# Patient Record
Sex: Male | Born: 1950 | Race: Black or African American | Hispanic: No | Marital: Single | Smoking: Former smoker
Health system: Southern US, Community
[De-identification: ages and names within clinical notes are randomized; demographics above are authoritative.]

## PROBLEM LIST (undated history)

## (undated) DIAGNOSIS — J189 Pneumonia, unspecified organism: Secondary | ICD-10-CM

## (undated) DIAGNOSIS — D75A Glucose-6-phosphate dehydrogenase (G6PD) deficiency without anemia: Secondary | ICD-10-CM

## (undated) DIAGNOSIS — Z8679 Personal history of other diseases of the circulatory system: Secondary | ICD-10-CM

## (undated) DIAGNOSIS — R569 Unspecified convulsions: Secondary | ICD-10-CM

## (undated) DIAGNOSIS — C3490 Malignant neoplasm of unspecified part of unspecified bronchus or lung: Secondary | ICD-10-CM

## (undated) DIAGNOSIS — I4510 Unspecified right bundle-branch block: Secondary | ICD-10-CM

## (undated) HISTORY — PX: APPENDECTOMY: SHX54

## (undated) HISTORY — DX: Personal history of other diseases of the circulatory system: Z86.79

## (undated) HISTORY — DX: Pneumonia, unspecified organism: J18.9

## (undated) HISTORY — DX: Unspecified convulsions: R56.9

## (undated) HISTORY — DX: Unspecified right bundle-branch block: I45.10

---

## 2005-06-06 ENCOUNTER — Emergency Department (HOSPITAL_COMMUNITY): Admission: EM | Admit: 2005-06-06 | Discharge: 2005-06-06 | Payer: Self-pay | Admitting: *Deleted

## 2005-11-09 ENCOUNTER — Ambulatory Visit: Payer: Self-pay | Admitting: Internal Medicine

## 2005-11-09 ENCOUNTER — Emergency Department (HOSPITAL_COMMUNITY): Admission: EM | Admit: 2005-11-09 | Discharge: 2005-11-09 | Payer: Self-pay | Admitting: Emergency Medicine

## 2005-11-14 ENCOUNTER — Ambulatory Visit (HOSPITAL_COMMUNITY): Admission: RE | Admit: 2005-11-14 | Discharge: 2005-11-14 | Payer: Self-pay | Admitting: Internal Medicine

## 2005-11-27 ENCOUNTER — Ambulatory Visit: Payer: Self-pay | Admitting: Internal Medicine

## 2005-12-15 ENCOUNTER — Emergency Department (HOSPITAL_COMMUNITY): Admission: EM | Admit: 2005-12-15 | Discharge: 2005-12-16 | Payer: Self-pay | Admitting: Emergency Medicine

## 2006-07-09 ENCOUNTER — Emergency Department (HOSPITAL_COMMUNITY): Admission: EM | Admit: 2006-07-09 | Discharge: 2006-07-09 | Payer: Self-pay | Admitting: Family Medicine

## 2006-07-18 ENCOUNTER — Emergency Department (HOSPITAL_COMMUNITY): Admission: EM | Admit: 2006-07-18 | Discharge: 2006-07-18 | Payer: Self-pay | Admitting: Emergency Medicine

## 2008-02-05 ENCOUNTER — Emergency Department (HOSPITAL_COMMUNITY): Admission: EM | Admit: 2008-02-05 | Discharge: 2008-02-05 | Payer: Self-pay | Admitting: Emergency Medicine

## 2008-03-17 ENCOUNTER — Emergency Department (HOSPITAL_COMMUNITY): Admission: EM | Admit: 2008-03-17 | Discharge: 2008-03-17 | Payer: Self-pay | Admitting: Emergency Medicine

## 2008-04-01 ENCOUNTER — Emergency Department (HOSPITAL_COMMUNITY): Admission: EM | Admit: 2008-04-01 | Discharge: 2008-04-01 | Payer: Self-pay | Admitting: Family Medicine

## 2008-05-12 HISTORY — PX: US ECHOCARDIOGRAPHY: HXRAD669

## 2009-10-25 ENCOUNTER — Emergency Department (HOSPITAL_COMMUNITY): Admission: EM | Admit: 2009-10-25 | Discharge: 2009-10-25 | Payer: Self-pay | Admitting: Emergency Medicine

## 2009-11-18 ENCOUNTER — Emergency Department (HOSPITAL_COMMUNITY): Admission: EM | Admit: 2009-11-18 | Discharge: 2009-11-18 | Payer: Self-pay | Admitting: Emergency Medicine

## 2010-09-03 ENCOUNTER — Emergency Department (HOSPITAL_COMMUNITY): Admission: EM | Admit: 2010-09-03 | Discharge: 2010-09-03 | Payer: Self-pay | Admitting: Emergency Medicine

## 2010-11-13 ENCOUNTER — Emergency Department (HOSPITAL_COMMUNITY)
Admission: EM | Admit: 2010-11-13 | Discharge: 2010-11-13 | Payer: Self-pay | Source: Home / Self Care | Admitting: Emergency Medicine

## 2011-01-10 LAB — DIFFERENTIAL
Basophils Absolute: 0 10*3/uL (ref 0.0–0.1)
Basophils Relative: 0 % (ref 0–1)
Eosinophils Absolute: 0.1 10*3/uL (ref 0.0–0.7)
Neutro Abs: 3.4 10*3/uL (ref 1.7–7.7)
Neutrophils Relative %: 57 % (ref 43–77)

## 2011-01-10 LAB — CULTURE, ROUTINE-ABSCESS

## 2011-01-10 LAB — CBC
MCH: 34.4 pg — ABNORMAL HIGH (ref 26.0–34.0)
MCHC: 33.9 g/dL (ref 30.0–36.0)
Platelets: 165 10*3/uL (ref 150–400)

## 2011-01-10 LAB — URINALYSIS, ROUTINE W REFLEX MICROSCOPIC
Nitrite: NEGATIVE
Protein, ur: NEGATIVE mg/dL
Urobilinogen, UA: 2 mg/dL — ABNORMAL HIGH (ref 0.0–1.0)

## 2011-01-10 LAB — BASIC METABOLIC PANEL
CO2: 23 mEq/L (ref 19–32)
Calcium: 9.3 mg/dL (ref 8.4–10.5)
Creatinine, Ser: 0.97 mg/dL (ref 0.4–1.5)
GFR calc Af Amer: >60 mL/min (ref >60)
GFR calc non Af Amer: >60 mL/min (ref >60)
Glucose, Bld: 108 mg/dL — ABNORMAL HIGH (ref 70–99)

## 2011-01-10 LAB — URINE MICROSCOPIC-ADD ON

## 2011-02-23 ENCOUNTER — Emergency Department (HOSPITAL_COMMUNITY): Payer: Medicaid Other

## 2011-02-23 ENCOUNTER — Observation Stay (HOSPITAL_COMMUNITY)
Admission: EM | Admit: 2011-02-23 | Discharge: 2011-02-24 | Discharge status: Against Medical Advice | Payer: Medicaid Other | Source: Ambulatory Visit | Attending: Internal Medicine | Admitting: Internal Medicine

## 2011-02-23 DIAGNOSIS — E538 Deficiency of other specified B group vitamins: Secondary | ICD-10-CM | POA: Insufficient documentation

## 2011-02-23 DIAGNOSIS — K219 Gastro-esophageal reflux disease without esophagitis: Secondary | ICD-10-CM | POA: Insufficient documentation

## 2011-02-23 DIAGNOSIS — R112 Nausea with vomiting, unspecified: Secondary | ICD-10-CM | POA: Insufficient documentation

## 2011-02-23 DIAGNOSIS — R0789 Other chest pain: Principal | ICD-10-CM | POA: Insufficient documentation

## 2011-02-23 DIAGNOSIS — F1210 Cannabis abuse, uncomplicated: Secondary | ICD-10-CM | POA: Insufficient documentation

## 2011-02-23 DIAGNOSIS — E7400 Glycogen storage disease, unspecified: Secondary | ICD-10-CM | POA: Insufficient documentation

## 2011-02-23 DIAGNOSIS — M25519 Pain in unspecified shoulder: Secondary | ICD-10-CM | POA: Insufficient documentation

## 2011-02-23 DIAGNOSIS — F17200 Nicotine dependence, unspecified, uncomplicated: Secondary | ICD-10-CM | POA: Insufficient documentation

## 2011-02-23 LAB — LIPID PANEL
Cholesterol: 130 mg/dL (ref 0–200)
HDL: 26 mg/dL — ABNORMAL LOW (ref >39)
Triglycerides: 123 mg/dL (ref <150)

## 2011-02-23 LAB — DIFFERENTIAL
Basophils Absolute: 0 10*3/uL (ref 0.0–0.1)
Basophils Relative: 1 % (ref 0–1)
Monocytes Absolute: 0.5 10*3/uL (ref 0.1–1.0)
Neutro Abs: 3.6 10*3/uL (ref 1.7–7.7)

## 2011-02-23 LAB — COMPREHENSIVE METABOLIC PANEL
BUN: 11 mg/dL (ref 6–23)
Calcium: 9.2 mg/dL (ref 8.4–10.5)
Glucose, Bld: 109 mg/dL — ABNORMAL HIGH (ref 70–99)
Sodium: 136 mEq/L (ref 135–145)
Total Protein: 7.1 g/dL (ref 6.0–8.3)

## 2011-02-23 LAB — RAPID URINE DRUG SCREEN, HOSP PERFORMED
Amphetamines: NOT DETECTED
Benzodiazepines: NOT DETECTED
Cocaine: NOT DETECTED
Opiates: NOT DETECTED
Tetrahydrocannabinol: POSITIVE — AB

## 2011-02-23 LAB — CBC
Hemoglobin: 13.1 g/dL (ref 13.0–17.0)
MCHC: 34.3 g/dL (ref 30.0–36.0)
RDW: 12.5 % (ref 11.5–15.5)

## 2011-02-23 LAB — CARDIAC PANEL(CRET KIN+CKTOT+MB+TROPI)
CK, MB: 1.6 ng/mL (ref 0.3–4.0)
CK, MB: 1.5 ng/mL (ref 0.3–4.0)
Relative Index: INVALID (ref 0.0–2.5)

## 2011-02-23 LAB — POCT CARDIAC MARKERS
CKMB, poc: 1.8 ng/mL (ref 1.0–8.0)
Myoglobin, poc: 58.1 ng/mL (ref 12–200)
Troponin i, poc: <0.05 ng/mL (ref 0.00–0.09)

## 2011-02-23 LAB — URINALYSIS, ROUTINE W REFLEX MICROSCOPIC
Bilirubin Urine: NEGATIVE
Glucose, UA: NEGATIVE mg/dL
Ketones, ur: NEGATIVE mg/dL
pH: 8 (ref 5.0–8.0)

## 2011-02-23 LAB — PROTIME-INR
INR: 1.03 (ref 0.00–1.49)
Prothrombin Time: 13.7 seconds (ref 11.6–15.2)

## 2011-02-23 LAB — URINE MICROSCOPIC-ADD ON

## 2011-02-24 ENCOUNTER — Inpatient Hospital Stay (HOSPITAL_COMMUNITY): Payer: Medicaid Other

## 2011-02-24 LAB — URINE CULTURE: Culture  Setup Time: 201204261401

## 2011-02-24 LAB — CARDIAC PANEL(CRET KIN+CKTOT+MB+TROPI)
CK, MB: 1.2 ng/mL (ref 0.3–4.0)
Total CK: 82 U/L (ref 7–232)

## 2011-02-24 MED ORDER — TECHNETIUM TC 99M TETROFOSMIN IV KIT
10.0000 | PACK | Freq: Once | INTRAVENOUS | Status: AC | PRN
  Administered 2011-02-24: 10 via INTRAVENOUS

## 2011-02-24 MED ORDER — TECHNETIUM TC 99M TETROFOSMIN IV KIT
30.0000 | PACK | Freq: Once | INTRAVENOUS | Status: AC | PRN
  Administered 2011-02-24: 30 via INTRAVENOUS

## 2011-02-25 ENCOUNTER — Inpatient Hospital Stay (HOSPITAL_COMMUNITY): Payer: Medicaid Other

## 2011-03-01 NOTE — H&P (Signed)
Joshua, White                  ACCOUNT NO.:  [id number]  MEDICAL RECORD NO.:  [id number]           PATIENT TYPE:  I  LOCATION:  3705                         FACILITY:  [facility]  PHYSICIAN:  Wilson Singer, M.D.DATE OF BIRTH:  09/30/1951  DATE OF ADMISSION:  02/23/2011 DATE OF DISCHARGE:                             HISTORY & PHYSICAL   CHIEF COMPLAINT:  Left-sided chest tightness.  HISTORY OF PRESENT ILLNESS:  This 60 year old man comes to the emergency room with approximately a 4-hour history of left-sided chest tightness associated with nausea and sweating.  He also complained of left arm pain and left shoulder pain.  The left shoulder pain he actually has had for the last 2 or 3 days and he relates this to possible injury to the left shoulder, but he is not completely certain of this.  He does not have a previous history of coronary artery disease.  He does have a strong family history of early coronary artery disease with his father having died from myocardial infarction at the age of 42.  He is a smoker.  He does not know where his cholesterol is.  He is not diabetic. Currently, he is pain free after he was given nitroglycerin.  Emergency room has asked [place] to evaluate him further.  PAST MEDICAL HISTORY: 1. G6PD deficiency. 2. Gastroesophageal reflux disease. 3. Vitamin B12 deficiency.  MEDICATIONS:  He is on omeprazole 40 mg daily, vitamin C 1 tablet daily, vitamin B12 one tablet daily, multivitamins 1 tablet daily, fish oil 1 capsule three times a day.  ALLERGIES:  No known drug allergies.  PAST SURGICAL HISTORY:  Appendectomy.  SOCIAL HISTORY:  The patient is single and lives alone.  He smokes 4 cigarettes a day and he is trying to quit.  He also has a history of cannabis abuse and he admits to this freely.  He does not drink alcohol. He has been on disability from his G6PD deficiency for many years.  FAMILY HISTORY:  Father died of a myocardial infarction at  the age of 77.  He has a brother who has a pacemaker inserted, but he is not sure whether he did have any coronary artery disease.  REVIEW OF SYSTEMS:  Apart from the symptoms mentioned above, there are no other symptoms referable to all systems reviewed.  PHYSICAL EXAMINATION:  VITAL SIGNS:  Temperature 97.9, blood pressure 142/99, pulse 75, saturation 100% on room air, respiratory rate 12 to 14. GENERAL:  He looks systemically well.  He is not in any acute distress. There is no peripheral or central cyanosis.  There is no clubbing. There is no jaundice. CARDIOVASCULAR:  Heart sounds are present and normal.  There is no gallop rhythm. LUNGS:  Lung fields are clear without any pericardial or pleural rub. ABDOMEN:  Soft and nontender with no hepatosplenomegaly. NEUROLOGICAL:  He is alert and oriented without any focal neurologic signs. SKIN:  There are no obvious skin lesions or rash.  INVESTIGATIONS:  Chest x-ray is largely negative with no evidence of acute cardiopulmonary disease.  Hemoglobin 13.1 with an MCV of 100.8, white blood cell  count 5.6, platelets 143.  Sodium 136, potassium 4.2, bicarbonate 27, BUN 11, creatinine 0.91, INR 1.03.  Urinalysis shows small amount of leukocytes and negative for nitrites.  Initial cardiac markers x2 have been negative.  PROBLEM LIST: 1. Atypical chest tightness/pain. 2. Left shoulder pain. 3. G6PD deficiency. 4. Strong family history of early coronary artery disease. 5. Tobacco and cannabis abuse.  PLAN: 1. Admit to telemetry. 2. Serial cardiac enzymes and ECG. 3. Cardiology consultation. 4. X-ray of the left shoulder 5. Urine drug screen.  I have spoken with Dr. Sharyn Lull, cardiologist who will see him in due course and further recommendations will depend on hospital progress.     Wilson Singer, M.D.     NCG/MEDQ  D:  02/23/2011  T:  02/24/2011  Job:  [id number]  cc:   Maryelizabeth Rowan, M.D. Eduardo Osier. Sharyn Lull,  M.D.  Electronically Signed by Lilly Cove M.D. on 03/01/2011 12:22:33 PM

## 2011-03-22 NOTE — Discharge Summary (Signed)
  NAMECLEBURN, MAIOLO                  ACCOUNT NO.:  [id number]  MEDICAL RECORD NO.:  [id number]           PATIENT TYPE:  I  LOCATION:  3705                         FACILITY:  [facility]  PHYSICIAN:  Hobie Kohles I Micholas Drumwright, MD      DATE OF BIRTH:  08/10/1951  DATE OF ADMISSION:  02/23/2011 DATE OF DISCHARGE:  02/24/2011                              DISCHARGE SUMMARY   DIAGNOSES: 1. Atypical chest pain. 2. History of G6PD deficiency. 3. Gastroesophageal reflux deficiency. 4. Vitamin B12 deficiency.  MEDICATIONS:  The patient signed AMA before the official discharge.  PROCEDURES: 1. Stress test.  No evidence of pharmacological-induced ischemia.     Ejection fraction 58%.  Shoulder x-ray, no evidence of fracture or     dislocation. 2. Chest x-ray low volume without acute cardiopulmonary disease.  HISTORY OF PRESENT ILLNESS:  This 60 year old male presented to the emergency room with approximately 4 hour history of left-sided chest tightness associated with nausea and sweating.  He had complained of left arm pain and left shoulder pain.  The left shoulder pain has actually he had for the last 2-3 days and he had related this also to injury to his left shoulder.  In the emergency room, the patient has vital sign of 97.7, blood pressure 142 and pulse rate 75.  Chest x-ray no evidence of acute cardiopulmonary process.  The patient admitted to telemetry and had Cardiology consult done by Dr. Sharyn Lull who had recommended stress test.  The patient underwent a stress test, report as where mentioned negative.  Actually, the patient signed against medical advice before official discharge.     Bence Trapp Bosie Helper, MD     HIE/MEDQ  D:  02/26/2011  T:  02/26/2011  Job:  [id number]  Electronically Signed by Ebony Cargo MD on 03/22/2011 03:53:19 PM

## 2011-03-31 ENCOUNTER — Emergency Department (HOSPITAL_COMMUNITY)
Admission: EM | Admit: 2011-03-31 | Discharge: 2011-03-31 | Disposition: A | Discharge status: Home / Self Care | Payer: Medicaid Other | Attending: Emergency Medicine | Admitting: Emergency Medicine

## 2011-03-31 DIAGNOSIS — Z9889 Other specified postprocedural states: Secondary | ICD-10-CM | POA: Insufficient documentation

## 2011-03-31 DIAGNOSIS — L738 Other specified follicular disorders: Secondary | ICD-10-CM | POA: Insufficient documentation

## 2011-03-31 DIAGNOSIS — M79609 Pain in unspecified limb: Secondary | ICD-10-CM | POA: Insufficient documentation

## 2011-03-31 DIAGNOSIS — M7989 Other specified soft tissue disorders: Secondary | ICD-10-CM | POA: Insufficient documentation

## 2011-03-31 DIAGNOSIS — Z79899 Other long term (current) drug therapy: Secondary | ICD-10-CM | POA: Insufficient documentation

## 2011-03-31 DIAGNOSIS — D551 Anemia due to other disorders of glutathione metabolism: Secondary | ICD-10-CM | POA: Insufficient documentation

## 2011-03-31 DIAGNOSIS — K219 Gastro-esophageal reflux disease without esophagitis: Secondary | ICD-10-CM | POA: Insufficient documentation

## 2011-04-11 ENCOUNTER — Ambulatory Visit: Payer: Medicaid Other | Attending: Family Medicine | Admitting: Physical Therapy

## 2011-04-11 DIAGNOSIS — R5381 Other malaise: Secondary | ICD-10-CM | POA: Insufficient documentation

## 2011-04-11 DIAGNOSIS — M25519 Pain in unspecified shoulder: Secondary | ICD-10-CM | POA: Insufficient documentation

## 2011-04-11 DIAGNOSIS — IMO0001 Reserved for inherently not codable concepts without codable children: Secondary | ICD-10-CM | POA: Insufficient documentation

## 2011-04-11 DIAGNOSIS — M25619 Stiffness of unspecified shoulder, not elsewhere classified: Secondary | ICD-10-CM | POA: Insufficient documentation

## 2011-04-25 ENCOUNTER — Observation Stay (HOSPITAL_COMMUNITY)
Admission: EM | Admit: 2011-04-25 | Discharge: 2011-04-25 | Disposition: A | Discharge status: Home / Self Care | Payer: Medicaid Other | Source: Ambulatory Visit | Attending: Emergency Medicine | Admitting: Emergency Medicine

## 2011-04-25 DIAGNOSIS — T781XXA Other adverse food reactions, not elsewhere classified, initial encounter: Principal | ICD-10-CM | POA: Insufficient documentation

## 2011-04-25 DIAGNOSIS — Z91011 Allergy to milk products: Secondary | ICD-10-CM | POA: Insufficient documentation

## 2011-04-25 DIAGNOSIS — R0602 Shortness of breath: Secondary | ICD-10-CM | POA: Insufficient documentation

## 2011-04-26 ENCOUNTER — Ambulatory Visit: Payer: Medicaid Other | Admitting: Physical Therapy

## 2011-05-10 ENCOUNTER — Ambulatory Visit: Payer: Medicaid Other | Attending: Family Medicine | Admitting: Physical Therapy

## 2011-05-10 DIAGNOSIS — M25519 Pain in unspecified shoulder: Secondary | ICD-10-CM | POA: Insufficient documentation

## 2011-05-10 DIAGNOSIS — M25619 Stiffness of unspecified shoulder, not elsewhere classified: Secondary | ICD-10-CM | POA: Insufficient documentation

## 2011-05-10 DIAGNOSIS — IMO0001 Reserved for inherently not codable concepts without codable children: Secondary | ICD-10-CM | POA: Insufficient documentation

## 2011-05-10 DIAGNOSIS — R5381 Other malaise: Secondary | ICD-10-CM | POA: Insufficient documentation

## 2011-05-17 ENCOUNTER — Ambulatory Visit: Payer: Medicaid Other | Admitting: Physical Therapy

## 2011-05-24 ENCOUNTER — Encounter: Payer: Medicaid Other | Admitting: Physical Therapy

## 2011-07-26 LAB — URINALYSIS, ROUTINE W REFLEX MICROSCOPIC
Ketones, ur: NEGATIVE
Nitrite: NEGATIVE
Protein, ur: NEGATIVE
Urobilinogen, UA: 1

## 2011-07-26 LAB — HEPATIC FUNCTION PANEL
Alkaline Phosphatase: 70
Bilirubin, Direct: 0.3
Indirect Bilirubin: 1.3 — ABNORMAL HIGH
Total Bilirubin: 1.6 — ABNORMAL HIGH
Total Protein: 7.2

## 2011-07-26 LAB — POCT I-STAT, CHEM 8
BUN: 14
Calcium, Ion: 0.96 — ABNORMAL LOW
Chloride: 104
Glucose, Bld: 97

## 2011-07-26 LAB — CBC
HCT: 40.3
MCV: 103.1 — ABNORMAL HIGH
Platelets: 161
WBC: 6.4

## 2011-07-26 LAB — DIFFERENTIAL
Eosinophils Absolute: 0.1
Eosinophils Relative: 1
Lymphs Abs: 1.7

## 2011-09-13 ENCOUNTER — Encounter: Payer: Self-pay | Admitting: *Deleted

## 2011-09-13 ENCOUNTER — Emergency Department (HOSPITAL_COMMUNITY)
Admission: EM | Admit: 2011-09-13 | Discharge: 2011-09-13 | Discharge status: Against Medical Advice | Payer: Medicaid Other | Attending: Emergency Medicine | Admitting: Emergency Medicine

## 2011-09-13 DIAGNOSIS — R112 Nausea with vomiting, unspecified: Secondary | ICD-10-CM | POA: Insufficient documentation

## 2011-09-13 DIAGNOSIS — R197 Diarrhea, unspecified: Secondary | ICD-10-CM | POA: Insufficient documentation

## 2011-09-13 HISTORY — DX: Glucose-6-phosphate dehydrogenase (G6PD) deficiency without anemia: D75.A

## 2011-09-13 NOTE — ED Notes (Signed)
Pt reports not eating, n/v./d x 4 days. Also having abcess x 2 which is causing pain.

## 2012-01-08 ENCOUNTER — Encounter: Payer: Self-pay | Admitting: *Deleted

## 2012-01-08 ENCOUNTER — Other Ambulatory Visit: Payer: Self-pay | Admitting: *Deleted

## 2012-11-27 ENCOUNTER — Other Ambulatory Visit (HOSPITAL_COMMUNITY): Payer: Self-pay | Admitting: Family Medicine

## 2012-11-27 DIAGNOSIS — R55 Syncope and collapse: Secondary | ICD-10-CM

## 2012-12-05 ENCOUNTER — Ambulatory Visit (HOSPITAL_COMMUNITY)
Admission: RE | Admit: 2012-12-05 | Discharge: 2012-12-05 | Disposition: A | Discharge status: Home / Self Care | Payer: Medicaid Other | Source: Ambulatory Visit | Attending: Cardiovascular Disease | Admitting: Cardiovascular Disease

## 2012-12-05 DIAGNOSIS — R55 Syncope and collapse: Secondary | ICD-10-CM | POA: Insufficient documentation

## 2012-12-05 DIAGNOSIS — I369 Nonrheumatic tricuspid valve disorder, unspecified: Secondary | ICD-10-CM | POA: Insufficient documentation

## 2012-12-05 DIAGNOSIS — I059 Rheumatic mitral valve disease, unspecified: Secondary | ICD-10-CM | POA: Insufficient documentation

## 2012-12-05 NOTE — Progress Notes (Signed)
2D Echo Performed 12/05/2012    Evely Gainey, RCS  

## 2012-12-05 NOTE — Progress Notes (Signed)
Carotid Duplex Imaging Complete Vickie Ponds 

## 2012-12-06 NOTE — Progress Notes (Signed)
Carotid Duplex Completed. 

## 2013-01-31 ENCOUNTER — Encounter: Payer: Self-pay | Admitting: Cardiology

## 2013-05-05 ENCOUNTER — Emergency Department (HOSPITAL_COMMUNITY)
Admission: EM | Admit: 2013-05-05 | Discharge: 2013-05-05 | Discharge status: Against Medical Advice | Payer: Medicaid Other | Attending: Emergency Medicine | Admitting: Emergency Medicine

## 2013-05-05 DIAGNOSIS — J3489 Other specified disorders of nose and nasal sinuses: Secondary | ICD-10-CM | POA: Insufficient documentation

## 2013-05-05 DIAGNOSIS — H9209 Otalgia, unspecified ear: Secondary | ICD-10-CM | POA: Insufficient documentation

## 2013-05-05 DIAGNOSIS — R234 Changes in skin texture: Secondary | ICD-10-CM | POA: Insufficient documentation

## 2013-05-05 DIAGNOSIS — IMO0002 Reserved for concepts with insufficient information to code with codable children: Secondary | ICD-10-CM | POA: Insufficient documentation

## 2013-05-05 NOTE — ED Notes (Signed)
Presents with left posterior forearm induration that began 4 days ago. Pt also reports having flu like symptoms the last 4 days as well. He reports right ear pain and radiation to right neck and right eye and sinus pressure. Pt is alert and oriented.

## 2013-06-23 ENCOUNTER — Ambulatory Visit
Admission: RE | Admit: 2013-06-23 | Discharge: 2013-06-23 | Disposition: A | Discharge status: Home / Self Care | Payer: Medicaid Other | Source: Ambulatory Visit | Attending: Family Medicine | Admitting: Family Medicine

## 2013-06-23 ENCOUNTER — Other Ambulatory Visit: Payer: Self-pay | Admitting: Family Medicine

## 2013-06-23 DIAGNOSIS — M25511 Pain in right shoulder: Secondary | ICD-10-CM

## 2014-05-29 ENCOUNTER — Emergency Department (HOSPITAL_COMMUNITY)
Admission: EM | Admit: 2014-05-29 | Discharge: 2014-05-29 | Discharge status: Against Medical Advice | Payer: Medicaid Other | Attending: Emergency Medicine | Admitting: Emergency Medicine

## 2014-05-29 ENCOUNTER — Encounter (HOSPITAL_COMMUNITY): Payer: Self-pay | Admitting: Emergency Medicine

## 2014-05-29 DIAGNOSIS — Y9289 Other specified places as the place of occurrence of the external cause: Secondary | ICD-10-CM | POA: Diagnosis not present

## 2014-05-29 DIAGNOSIS — IMO0002 Reserved for concepts with insufficient information to code with codable children: Secondary | ICD-10-CM | POA: Diagnosis not present

## 2014-05-29 DIAGNOSIS — Z862 Personal history of diseases of the blood and blood-forming organs and certain disorders involving the immune mechanism: Secondary | ICD-10-CM | POA: Diagnosis not present

## 2014-05-29 DIAGNOSIS — R51 Headache: Secondary | ICD-10-CM | POA: Insufficient documentation

## 2014-05-29 DIAGNOSIS — Z8669 Personal history of other diseases of the nervous system and sense organs: Secondary | ICD-10-CM | POA: Diagnosis not present

## 2014-05-29 DIAGNOSIS — L089 Local infection of the skin and subcutaneous tissue, unspecified: Secondary | ICD-10-CM

## 2014-05-29 DIAGNOSIS — Y9389 Activity, other specified: Secondary | ICD-10-CM | POA: Insufficient documentation

## 2014-05-29 DIAGNOSIS — Z79899 Other long term (current) drug therapy: Secondary | ICD-10-CM | POA: Insufficient documentation

## 2014-05-29 DIAGNOSIS — S0100XA Unspecified open wound of scalp, initial encounter: Secondary | ICD-10-CM | POA: Insufficient documentation

## 2014-05-29 DIAGNOSIS — I951 Orthostatic hypotension: Secondary | ICD-10-CM | POA: Diagnosis not present

## 2014-05-29 DIAGNOSIS — R519 Headache, unspecified: Secondary | ICD-10-CM

## 2014-05-29 DIAGNOSIS — Z23 Encounter for immunization: Secondary | ICD-10-CM | POA: Diagnosis not present

## 2014-05-29 DIAGNOSIS — S0990XA Unspecified injury of head, initial encounter: Secondary | ICD-10-CM | POA: Diagnosis not present

## 2014-05-29 DIAGNOSIS — Z8701 Personal history of pneumonia (recurrent): Secondary | ICD-10-CM | POA: Diagnosis not present

## 2014-05-29 DIAGNOSIS — T148XXA Other injury of unspecified body region, initial encounter: Secondary | ICD-10-CM

## 2014-05-29 MED ORDER — METOCLOPRAMIDE HCL 5 MG/ML IJ SOLN
10.0000 mg | Freq: Once | INTRAMUSCULAR | Status: DC
  Filled 2014-05-29: qty 2

## 2014-05-29 MED ORDER — DIPHENHYDRAMINE HCL 50 MG/ML IJ SOLN
25.0000 mg | Freq: Once | INTRAMUSCULAR | Status: DC
  Filled 2014-05-29: qty 1

## 2014-05-29 MED ORDER — CEPHALEXIN 500 MG PO CAPS
500.0000 mg | ORAL_CAPSULE | Freq: Four times a day (QID) | ORAL | Status: DC
Start: 2014-05-29 — End: 2016-10-17

## 2014-05-29 MED ORDER — NAPROXEN 500 MG PO TABS
500.0000 mg | ORAL_TABLET | Freq: Two times a day (BID) | ORAL | Status: DC
Start: 2014-05-29 — End: 2016-12-26

## 2014-05-29 MED ORDER — CEFTRIAXONE SODIUM 1 G IJ SOLR
1.0000 g | Freq: Once | INTRAMUSCULAR | Status: DC
  Filled 2014-05-29: qty 10

## 2014-05-29 MED ORDER — SODIUM CHLORIDE 0.9 % IV BOLUS (SEPSIS)
1000.0000 mL | Freq: Once | INTRAVENOUS | Status: AC
  Administered 2014-05-29: 1000 mL via INTRAVENOUS

## 2014-05-29 MED ORDER — TETANUS-DIPHTH-ACELL PERTUSSIS 5-2.5-18.5 LF-MCG/0.5 IM SUSP
0.5000 mL | Freq: Once | INTRAMUSCULAR | Status: AC
  Administered 2014-05-29: 0.5 mL via INTRAMUSCULAR
  Filled 2014-05-29: qty 0.5

## 2014-05-29 NOTE — ED Provider Notes (Signed)
Medical screening examination/treatment/procedure(s) were performed by non-physician practitioner and as supervising physician I was immediately available for consultation/collaboration.   EKG Interpretation None        Blanchard Kelch, MD 05/29/14 [phone]

## 2014-05-29 NOTE — ED Notes (Signed)
Pt leaving AMA, will not give reason, just states he wants to go. PA explained risks of leaving and pt verbalized understanding.

## 2014-05-29 NOTE — Discharge Instructions (Signed)
Your leaving the emergency room [place]. As we have discussed this may result in your death or permanent disability. You can change her mind at any time and call 911 or return to the emergency room. Please take the antibiotics as directed. Please follow closely with your primary care provider.  If you see signs of worsening infection (warmth, redness, tenderness, pus, sharp increase in pain, fever, red streaking) immediately return to the emergency department.   Emergency Department Resource Guide 1) Find a Doctor and Pay Out of Pocket Although you won't have to find out who is covered by your insurance plan, it is a good idea to ask around and get recommendations. You will then need to call the office and see if the doctor you have chosen will accept you as a new patient and what types of options they offer for patients who are self-pay. Some doctors offer discounts or will set up payment plans for their patients who do not have insurance, but you will need to ask so you aren't surprised when you get to your appointment.  2) Contact Your Local Health Department Not all health departments have doctors that can see patients for sick visits, but many do, so it is worth a call to see if yours does. If you don't know where your local health department is, you can check in your phone book. The CDC also has a tool to help you locate your state's health department, and many state websites also have listings of all of their local health departments.  3) Find a [facility] If your illness is not likely to be very severe or complicated, you may want to try a walk in clinic. These are popping up all over the country in pharmacies, drugstores, and shopping centers. They're usually staffed by nurse practitioners or physician assistants that have been trained to treat common illnesses and complaints. They're usually fairly quick and inexpensive. However, if you have serious medical issues or chronic  medical problems, these are probably not your best option.  No Primary Care Doctor: - Call Health Connect at  [phone] - they can help you locate a primary care doctor that  accepts your insurance, provides certain services, etc. - Physician Referral Service- [phone]  Chronic Pain Problems: Organization         Address  Phone   Notes  [facility]  [phone] Patients need to be referred by their primary care doctor.   Medication Assistance: Organization         Address  Phone   Notes  [facility] Medication [facility] [address] [place], [place] [address] [phone] --Must be a resident of [facility] -- Must have NO insurance coverage whatsoever (no Medicaid/ Medicare, etc.) -- The pt. MUST have a primary care doctor that directs their care regularly and follows them in the community   [facility]ist  [phone]   [employer]  [phone]    Agencies that provide inexpensive medical care: Organization         Address  Phone   Notes  [place]  [phone]   [facility]    [phone]   [facility] [place], [place] [address] [phone]   [place] [address] [place] [phone]   Planned Parenthood    [phone]   [facility]    [phone])  [place]  [address], [place] Phone:  [phone], Fax:  [phone] Hours of Operation:  9 am - 6 pm, M-F.  Also accepts Medicaid/Medicare and self-pay.  [facility] for [place], [address], [place] Phone: [phone], Fax: [phone]. Hours of Operation:  8:30 am - 5:30 pm, M-F.  Also accepts Medicaid and self-pay.  [facility] [address] [place] Phone: [phone]   [place], [place], [place] [phone],  Ext. [phone] Mondays & Thursdays: 7-9 AM.  First 15 patients are seen on a first come, first serve basis.    [place] Providers:  Organization         Address  Phone   Notes  [facility] [address], [place] [phone] Also accepts self-pay patients.  [facility] [address], [place]  [phone]   [place], [address], [place] [phone]   [facility] [address] [place] [phone]   Lucianne Lei [address], [place]   [phone] Only accepts [place] Access [place] patients after they have their name applied to their card.   Self-Pay (no insurance) in [facility]:  Organization         Address  Phone   Notes  Sickle Cell Patients, [facility] Internal [facility] [place] [phone]   [facility] [place] [phone]   [facility] [place]  [place], [place], [place] [phone]   [facility]/Dr. Osei-Bonsu  [address] [place] or [address], [place] [phone] Phone number for both [place] and [place] locations is the same.  Urgent Medical and [facility] [address] [place] [phone]   [facility] [address], [place] or [address] [phone]   [facility] [address], [place] [phone], phone; [phone], fax Sees patients 1st and 3rd Saturday of every month.  Must not qualify for public or private insurance (i.e. Medicaid, Medicare, [place] Health Choice, Veterans' Benefits)  Household income should be no more than 200% of the poverty level The clinic cannot treat you if you are pregnant or think you are pregnant  Sexually transmitted diseases are not  treated at the clinic.    Dental Care: Organization         Address  Phone  Notes  [facility] Department of [facility] [address] [phone] Accepts children up to age 80 who are enrolled in [place] or [place]; pregnant women with a Medicaid card; and children who have applied for Medicaid or [place] Health Choice, but were declined, whose parents can pay a reduced fee at time of service.  [facility] of [facility]  [address], [place] [phone] Accepts children up to age 33 who are enrolled in [place] or [place]; pregnant women with a Medicaid card; and children who have applied for Medicaid or [place] Health Choice, but were declined, whose parents can pay a reduced fee at time of service.  [place] Adult Dental Access  PROGRAM  [place] [phone] Patients are seen by appointment only. Walk-ins are not accepted. [place] will see patients 73 years of age and older. Monday - Tuesday (8am-5pm) Most Wednesdays (8:30-5pm) $30 per visit, cash only  [facility] Adult Dental Access PROGRAM  [address], [facility] [phone] Patients are seen by appointment only. Walk-ins are not accepted. [place] will see patients 72 years of age and older. One Wednesday Evening (Monthly: Volunteer Based).  $30 per visit, cash only  [place]  [phone] for adults; Children under age 17, call Graduate Pediatric Dentistry at [phone]. Children aged 23-14, please call [phone] to request a pediatric application.  Dental services are provided in all areas of dental care including fillings, crowns and bridges, complete and partial dentures, implants, gum treatment, root canals, and extractions. Preventive care is also provided. Treatment is provided to both adults and children. Patients are selected via a lottery and there is  often a waiting list.   [facility] [address], [place]  [phone] [email or web address]   [facility] [address] [place], [place] [phone], Ext. [phone] Second and Fourth Thursday of each month, opens at 6:30 AM; Clinic ends at 9 AM.  Patients are seen on a first-come first-served basis, and a limited number are seen during each clinic.   [facility]  [address] Hillard Danker [place], [place] [phone]   Eligibility Requirements You must have lived in [place], [place], or [place] counties for at least the last three months.   You cannot be eligible for state or federal sponsored [employer], including [employer], [place], or [employer].   You generally cannot be eligible for healthcare insurance through your employer.    How to apply: Eligibility screenings are held every Tuesday and Wednesday afternoon from 1:00 pm until 4:00 pm. You do not need an appointment for the interview!  [facility] [address], [place], [place]   [place]  [place] Department  [place]  [phone]    Behavioral Health Resources in the Community: Intensive Outpatient Programs Organization         Address  Phone  Notes  [place]. [address], [place], [place] [phone]   [facility] Outpatient [address] [place], [place]   ADS: Alcohol & Drug Svcs [address], [place], [place]   [place] [address],  [place], [place] or [phone]   Substance Abuse Resources Organization         Address  Phone  Notes  Alcohol and Drug Services  [phone]   [place]  2062811045   [facility]   Chinita Pester  [phone]   Residential & Outpatient Substance  Abuse Program  [phone]   Psychological Services Organization         Address  Phone  Notes  [facility]  [place]  [phone]   [place] [address] [place] or [phone]    Mobile Crisis Teams Organization         Address  Phone  Notes  [facility], Mobile Crisis Care Unit  [phone]   [facility]  [address] [place], [place]  [place], [place] [phone]    Self-Help/Support Groups Organization         Address  Phone             Notes  [facility]ental Health [facility]oc[facility] of [place] - variety of support groups  [place] Call for more information  Narcotics Anonymous (NA), [facility] [address], [employer] [place]  2 meetings at this location   Special educational needs teacher         Address  Phone  Notes  [facility] [place],    [place]  [phone]   [facility]  [address] [place] [phone], [place], [place]   [place], [place] [phone] Admissions: 8am-3pm M-F  Incentives Substance [place] [address],    [employer], [place] [phone]   [facility] [address] [place], [place], [place]   [facility] [address]  [place], [place]   [facility] - Intensive Outpatient [address], [place], [place]   [facility] [place].,  [place], [place] [phone] or [phone]   [facility] Services (RTS) [address], [place], [place] Accepts Medicaid  [facility] [place] [address],  [employer] [place] [phone] Substance Abuse/Addiction Treatment   [facility] Organization          Address  Phone  Notes  [facility]  [phone]   Domenic Schwab, PhD [address] Arlis Porta [place], [place]   [phone] or [phone]   [place], [place] [phone]   [facility] [address] [place], [place] [phone] Insurance/Medicaid/sponsorship through [facility] and Families [address] [place]                                    [place], [place] [phone] [place] [address], [place] [phone]    Dr. Adele Schilder  [phone]   [facility] Dept. 1) [address] [place] 2) [place] 3)  [address], [place] [phone]  [phone]   [place] [phone] or [phone] (After Hours)

## 2014-05-29 NOTE — ED Provider Notes (Signed)
CSN: [id number]     Arrival date & time 05/29/14  2094 History   First MD Initiated Contact with Patient 05/29/14 [phone]     Chief Complaint  Patient presents with  . Headache     (Consider location/radiation/quality/duration/timing/severity/associated sxs/prior Treatment) Patient is a 63 y.o. male presenting with headaches.  Headache   Joshua White is a 63 y.o. male with past medical history significant for G6PD deficiency, seizures, right bundle branch block complaining of severe headache. Patient hit his head on a 2 weeks ago, there was no loss of consciousness nausea vomiting, change in vision or any acute changes at that time. Since then the headache is worsened and there has been swelling to the affected area. He's been applying hydrogen peroxide with little relief. There is a purulent discharge from the laceration. Last tetanus shot is unknown, patient denies fever, chills, nausea, vomiting, change in vision, unilateral weakness, difficulty ambulating. He endorses a mild fatigue.  Past Medical History  Diagnosis Date  . G6PD deficiency   . Seizures   . Pneumonia   . RBBB (right bundle branch block)   . H/O orthostatic hypotension    Past Surgical History  Procedure Laterality Date  . Appendectomy    . US echocardiography  05/12/2008    EF 55-60%   Family History  Problem Relation Age of Onset  . Lung cancer Mother   . Brain cancer Mother   . Heart disease Father   . Hypertension Father   . Heart attack Father    History  Substance Use Topics  . Smoking status: Never Smoker   . Smokeless tobacco: Not on file  . Alcohol Use: No    Review of Systems  Neurological: Positive for headaches.    10 systems reviewed and found to be negative, except as noted in the HPI.   Allergies  Sulfa antibiotics  Home Medications   Prior to Admission medications   Medication Sig Start Date End Date Taking? Authorizing Provider  OVER THE COUNTER MEDICATION Take 2 capsules by  mouth 2 (two) times daily as needed (for pain. OTC Gel capsules).   Yes Historical Provider, MD  cephALEXin (KEFLEX) 500 MG capsule Take 1 capsule (500 mg total) by mouth 4 (four) times daily. 05/29/14   Christin Moline, PA-C  naproxen (NAPROSYN) 500 MG tablet Take 1 tablet (500 mg total) by mouth 2 (two) times daily with a meal. 05/29/14   Jenilee Franey, PA-C   BP 150/81  Pulse 88  Temp(Src) 97.9 F (36.6 C) (Oral)  Resp 18  Ht 5\' 9"  (1.753 m)  Wt 155 lb (70.308 kg)  BMI 22.88 kg/m2  SpO2 100% Physical Exam  Nursing note and vitals reviewed. Constitutional: He is oriented to person, place, and time. He appears well-developed and well-nourished. No distress.  HENT:  Head: Normocephalic.    Mouth/Throat: Oropharynx is clear and moist.  Eyes: Conjunctivae and EOM are normal. Pupils are equal, round, and reactive to light.  Neck: Normal range of motion. Neck supple.  No midline C-spine  tenderness to palpation or step-offs appreciated. Patient has full range of motion without pain.   Cardiovascular: Normal rate, regular rhythm and intact distal pulses.   Pulmonary/Chest: Effort normal and breath sounds normal. No stridor. No respiratory distress. He has no wheezes. He has no rales. He exhibits no tenderness.  Abdominal: Soft. There is no tenderness.  Musculoskeletal: Normal range of motion.  Neurological: He is alert and oriented to person, place, and time. No  cranial nerve deficit.  Follows commands, Clear, goal oriented speech, Strength is 5 out of 5x4 extremities, patient ambulates with a coordinated in nonantalgic gait. Sensation is grossly intact.   Psychiatric: He has a normal mood and affect.    ED Course  Procedures (including critical care time) Labs Review Labs Reviewed - No data to display  Imaging Review No results found.   EKG Interpretation None      MDM   Final diagnoses:  Infected laceration  Head trauma, initial encounter  Intractable headache,  unspecified chronicity pattern, unspecified headache type    Filed Vitals:   05/29/14 0834  BP: 150/81  Pulse: 88  Temp: 97.9 F (36.6 C)  TempSrc: Oral  Resp: 18  Height: 5\' 9"  (1.753 m)  Weight: 155 lb (70.308 kg)  SpO2: 100%    Medications  Tdap (BOOSTRIX) injection 0.5 mL (0.5 mLs Intramuscular Given 05/29/14 0852)  sodium chloride 0.9 % bolus 1,000 mL (0 mLs Intravenous Stopped 05/29/14 1008)    DMETRIUS White is a 63 y.o. male presenting with infected laceration to scalp and severe headaches which are atypical for him. Neuro exam is nonfocal. Considering his age it is reasonable to get a head CT. Patient will also be given headache cocktail, Rocephin for his infection. I have called the pharmacy and verified that this should not be an issue with his G6 PD deficiency.  Patient has become increasingly agitated and refuses to stay for workup. I have tried to speak to him to find out why he is agitated he states he just wants to leave. He has a doctor and he will go see his doctor. I've asked him if it would be possible to just get a head CT and give him oral pain medications. Patient has refused. I have explained to him that leaving the emergency room with incomplete workup may result in death or permanent disability. Patient adamantly refuses to stay. I have advised him that he is welcome to return to the ED at any time for further workup. I've asked him if I could write him for medication to go home with. Patient has refused this but I have given him a prescription for Keflex and naproxen.  Evaluation does not show pathology that would require ongoing emergent intervention or inpatient treatment. Pt is hemodynamically stable and mentating appropriately. Discussed findings and plan with patient/guardian, who agrees with care plan. All questions answered. Return precautions discussed and outpatient follow up given.   Discharge Medication List as of 05/29/2014 10:04 AM    START taking these  medications   Details  cephALEXin (KEFLEX) 500 MG capsule Take 1 capsule (500 mg total) by mouth 4 (four) times daily., Starting 05/29/2014, Until Discontinued, Print    naproxen (NAPROSYN) 500 MG tablet Take 1 tablet (500 mg total) by mouth 2 (two) times daily with a meal., Starting 05/29/2014, Until Discontinued, [employer], PA-C 05/29/14 1054

## 2014-05-29 NOTE — ED Notes (Addendum)
Pt states he hit his head 3 weeks ago and 2 days ago he noticed swelling to the left side of his face and head which he was putting hydrogen peroxide on. States now is has a headache and is light headed and won't go away. Pt has a open wound to the posterior surface of his scalp that is tender to touch

## 2014-05-29 NOTE — ED Notes (Signed)
Elmyra Ricks, PA at the bedside. Pt stating he wants to go. Attempting to explain to pt the reason he should stay and get medicine and have his CT scan. Pt just stating he wants to go.

## 2014-05-29 NOTE — ED Notes (Signed)
Elmyra Ricks, PA at the bedside.

## 2014-08-03 ENCOUNTER — Emergency Department (HOSPITAL_COMMUNITY)
Admission: EM | Admit: 2014-08-03 | Discharge: 2014-08-03 | Disposition: A | Discharge status: Home / Self Care | Payer: Medicaid Other | Attending: Emergency Medicine | Admitting: Emergency Medicine

## 2014-08-03 ENCOUNTER — Encounter (HOSPITAL_COMMUNITY): Payer: Self-pay | Admitting: Emergency Medicine

## 2014-08-03 ENCOUNTER — Emergency Department (HOSPITAL_COMMUNITY): Payer: Medicaid Other

## 2014-08-03 DIAGNOSIS — Z720 Tobacco use: Secondary | ICD-10-CM | POA: Diagnosis not present

## 2014-08-03 DIAGNOSIS — Z862 Personal history of diseases of the blood and blood-forming organs and certain disorders involving the immune mechanism: Secondary | ICD-10-CM | POA: Insufficient documentation

## 2014-08-03 DIAGNOSIS — M778 Other enthesopathies, not elsewhere classified: Secondary | ICD-10-CM

## 2014-08-03 DIAGNOSIS — Z792 Long term (current) use of antibiotics: Secondary | ICD-10-CM | POA: Insufficient documentation

## 2014-08-03 DIAGNOSIS — Z8701 Personal history of pneumonia (recurrent): Secondary | ICD-10-CM | POA: Insufficient documentation

## 2014-08-03 DIAGNOSIS — Z791 Long term (current) use of non-steroidal anti-inflammatories (NSAID): Secondary | ICD-10-CM | POA: Insufficient documentation

## 2014-08-03 DIAGNOSIS — M65841 Other synovitis and tenosynovitis, right hand: Secondary | ICD-10-CM | POA: Insufficient documentation

## 2014-08-03 DIAGNOSIS — M25521 Pain in right elbow: Secondary | ICD-10-CM | POA: Diagnosis present

## 2014-08-03 DIAGNOSIS — Z8679 Personal history of other diseases of the circulatory system: Secondary | ICD-10-CM | POA: Diagnosis not present

## 2014-08-03 MED ORDER — IBUPROFEN 800 MG PO TABS: 800.0000 mg | ORAL_TABLET | Freq: Three times a day (TID) | ORAL | Status: DC | PRN

## 2014-08-03 MED ORDER — HYDROCODONE-ACETAMINOPHEN 5-325 MG PO TABS: 1.0000 | ORAL_TABLET | Freq: Four times a day (QID) | ORAL | Status: DC | PRN

## 2014-08-03 NOTE — ED Provider Notes (Signed)
CSN: [id number]     Arrival date & time 08/03/14  1020 History  This chart was scribed for non-physician practitioner, Dalia Heading, PA-C, working with Merryl Hacker, MD by Ladene Artist, ED Scribe. This patient was seen in room TR07C/TR07C and the patient's care was started at 11:07 AM.   Chief Complaint  Patient presents with  . Elbow Pain   The history is provided by the patient. No language interpreter was used.   HPI Comments: Joshua White is a 63 y.o. male who presents to the Emergency Department complaining of constant R elbow pain onset 2 days ago. 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 He reports associated swelling to his elbow. Pain is exacerbated with movement and with holding objects. He denies hand pain.   Past Medical History  Diagnosis Date  . G6PD deficiency   . Seizures   . Pneumonia   . RBBB (right bundle branch block)   . H/O orthostatic hypotension    Past Surgical History  Procedure Laterality Date  . Appendectomy    . US echocardiography  05/12/2008    EF 55-60%   Family History  Problem Relation Age of Onset  . Lung cancer Mother   . Brain cancer Mother   . Heart disease Father   . Hypertension Father   . Heart attack Father    History  Substance Use Topics  . Smoking status: Current Every Day Smoker    Types: Cigars  . Smokeless tobacco: Not on file  . Alcohol Use: Yes    Review of Systems  Musculoskeletal: Positive for arthralgias and joint swelling.  All other systems reviewed and are negative.  Allergies  Sulfa antibiotics  Home Medications   Prior to Admission medications   Medication Sig Start Date End Date Taking? Authorizing Provider  cephALEXin (KEFLEX) 500 MG capsule Take 1 capsule (500 mg total) by mouth 4 (four) times daily. 05/29/14   Nicole Pisciotta, PA-C  naproxen (NAPROSYN) 500 MG tablet Take 1 tablet (500 mg total) by mouth 2 (two) times daily with a meal. 05/29/14   Nicole Pisciotta, PA-C   OVER THE COUNTER MEDICATION Take 2 capsules by mouth 2 (two) times daily as needed (for pain. OTC Gel capsules).    Historical Provider, MD   Triage Vitals: BP 134/84  Pulse 78  Temp(Src) 97.4 F (36.3 C) (Oral)  Resp 18  Ht 5\' 9"  (1.753 m)  Wt 155 lb (70.308 kg)  BMI 22.88 kg/m2  SpO2 97% Physical Exam  Nursing note and vitals reviewed. Constitutional: He is oriented to person, place, and time. He appears well-developed and well-nourished.  HENT:  Head: Normocephalic and atraumatic.  Pulmonary/Chest: Effort normal.  Musculoskeletal: Normal range of motion.       Right elbow: He exhibits swelling. He exhibits normal range of motion.  Swelling at radial head Point tenderness Full ROM of wrist and elbow Good strength   Neurological: He is alert and oriented to person, place, and time.  Skin: Skin is warm and dry.  Psychiatric: He has a normal mood and affect. His behavior is normal.   ED Course  Procedures (including critical care time) DIAGNOSTIC STUDIES: Oxygen Saturation is 97% on RA, normal by my interpretation.    COORDINATION OF CARE: 11:09 AM-Discussed treatment plan which includes XR and follow-up with ortho with pt at bedside and pt agreed to plan.    Imaging Review Dg Elbow Complete Right  08/03/2014   CLINICAL  DATA:  Swelling about the lateral aspect of the right elbow. The patient felt a pop in the right elbow 3 days ago. Pain with motion. Initial encounter.  EXAM: RIGHT ELBOW - COMPLETE 3+ VIEW  COMPARISON:  None.  FINDINGS: Imaged bones, joints and soft tissues appear normal.  IMPRESSION: Normal examination.   Electronically Signed   By: Inge Rise M.D.   On: 08/03/2014 10:59   Return here as needed. Follow up with the orthopedist provided  I personally performed the services described in this documentation, which was scribed in my presence. The recorded information has been reviewed and is accurate.    Brent General, PA-C 08/03/14 1120

## 2014-08-03 NOTE — ED Provider Notes (Signed)
Medical screening examination/treatment/procedure(s) were performed by non-physician practitioner and as supervising physician I was immediately available for consultation/collaboration.   EKG Interpretation None        Merryl Hacker, MD 08/03/14 [phone]

## 2014-08-03 NOTE — ED Notes (Signed)
Patient states "I think I dislocated my R elbow.   I have a knot on it."   Patient has full ROM, but claims he can't pick up things the same now.   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

## 2014-08-03 NOTE — Discharge Instructions (Signed)
Return here as needed. Follow up with the orthopedist provided. Ice and heat to the elbow.

## 2015-07-21 ENCOUNTER — Emergency Department (HOSPITAL_COMMUNITY)
Admission: EM | Admit: 2015-07-21 | Discharge: 2015-07-21 | Discharge status: Against Medical Advice | Payer: Medicare Other | Attending: Emergency Medicine | Admitting: Emergency Medicine

## 2015-07-21 DIAGNOSIS — Y999 Unspecified external cause status: Secondary | ICD-10-CM | POA: Diagnosis not present

## 2015-07-21 DIAGNOSIS — Y9289 Other specified places as the place of occurrence of the external cause: Secondary | ICD-10-CM | POA: Insufficient documentation

## 2015-07-21 DIAGNOSIS — S0081XA Abrasion of other part of head, initial encounter: Secondary | ICD-10-CM | POA: Insufficient documentation

## 2015-07-21 DIAGNOSIS — W2209XA Striking against other stationary object, initial encounter: Secondary | ICD-10-CM | POA: Insufficient documentation

## 2015-07-21 DIAGNOSIS — Y9389 Activity, other specified: Secondary | ICD-10-CM | POA: Diagnosis not present

## 2015-07-21 DIAGNOSIS — S0990XA Unspecified injury of head, initial encounter: Secondary | ICD-10-CM | POA: Diagnosis present

## 2015-07-21 NOTE — ED Notes (Signed)
Patient approached Nurse First and stated he was leaving. Pt. Advised to come back if his symptoms persisted or got worse.

## 2015-07-21 NOTE — ED Notes (Signed)
Pt reports hitting his head under a house 5 days ago. Pt reports an abrasion to top of head. Noted to have a raised area with scab present. Pt states that he has had headaches since. Pt alert in triage. NAD noted. Pt not on blood thinners.

## 2016-09-04 ENCOUNTER — Telehealth: Payer: Self-pay | Admitting: *Deleted

## 2016-09-04 NOTE — Telephone Encounter (Signed)
Called patient and left voice mail stating referral from PCP. If he calls back please add him to Dr. Storm Frisk schedule for either 09/06/16 or 09/07/16. New patient appt for Hep C. Myrtis Hopping

## 2016-09-18 ENCOUNTER — Other Ambulatory Visit: Payer: Self-pay | Admitting: Internal Medicine

## 2016-09-18 ENCOUNTER — Encounter: Payer: Self-pay | Admitting: Internal Medicine

## 2016-09-18 ENCOUNTER — Ambulatory Visit (INDEPENDENT_AMBULATORY_CARE_PROVIDER_SITE_OTHER): Payer: Medicare Other | Admitting: Internal Medicine

## 2016-09-18 VITALS — BP 118/78 | HR 99 | Temp 98.9°F | Wt 153.0 lb

## 2016-09-18 DIAGNOSIS — A6001 Herpesviral infection of penis: Secondary | ICD-10-CM

## 2016-09-18 DIAGNOSIS — F1021 Alcohol dependence, in remission: Secondary | ICD-10-CM | POA: Insufficient documentation

## 2016-09-18 DIAGNOSIS — M19012 Primary osteoarthritis, left shoulder: Secondary | ICD-10-CM | POA: Diagnosis not present

## 2016-09-18 DIAGNOSIS — K769 Liver disease, unspecified: Secondary | ICD-10-CM

## 2016-09-18 DIAGNOSIS — D550 Anemia due to glucose-6-phosphate dehydrogenase [G6PD] deficiency: Secondary | ICD-10-CM

## 2016-09-18 DIAGNOSIS — R55 Syncope and collapse: Secondary | ICD-10-CM | POA: Diagnosis not present

## 2016-09-18 DIAGNOSIS — D75A Glucose-6-phosphate dehydrogenase (G6PD) deficiency without anemia: Secondary | ICD-10-CM

## 2016-09-18 DIAGNOSIS — K635 Polyp of colon: Secondary | ICD-10-CM

## 2016-09-18 DIAGNOSIS — B182 Chronic viral hepatitis C: Secondary | ICD-10-CM | POA: Diagnosis not present

## 2016-09-18 DIAGNOSIS — Z7251 High risk heterosexual behavior: Secondary | ICD-10-CM

## 2016-09-18 DIAGNOSIS — K219 Gastro-esophageal reflux disease without esophagitis: Secondary | ICD-10-CM | POA: Diagnosis not present

## 2016-09-18 NOTE — Progress Notes (Signed)
[facility]   CC: consideration for treatment for chronic hepatitis C  HPI:  +Joshua White is a 65 y.o. male who presents for initial evaluation and management of chronic hepatitis C.  Patient tested positive this year during routine screening. 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 Patient denies history of blood transfusion, history of clotting factor transfusion, intranasal drug use, renal dialysis, sexual contact with person with liver disease. Patient has had other studies performed. Results: hepatitis C RNA by PCR, result: positive. Patient has not had prior treatment for Hepatitis C. Patient does not have a past history of liver disease. Patient does not have a family history of liver disease. Patient does not  have associated signs or symptoms related to liver disease.  Labs reviewed and confirm chronic hepatitis C with a positive viral load.   Records reviewed from PCP in [place].  Does have positive HCV qualitative.  HIV negative in 2011.       Patient does not have documented immunity to Hepatitis A. Patient does not have documented immunity to Hepatitis B.    Review of Systems:   Constitutional: negative for fatigue and malaise Cardiovascular: negative for chest pressure/discomfort Gastrointestinal: negative for diarrhea Musculoskeletal: negative for myalgias and arthralgias All other systems reviewed and are negative       Past Medical History:  Diagnosis Date  . G6PD deficiency ([place])   . H/O orthostatic hypotension   . Pneumonia   . RBBB (right bundle branch block)   . Seizures ([place])     Prior to Admission medications   Medication Sig Start Date End Date Taking? Authorizing Provider  cephALEXin (KEFLEX) 500 MG capsule Take 1 capsule (500 mg total) by mouth 4 (four) times daily. 05/29/14  Yes Nicole Pisciotta, PA-C  HYDROcodone-acetaminophen (NORCO/VICODIN) 5-325 MG per tablet  Take 1 tablet by mouth every 6 (six) hours as needed for moderate pain. 08/03/14  Yes Christopher Lawyer, PA-C  ibuprofen (ADVIL,MOTRIN) 800 MG tablet Take 1 tablet (800 mg total) by mouth every 8 (eight) hours as needed. 08/03/14  Yes Christopher Lawyer, PA-C  naproxen (NAPROSYN) 500 MG tablet Take 1 tablet (500 mg total) by mouth 2 (two) times daily with a meal. 05/29/14  Yes Nicole Pisciotta, PA-C  OVER THE COUNTER MEDICATION Take 2 capsules by mouth 2 (two) times daily as needed (for pain. OTC Gel capsules).   Yes Historical Provider, MD    Allergies  Allergen Reactions  . Sulfa Antibiotics     Social History  Substance Use Topics  . Smoking status: Current Every Day Smoker    Types: Cigars  . Smokeless tobacco: Never Used  . Alcohol use Yes    Family History  Problem Relation Age of Onset  . Lung cancer Mother   . Brain cancer Mother   . Heart disease Father   . Hypertension Father   . Heart attack Father      Objective:  Constitutional: in no apparent distress and alert,  Vitals:   09/18/16 0932  BP: 118/78  Pulse: 99  Temp: 98.9 F (37.2 C)   Eyes: anicteric Cardiovascular: Cor RRR Respiratory: CTA B; normal respiratory effort Gastrointestinal: Bowel sounds are normal, liver is not enlarged, spleen is not enlarged Musculoskeletal: no pedal edema noted Skin: negatives: no rash; no porphyria cutanea tarda Lymphatic: no cervical lymphadenopathy   Laboratory Genotype: No results found for: HCVGENOTYPE HCV viral load: No results found for:  HCVQUANT Lab Results  Component Value Date   WBC 5.6 02/23/2011   HGB 13.1 02/23/2011   HCT 38.2 (L) 02/23/2011   MCV 100.8 (H) 02/23/2011   PLT 143 (L) 02/23/2011    Lab Results  Component Value Date   CREATININE 0.91 02/23/2011   BUN 11 02/23/2011   NA 136 02/23/2011   K 4.2 02/23/2011   CL 104 02/23/2011   CO2 27 02/23/2011    Lab Results  Component Value Date   ALT 25 02/23/2011   AST 28 02/23/2011   ALKPHOS  71 02/23/2011     Labs and history reviewed and show CHILD-PUGH A  5-6 points: Child class A 7-9 points: Child class B 10-15 points: Child class C  Lab Results  Component Value Date   INR 1.03 02/23/2011   BILITOT 0.9 02/23/2011   ALBUMIN 3.8 02/23/2011     Assessment: New Patient with Chronic Hepatitis C genotype unknown, untreated.  I discussed with the patient the lab findings that confirm chronic hepatitis C as well as the natural history and progression of disease including about 30% of people who develop cirrhosis of the liver if left untreated and once cirrhosis is established there is a 2-7% risk per year of liver cancer and liver failure.  I discussed the importance of treatment and benefits in reducing the risk, even if significant liver fibrosis exists.   Plan: 1) Patient counseled extensively on limiting acetaminophen to no more than 2 grams daily, avoidance of alcohol. 2) Transmission discussed with patient including sexual transmission, sharing razors and toothbrush.   3) Will need referral to gastroenterology if concern for cirrhosis 4) Will need referral for substance abuse counseling: No.; Further work up to include urine drug screen  No. 5) Will prescribe Harvoni for 12 weeks or equivalent, depending on genotype 6) Hepatitis A, B titers 8) Pneumovax vaccine if concern for cirrhosis 9) Further work up to include liver staging with elastography 10) will follow up after starting medication 11) GERD - he states he only takes occasional tums but nothing daily.  Omeprazole on his list but reports he does not take it.

## 2016-09-18 NOTE — Patient Instructions (Signed)
Date 09/18/16  Dear Mr Hickam, As discussed in the [facility], your hepatitis C therapy will include the following medications:          Harvoni '90mg'$ /'400mg'$  tablet or equivalent:           Take 1 tablet by mouth once daily   Please note that ALL MEDICATIONS WILL START ON THE SAME DATE for a total of 12 weeks. ---------------------------------------------------------------- Your HCV Treatment Start Date: TBA   Your HCV genotype: unknown    Liver Fibrosis: TBD    ---------------------------------------------------------------- YOUR PHARMACY CONTACT:   [place] [facility] [place] Phone: [phone] Hours: Monday to Friday 7:30 am to 6:00 pm   Please always contact your pharmacy at least 3-4 business days before you run out of medications to ensure your next month's medication is ready or 1 week prior to running out if you receive it by mail.  Remember, each prescription is for 28 days. ---------------------------------------------------------------- GENERAL NOTES REGARDING YOUR HEPATITIS C MEDICATION:  SOFOSBUVIR/LEDIPASVIR (HARVONI): - Harvoni tablet is taken daily with OR without food. - The tablets are orange. - The tablets should be stored at room temperature.  - Acid reducing agents such as H2 blockers (ie. Pepcid (famotidine), Zantac (ranitidine), Tagamet (cimetidine), Axid (nizatidine) and proton pump inhibitors (ie. Prilosec (omeprazole), Protonix (pantoprazole), Nexium (esomeprazole), or Aciphex (rabeprazole)) can decrease effectiveness of Harvoni. Do not take until you have discussed with a health care provider.    -Antacids that contain magnesium and/or aluminum hydroxide (ie. Milk of Magensia, Rolaids, Gaviscon, Maalox, Mylanta, an dArthritis Pain Formula)can reduce absorption of Harvoni, so take them at least 4 hours before or after Harvoni.  -Calcium carbonate (calcium supplements or antacids such as Tums,  Caltrate, Os-Cal)needs to be taken at least 4 hours hours before or after Harvoni.  -St. John's wort or any products that contain St. John's wort like some herbal supplements  Please inform the office prior to starting any of these medications.  - The common side effects associated with Harvoni include:      1. Fatigue      2. Headache      3. Nausea      4. Diarrhea      5. Insomnia  Please note that this only lists the most common side effects and is NOT a comprehensive list of the potential side effects of these medications. For more information, please review the drug information sheets that come with your medication package from the pharmacy.  ---------------------------------------------------------------- GENERAL HELPFUL HINTS ON HCV THERAPY: 1. Stay well-hydrated. 2. Notify the [facility] Clinic of any changes in your other over-the-counter/herbal or prescription medications. 3. If you miss a dose of your medication, take the missed dose as soon as you remember. Return to your regular time/dose schedule the next day.  4.  Do not stop taking your medications without first talking with your healthcare provider. 5.  You may take Tylenol (acetaminophen), as long as the dose is less than 2000 mg (OR no more than 4 tablets of the Tylenol Extra Strengths '500mg'$  tablet) in 24 hours. 6.  You will see our pharmacist-specialist within the first 2 weeks of starting your medication to monitor for any possible side effects. 7.  You will have labs once during treatment, after soon after treatment completion and one final lab 6 months after treatment completion to verify the virus is out of your system.  Scharlene Gloss, [facility] for  Infectious [facility] [address] [place], [employer]  [id number] [phone]

## 2016-09-19 LAB — COMPLETE METABOLIC PANEL WITH GFR
ALT: 18 U/L (ref 9–46)
AST: 30 U/L (ref 10–35)
Albumin: 3.8 g/dL (ref 3.6–5.1)
Alkaline Phosphatase: 81 U/L (ref 40–115)
BUN: 19 mg/dL (ref 7–25)
CHLORIDE: 103 mmol/L (ref 98–110)
CO2: 28 mmol/L (ref 20–31)
CREATININE: 0.77 mg/dL (ref 0.70–1.25)
Calcium: 9.2 mg/dL (ref 8.6–10.3)
GFR, Est African American: >89 mL/min (ref >=60)
GFR, Est Non African American: >89 mL/min (ref >=60)
GLUCOSE: 91 mg/dL (ref 65–99)
Potassium: 4.1 mmol/L (ref 3.5–5.3)
Sodium: 137 mmol/L (ref 135–146)
Total Bilirubin: 0.9 mg/dL (ref 0.2–1.2)
Total Protein: 7.8 g/dL (ref 6.1–8.1)

## 2016-09-19 LAB — HEPATITIS B SURFACE ANTIGEN: HEP B S AG: NEGATIVE

## 2016-09-19 LAB — CBC WITH DIFFERENTIAL/PLATELET
BASOS PCT: 0 %
Basophils Absolute: 0 cells/uL (ref 0–200)
EOS PCT: 2 %
Eosinophils Absolute: 134 cells/uL (ref 15–500)
HCT: 41 % (ref 38.5–50.0)
Hemoglobin: 13 g/dL — ABNORMAL LOW (ref 13.2–17.1)
LYMPHS PCT: 23 %
Lymphs Abs: 1541 cells/uL (ref 850–3900)
MCH: 32.8 pg (ref 27.0–33.0)
MCHC: 31.7 g/dL — ABNORMAL LOW (ref 32.0–36.0)
MCV: 103.5 fL — ABNORMAL HIGH (ref 80.0–100.0)
MONO ABS: 603 {cells}/uL (ref 200–950)
MPV: 12.7 fL — AB (ref 7.5–12.5)
Monocytes Relative: 9 %
Neutro Abs: 4422 cells/uL (ref 1500–7800)
Neutrophils Relative %: 66 %
Platelets: 263 10*3/uL (ref 140–400)
RBC: 3.96 MIL/uL — AB (ref 4.20–5.80)
RDW: 12.6 % (ref 11.0–15.0)
WBC: 6.7 10*3/uL (ref 3.8–10.8)

## 2016-09-19 LAB — HEPATITIS B SURFACE ANTIBODY,QUALITATIVE: HEP B S AB: NEGATIVE

## 2016-09-19 LAB — HEPATITIS B CORE ANTIBODY, TOTAL: Hep B Core Total Ab: NONREACTIVE

## 2016-09-19 LAB — HEPATITIS A ANTIBODY, TOTAL: Hep A Total Ab: NONREACTIVE

## 2016-09-22 LAB — LIVER FIBROSIS, FIBROTEST-ACTITEST
ALT: 18 U/L (ref 9–46)
APOLIPOPROTEIN A1: 110 mg/dL (ref 94–176)
Alpha-2-Macroglobulin: 318 mg/dL — ABNORMAL HIGH (ref 106–279)
Bilirubin: 0.8 mg/dL (ref 0.2–1.2)
FIBROSIS SCORE: 0.69
GGT: 89 U/L — ABNORMAL HIGH (ref 3–70)
HAPTOGLOBIN: 339 mg/dL — AB (ref 43–212)
Necroinflammat ACT Score: 0.12
REFERENCE ID: 1707010

## 2016-09-22 LAB — HCV RNA QUANT RFLX ULTRA OR GENOTYP
HCV QUANT LOG: 6.46 {Log} — AB (ref <1.18)
HCV Quantitative: 2874503 IU/mL — ABNORMAL HIGH (ref <15)

## 2016-09-25 ENCOUNTER — Other Ambulatory Visit: Payer: Self-pay | Admitting: Internal Medicine

## 2016-09-25 LAB — HEPATITIS C GENOTYPE

## 2016-09-25 MED ORDER — LEDIPASVIR-SOFOSBUVIR 90-400 MG PO TABS: 1.0000 | ORAL_TABLET | Freq: Every day | ORAL | 2 refills | Status: DC

## 2016-09-26 ENCOUNTER — Other Ambulatory Visit: Payer: Self-pay | Admitting: Pharmacist Clinician (PhC)/ Clinical Pharmacy Specialist

## 2016-09-26 MED ORDER — ELBASVIR-GRAZOPREVIR 50-100 MG PO TABS: 1.0000 | ORAL_TABLET | Freq: Every day | ORAL | 2 refills | Status: DC

## 2016-09-27 ENCOUNTER — Encounter: Payer: Self-pay | Admitting: Pharmacy Technician

## 2016-09-27 MED FILL — ZEPATIER 50-100 MG TABLET: 50-100 | 28 days supply | Qty: 28 | Fill #0

## 2016-10-17 ENCOUNTER — Ambulatory Visit (INDEPENDENT_AMBULATORY_CARE_PROVIDER_SITE_OTHER): Payer: Medicare Other | Admitting: Pharmacist Clinician (PhC)/ Clinical Pharmacy Specialist

## 2016-10-17 DIAGNOSIS — B182 Chronic viral hepatitis C: Secondary | ICD-10-CM

## 2016-10-17 DIAGNOSIS — Z23 Encounter for immunization: Secondary | ICD-10-CM

## 2016-10-17 NOTE — Patient Instructions (Signed)
Continue zepatier 1 tablet for 3 months Come back for labs in 1 week and then at the end of treatment and at the end of May I will call you in the spring to come back for the cure visit

## 2016-10-17 NOTE — Progress Notes (Signed)
HPI: Joshua White is a 65 y.o. male who is here for his hep C pharmacy visit.   Lab Results  Component Value Date   HCVGENOTYPE 1b 09/18/2016    Allergies: Allergies  Allergen Reactions  . Sulfa Antibiotics     Vitals:    Past Medical History: Past Medical History:  Diagnosis Date  . G6PD deficiency ([place])   . H/O orthostatic hypotension   . Pneumonia   . RBBB (right bundle branch block)   . Seizures ([place])     Social History: Social History   Social History  . Marital status: Single    Spouse name: N/A  . Number of children: N/A  . Years of education: N/A   Social History Main Topics  . Smoking status: Current Every Day Smoker    Types: Cigars  . Smokeless tobacco: Never Used  . Alcohol use Yes  . Drug use: No  . Sexual activity: Not on file   Other Topics Concern  . Not on file   Social History Narrative  . No narrative on file    Labs: Hep B S Ab (no units)  Date Value  09/18/2016 NEG   Hepatitis B Surface Ag (no units)  Date Value  09/18/2016 NEGATIVE    Lab Results  Component Value Date   HCVGENOTYPE 1b 09/18/2016    Hepatitis C RNA quantitative Latest Ref Rng & Units 09/18/2016  HCV Quantitative <15 IU/mL 1,031,281(V)  HCV Quantitative Log <1.18 log 10 6.46(H)    AST  Date Value  09/18/2016 30 U/L  02/23/2011 28 U/L  03/17/2008 27   ALT  Date Value  09/18/2016 18 U/L  09/18/2016 18 U/L  02/23/2011 25 U/L  03/17/2008 24   INR (no units)  Date Value  02/23/2011 1.03    CrCl: CrCl cannot be calculated (Patient's most recent lab result is older than the maximum 21 days allowed.).  Fibrosis Score: F3 as assessed by fibrosure  Child-Pugh Score: Class A  Previous Treatment Regimen: Naive  Assessment: [name] started on his zepatier on 11/30 for his 1b hep C. He has not missed any doses so far. He did complain of some tiredness and fatigue since starting the meds. Explained to him that could be due to the drug itself.  Encourage him to push through for  3 months. Scheduled him for the 4, EOT, and SVR12 VL. Will call him back in the spring to schedule cure visit. He is hep A and B ab neg so will start the first dose today.   Recommendations: Cont zepatier x 3 mo Hep A and B vaccine Hep C VL in 2 wks, EOT, and SVR12 F/u with pharmacy for the cure visit  Onnie Boer [place], [place].D., BCPS, AAHIVP Clinical Infectious [facility] for Infectious Disease 10/17/2016, 9:53 AM

## 2016-10-17 NOTE — Addendum Note (Signed)
Addended by: Dinah Beers on: 10/17/2016 10:55 AM   Modules accepted: Orders

## 2016-10-19 MED FILL — ZEPATIER 50-100 MG TABLET: 50-100 | 28 days supply | Qty: 28 | Fill #1

## 2016-10-25 ENCOUNTER — Other Ambulatory Visit: Payer: Medicare Other

## 2016-10-25 DIAGNOSIS — B182 Chronic viral hepatitis C: Secondary | ICD-10-CM

## 2016-10-30 LAB — HEPATITIS C RNA QUANTITATIVE: HCV Quantitative: NOT DETECTED IU/mL (ref <15)

## 2016-11-13 MED FILL — ZEPATIER 50-100 MG TABLET: 50-100 | 28 days supply | Qty: 28 | Fill #2

## 2016-11-16 ENCOUNTER — Ambulatory Visit: Payer: Medicare Other

## 2016-12-11 ENCOUNTER — Encounter (HOSPITAL_COMMUNITY): Payer: Self-pay | Admitting: Emergency Medicine

## 2016-12-11 ENCOUNTER — Emergency Department (HOSPITAL_COMMUNITY): Payer: Medicare Other

## 2016-12-11 ENCOUNTER — Emergency Department (HOSPITAL_COMMUNITY)
Admission: EM | Admit: 2016-12-11 | Discharge: 2016-12-11 | Disposition: A | Discharge status: Home / Self Care | Payer: Medicare Other | Attending: Emergency Medicine | Admitting: Emergency Medicine

## 2016-12-11 DIAGNOSIS — J90 Pleural effusion, not elsewhere classified: Secondary | ICD-10-CM | POA: Diagnosis not present

## 2016-12-11 DIAGNOSIS — R042 Hemoptysis: Secondary | ICD-10-CM

## 2016-12-11 DIAGNOSIS — Z79899 Other long term (current) drug therapy: Secondary | ICD-10-CM | POA: Insufficient documentation

## 2016-12-11 DIAGNOSIS — F17290 Nicotine dependence, other tobacco product, uncomplicated: Secondary | ICD-10-CM | POA: Insufficient documentation

## 2016-12-11 DIAGNOSIS — Z9889 Other specified postprocedural states: Secondary | ICD-10-CM

## 2016-12-11 LAB — COMPREHENSIVE METABOLIC PANEL
ALK PHOS: 64 U/L (ref 38–126)
ALT: 10 U/L — ABNORMAL LOW (ref 17–63)
ANION GAP: 11 (ref 5–15)
AST: 28 U/L (ref 15–41)
Albumin: 3.1 g/dL — ABNORMAL LOW (ref 3.5–5.0)
BUN: 11 mg/dL (ref 6–20)
CO2: 23 mmol/L (ref 22–32)
Calcium: 9.6 mg/dL (ref 8.9–10.3)
Chloride: 101 mmol/L (ref 101–111)
Creatinine, Ser: 0.72 mg/dL (ref 0.61–1.24)
GFR calc Af Amer: >60 mL/min (ref >60)
GFR calc non Af Amer: >60 mL/min (ref >60)
GLUCOSE: 90 mg/dL (ref 65–99)
Potassium: 4.4 mmol/L (ref 3.5–5.1)
SODIUM: 135 mmol/L (ref 135–145)
Total Bilirubin: 0.8 mg/dL (ref 0.3–1.2)
Total Protein: 8.5 g/dL — ABNORMAL HIGH (ref 6.5–8.1)

## 2016-12-11 LAB — CBC WITH DIFFERENTIAL/PLATELET
Basophils Absolute: 0 10*3/uL (ref 0.0–0.1)
Basophils Relative: 0 %
Eosinophils Absolute: 0.1 10*3/uL (ref 0.0–0.7)
Eosinophils Relative: 1 %
HCT: 34.9 % — ABNORMAL LOW (ref 39.0–52.0)
Hemoglobin: 11.7 g/dL — ABNORMAL LOW (ref 13.0–17.0)
LYMPHS PCT: 22 %
Lymphs Abs: 1.6 10*3/uL (ref 0.7–4.0)
MCH: 33 pg (ref 26.0–34.0)
MCHC: 33.5 g/dL (ref 30.0–36.0)
MCV: 98.3 fL (ref 78.0–100.0)
Monocytes Absolute: 0.7 10*3/uL (ref 0.1–1.0)
Monocytes Relative: 11 %
NEUTROS PCT: 66 %
Neutro Abs: 4.7 10*3/uL (ref 1.7–7.7)
Platelets: 349 10*3/uL (ref 150–400)
RBC: 3.55 MIL/uL — ABNORMAL LOW (ref 4.22–5.81)
RDW: 13 % (ref 11.5–15.5)
WBC: 7.1 10*3/uL (ref 4.0–10.5)

## 2016-12-11 LAB — PROTIME-INR
INR: 1.17
Prothrombin Time: 14.9 seconds (ref 11.4–15.2)

## 2016-12-11 LAB — GRAM STAIN

## 2016-12-11 LAB — GLUCOSE, SEROUS FLUID: Glucose, Fluid: 67 mg/dL

## 2016-12-11 LAB — BODY FLUID CELL COUNT WITH DIFFERENTIAL
Eos, Fluid: 1 %
Lymphs, Fluid: 96 %
MONOCYTE-MACROPHAGE-SEROUS FLUID: 2 % — AB (ref 50–90)
Neutrophil Count, Fluid: 1 % (ref 0–25)
WBC FLUID: 1007 uL — AB (ref 0–1000)

## 2016-12-11 LAB — LACTATE DEHYDROGENASE, PLEURAL OR PERITONEAL FLUID: LD, Fluid: 356 U/L — ABNORMAL HIGH (ref 3–23)

## 2016-12-11 LAB — PROTEIN, BODY FLUID: Total protein, fluid: 5.7 g/dL

## 2016-12-11 MED ORDER — LIDOCAINE HCL 1 % IJ SOLN
INTRAMUSCULAR | Status: AC
  Filled 2016-12-11: qty 20

## 2016-12-11 NOTE — ED Provider Notes (Addendum)
[facility] DEPT Provider Note   CSN: [id number] Arrival date & time: 12/11/16  8338     History   Chief Complaint Chief Complaint  Patient presents with  . Hemoptysis    HPI Joshua White is a 66 y.o. male.  HPI Patient presents with hemoptysis. States he coughed up some blood today. States was pretty much just blood. Has had some shortness of breath that is not nicely worse today. States he's been short of breath for a while now. He is on treatment for his hepatitis C states that since that started he's been feeling worse all over. States he has lost weight but cannot quantify it. Seen by Dr. Linus Salmons. No fevers or chills. No swelling in his legs. Has no nausea vomiting. No other bleeding. No black stools. He smokes cigars. No abdominal pain. Past Medical History:  Diagnosis Date  . G6PD deficiency ([place])   . H/O orthostatic hypotension   . Pneumonia   . RBBB (right bundle branch block)   . Seizures [facility])     Patient Active Problem List   Diagnosis Date Noted  . Chronic hepatitis C without hepatic coma ([place]) 09/18/2016  . Sessile colonic polyp 09/18/2016  . 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 G6PD deficiency ([place]) 09/18/2016  . Esophageal reflux 09/18/2016  . Syncope 09/18/2016  . Chronic alcoholism in remission ([place]) 09/18/2016  . Osteoarthritis of left shoulder 09/18/2016    Past Surgical History:  Procedure Laterality Date  . APPENDECTOMY    . US ECHOCARDIOGRAPHY  05/12/2008   EF 55-60%       Home Medications    Prior to Admission medications   Medication Sig Start Date End Date Taking? Authorizing Provider  Elbasvir-Grazoprevir (ZEPATIER) 50-100 MG TABS Take 1 tablet by mouth daily. 09/26/16  Yes Thayer Headings, MD  ibuprofen (ADVIL,MOTRIN) 200 MG tablet Take 200 mg by mouth every 4 (four) hours as needed for mild pain.   Yes Historical Provider, MD  HYDROcodone-acetaminophen (NORCO/VICODIN) 5-325 MG per tablet Take 1 tablet by mouth every 6  (six) hours as needed for moderate pain. Patient not taking: Reported on 10/17/2016 08/03/14   Dalia Heading, PA-C  ibuprofen (ADVIL,MOTRIN) 800 MG tablet Take 1 tablet (800 mg total) by mouth every 8 (eight) hours as needed. Patient not taking: Reported on 12/11/2016 08/03/14   Dalia Heading, PA-C  naproxen (NAPROSYN) 500 MG tablet Take 1 tablet (500 mg total) by mouth 2 (two) times daily with a meal. Patient not taking: Reported on 10/17/2016 05/29/14   Monico Blitz, PA-C    Family History Family History  Problem Relation Age of Onset  . Lung cancer Mother   . Brain cancer Mother   . Heart disease Father   . Hypertension Father   . Heart attack Father     Social History Social History  Substance Use Topics  . Smoking status: Current Every Day Smoker    Types: Cigars  . Smokeless tobacco: Never Used  . Alcohol use Yes     Allergies   Tuberculin purified protein derivative and Sulfa antibiotics   Review of Systems Review of Systems  Constitutional: Positive for fatigue and unexpected weight change. Negative for appetite change.  HENT: Negative for congestion.   Respiratory: Positive for shortness of breath.   Cardiovascular: Negative for chest pain.  Gastrointestinal: Negative for abdominal pain.  Genitourinary: Negative for frequency.  Musculoskeletal: Negative for back pain.  Skin: Negative for wound.  Neurological: Positive for weakness.  Hematological:  Does not bruise/bleed easily.  Psychiatric/Behavioral: Negative for confusion.     Physical Exam Updated Vital Signs BP 140/84 (BP Location: Left Arm)   Pulse 103   Temp 98.4 F (36.9 C) (Oral)   Resp (!) 27   SpO2 95%   Physical Exam  Constitutional: He appears well-developed.  HENT:  Head: Atraumatic.  Eyes: Pupils are equal, round, and reactive to light.  Neck: Neck supple.  Cardiovascular: Normal rate.   Pulmonary/Chest: Effort normal.  Decreased breath sounds through much of left lung  field.  Abdominal: Soft. There is no tenderness.  Musculoskeletal: He exhibits no edema.  Neurological: He is alert.  Skin: Skin is warm. Capillary refill takes less than 2 seconds.  Psychiatric: He has a normal mood and affect.     ED Treatments / Results  Labs (all labs ordered are listed, but only abnormal results are displayed) Labs Reviewed  CBC WITH DIFFERENTIAL/PLATELET - Abnormal; Notable for the following:       Result Value   RBC 3.55 (*)    Hemoglobin 11.7 (*)    HCT 34.9 (*)    All other components within normal limits  COMPREHENSIVE METABOLIC PANEL - Abnormal; Notable for the following:    Total Protein 8.5 (*)    Albumin 3.1 (*)    ALT 10 (*)    All other components within normal limits  CULTURE, BODY FLUID-BOTTLE  GRAM STAIN  PROTIME-INR  LACTATE DEHYDROGENASE, BODY FLUID  PROTEIN, BODY FLUID  TRIGLYCERIDES, BODY FLUIDS  BODY FLUID CELL COUNT WITH DIFFERENTIAL  GLUCOSE, SEROUS FLUID  MISC [facility] TEST (SEND OUT)  CYTOLOGY - NON PAP    EKG  EKG Interpretation None       Radiology Dg Chest 1 View  Result Date: 12/11/2016 CLINICAL DATA:  66 year old who underwent left thoracentesis earlier this afternoon with removal of 1.3 L of fluid. The thoracentesis was discontinued due to chest pain. EXAM: CHEST 1 VIEW 3:02 p.m.: COMPARISON:  Two-view chest x-ray earlier today 9:24 a.m. 02/23/2011. FINDINGS: Interval decrease in size of the still large left pleural effusion. No pneumothorax. Slight improved aeration in the left upper lobe, though dense passive atelectasis and/or pneumonia persists in the left lower lobe and lingula. Right lung remains clear. IMPRESSION: 1. No pneumothorax after left thoracentesis. 2. Interval decrease in size of the still large left pleural effusion since earlier today. 3. Slight improved aeration in the left upper lobe, though dense passive atelectasis and/or pneumonia persists in the left lower lobe and lingula. Electronically Signed    By: Evangeline Dakin M.D.   On: 12/11/2016 15:35   Dg Chest 2 View  Result Date: 12/11/2016 CLINICAL DATA:  Hemoptysis EXAM: CHEST  2 VIEW COMPARISON:  02/23/2011 chest radiograph. FINDINGS: Stable cardiomediastinal silhouette with normal heart size. No pneumothorax. No right pleural effusion. Large left pleural effusion with mild right mediastinal deviation. Near complete opacification of the left lung, with mild residual aeration in the apical left upper lobe. No consolidative airspace disease in the right lung. No pulmonary edema. IMPRESSION: Large left pleural effusion with near complete left lung opacification, with mild residual aeration in the apical left upper lobe. Left lung opacification could be due to atelectasis, mass and/or pneumonia. Follow-up post thoracentesis chest imaging advised. Electronically Signed   By: Ilona Sorrel M.D.   On: 12/11/2016 09:32   US Thoracentesis Asp Pleural Space W/img Guide  Result Date: 12/11/2016 INDICATION: Hemoptysis and shortness of breath for 10 weeks. New findings of left-sided pleural  effusion. Request is made for diagnostic and therapeutic thoracentesis. EXAM: ULTRASOUND GUIDED DIAGNOSTIC AND THERAPEUTIC THORACENTESIS MEDICATIONS: 1% lidocaine COMPLICATIONS: None immediate. PROCEDURE: An ultrasound guided thoracentesis was thoroughly discussed with the patient and questions answered. The benefits, risks, alternatives and complications were also discussed. The patient understands and wishes to proceed with the procedure. Written consent was obtained. Ultrasound was performed to localize and mark an adequate pocket of fluid in the left chest. The area was then prepped and draped in the normal sterile fashion. 1% Lidocaine was used for local anesthesia. Under ultrasound guidance a Saf-T-Centesis catheter was introduced. Thoracentesis was performed. The catheter was removed and a dressing applied. FINDINGS: A total of approximately 1.3 L of serosanguineous fluid  was removed. Samples were sent to the laboratory as requested by the clinical team. IMPRESSION: Successful ultrasound guided left thoracentesis yielding 1.3 L of pleural fluid. The procedure was terminated at this amount secondary to coughing and chest pain. The patient will be arranged to return as an outpatient for a follow up thoracentesis and CT of the chest. This was discussed with Dr. Alvino Chapel, ED MD. Read by: Saverio Danker, PA-C Electronically Signed   By: Sandi Mariscal M.D.   On: 12/11/2016 15:19    Procedures Procedures (including critical care time)  Medications Ordered in ED Medications  lidocaine (XYLOCAINE) 1 % (with pres) injection (not administered)     Initial Impression / Assessment and Plan / ED Course  I have reviewed the triage vital signs and the nursing notes.  Pertinent labs & imaging results that were available during my care of the patient were reviewed by me and considered in my medical decision making (see chart for details).   patient presents with hemoptysis. Hemoglobin is slightly decreased. Has been on treatment for his hepatitis C through infectious disease. X-ray shows large pleural effusion on left side. He has been losing weight which is worrisome for malignancy although this could just be from his hepatitis treatment. With hemoptysis and a fracture cannot get good view of the lungs that think it be worth observation to get a thoracentesis and further evaluation in the hospital.  Final Clinical Impressions(s) / ED Diagnoses   Final diagnoses:  Pleural effusion  Hemoptysis    New Prescriptions New Prescriptions   No medications on file     Davonna Belling, MD 12/11/16 1249  Discuss with hospitalist team. They request that workup be done through the ER. Discussed with interventional radiology who was able to perform a ultrasound-guided thoracentesis however they were only able to get about half of the fluid. Discussed with the PA from IR and  recommended outpatient follow-up order to get outpatient thoracentesis. At that time can also get CT of the chest 2 evaluate the lungs further. X-ray done after did not show pneumothorax but did show some density in the lobes. He has not had fevers and does not have an elevated white count. We'll not empirically treat as pneumonia will need to be followed. I talked with Dr. Billey Chang. was the patient's primary care doctor who will help follow-up with the findings.    Davonna Belling, MD 12/11/16 1544

## 2016-12-11 NOTE — Discharge Instructions (Addendum)
Interventional radiology should call you to schedule the follow-up thoracentesis. Ideally in the next couple days. At that time he can get the CT scan also. Rosezena Sensor can help you with the results.

## 2016-12-11 NOTE — Procedures (Signed)
Ultrasound-guided diagnostic and therapeutic left thoracentesis performed yielding 1.3 liters of serosanguineous colored fluid.  Procedure was terminated at this time secondary to chest pain and coughing. No immediate complications. Follow-up chest x-ray pending.      Jammi Morrissette E 3:05 PM 12/11/2016

## 2016-12-11 NOTE — ED Notes (Signed)
Pt transported to US

## 2016-12-11 NOTE — ED Triage Notes (Signed)
Pt here for coughing up blood tinged sputum; pt sts taking Hep C meds and is almost complete; pt denies fever

## 2016-12-12 LAB — TRIGLYCERIDES, BODY FLUIDS: Triglycerides, Fluid: 23 mg/dL

## 2016-12-12 LAB — MISC LABCORP TEST (SEND OUT): LABCORP TEST CODE: 88062

## 2016-12-16 LAB — CULTURE, BODY FLUID W GRAM STAIN -BOTTLE: Culture: NO GROWTH

## 2016-12-16 LAB — CULTURE, BODY FLUID-BOTTLE

## 2016-12-18 ENCOUNTER — Encounter (HOSPITAL_COMMUNITY): Payer: Self-pay | Admitting: Nurse Practitioner

## 2016-12-18 ENCOUNTER — Observation Stay (HOSPITAL_COMMUNITY): Payer: Medicare Other

## 2016-12-18 ENCOUNTER — Emergency Department (HOSPITAL_COMMUNITY): Payer: Medicare Other

## 2016-12-18 ENCOUNTER — Inpatient Hospital Stay (HOSPITAL_COMMUNITY)
Admission: EM | Admit: 2016-12-18 | Discharge: 2016-12-26 | DRG: 167 | Disposition: A | Discharge status: Home / Self Care | Payer: Medicare Other | Attending: Internal Medicine | Admitting: Internal Medicine

## 2016-12-18 DIAGNOSIS — R630 Anorexia: Secondary | ICD-10-CM | POA: Diagnosis not present

## 2016-12-18 DIAGNOSIS — Z801 Family history of malignant neoplasm of trachea, bronchus and lung: Secondary | ICD-10-CM

## 2016-12-18 DIAGNOSIS — C3492 Malignant neoplasm of unspecified part of left bronchus or lung: Secondary | ICD-10-CM | POA: Diagnosis present

## 2016-12-18 DIAGNOSIS — B182 Chronic viral hepatitis C: Secondary | ICD-10-CM | POA: Diagnosis present

## 2016-12-18 DIAGNOSIS — R0602 Shortness of breath: Secondary | ICD-10-CM | POA: Diagnosis present

## 2016-12-18 DIAGNOSIS — J90 Pleural effusion, not elsewhere classified: Secondary | ICD-10-CM | POA: Diagnosis not present

## 2016-12-18 DIAGNOSIS — J939 Pneumothorax, unspecified: Secondary | ICD-10-CM | POA: Diagnosis not present

## 2016-12-18 DIAGNOSIS — C3490 Malignant neoplasm of unspecified part of unspecified bronchus or lung: Secondary | ICD-10-CM

## 2016-12-18 DIAGNOSIS — E440 Moderate protein-calorie malnutrition: Secondary | ICD-10-CM | POA: Diagnosis present

## 2016-12-18 DIAGNOSIS — Z808 Family history of malignant neoplasm of other organs or systems: Secondary | ICD-10-CM | POA: Diagnosis not present

## 2016-12-18 DIAGNOSIS — I4510 Unspecified right bundle-branch block: Secondary | ICD-10-CM | POA: Diagnosis present

## 2016-12-18 DIAGNOSIS — Z7982 Long term (current) use of aspirin: Secondary | ICD-10-CM | POA: Diagnosis not present

## 2016-12-18 DIAGNOSIS — D649 Anemia, unspecified: Secondary | ICD-10-CM | POA: Diagnosis present

## 2016-12-18 DIAGNOSIS — Z87891 Personal history of nicotine dependence: Secondary | ICD-10-CM | POA: Diagnosis not present

## 2016-12-18 DIAGNOSIS — Z681 Body mass index (BMI) 19 or less, adult: Secondary | ICD-10-CM

## 2016-12-18 DIAGNOSIS — Z9689 Presence of other specified functional implants: Secondary | ICD-10-CM | POA: Diagnosis not present

## 2016-12-18 DIAGNOSIS — Z9889 Other specified postprocedural states: Secondary | ICD-10-CM | POA: Diagnosis not present

## 2016-12-18 DIAGNOSIS — J910 Malignant pleural effusion: Secondary | ICD-10-CM | POA: Diagnosis present

## 2016-12-18 DIAGNOSIS — Z77090 Contact with and (suspected) exposure to asbestos: Secondary | ICD-10-CM | POA: Diagnosis present

## 2016-12-18 DIAGNOSIS — K5900 Constipation, unspecified: Secondary | ICD-10-CM | POA: Diagnosis not present

## 2016-12-18 DIAGNOSIS — F1021 Alcohol dependence, in remission: Secondary | ICD-10-CM

## 2016-12-18 DIAGNOSIS — R918 Other nonspecific abnormal finding of lung field: Secondary | ICD-10-CM | POA: Diagnosis not present

## 2016-12-18 DIAGNOSIS — R634 Abnormal weight loss: Secondary | ICD-10-CM | POA: Diagnosis not present

## 2016-12-18 DIAGNOSIS — R569 Unspecified convulsions: Secondary | ICD-10-CM | POA: Diagnosis present

## 2016-12-18 DIAGNOSIS — Z79899 Other long term (current) drug therapy: Secondary | ICD-10-CM | POA: Diagnosis not present

## 2016-12-18 DIAGNOSIS — Z888 Allergy status to other drugs, medicaments and biological substances status: Secondary | ICD-10-CM | POA: Diagnosis not present

## 2016-12-18 DIAGNOSIS — Z8249 Family history of ischemic heart disease and other diseases of the circulatory system: Secondary | ICD-10-CM

## 2016-12-18 DIAGNOSIS — Z09 Encounter for follow-up examination after completed treatment for conditions other than malignant neoplasm: Secondary | ICD-10-CM

## 2016-12-18 DIAGNOSIS — B1920 Unspecified viral hepatitis C without hepatic coma: Secondary | ICD-10-CM | POA: Diagnosis not present

## 2016-12-18 DIAGNOSIS — E7401 von Gierke disease: Secondary | ICD-10-CM | POA: Diagnosis present

## 2016-12-18 DIAGNOSIS — Z882 Allergy status to sulfonamides status: Secondary | ICD-10-CM | POA: Diagnosis not present

## 2016-12-18 DIAGNOSIS — C3482 Malignant neoplasm of overlapping sites of left bronchus and lung: Secondary | ICD-10-CM

## 2016-12-18 DIAGNOSIS — R64 Cachexia: Secondary | ICD-10-CM | POA: Diagnosis present

## 2016-12-18 DIAGNOSIS — C782 Secondary malignant neoplasm of pleura: Secondary | ICD-10-CM | POA: Diagnosis present

## 2016-12-18 LAB — BASIC METABOLIC PANEL
Anion gap: 12 (ref 5–15)
BUN: 8 mg/dL (ref 6–20)
CALCIUM: 10 mg/dL (ref 8.9–10.3)
CO2: 24 mmol/L (ref 22–32)
Chloride: 99 mmol/L — ABNORMAL LOW (ref 101–111)
Creatinine, Ser: 0.74 mg/dL (ref 0.61–1.24)
GFR calc Af Amer: >60 mL/min (ref >60)
Glucose, Bld: 100 mg/dL — ABNORMAL HIGH (ref 65–99)
POTASSIUM: 4.9 mmol/L (ref 3.5–5.1)
Sodium: 135 mmol/L (ref 135–145)

## 2016-12-18 LAB — CBC
HEMATOCRIT: 33.9 % — AB (ref 39.0–52.0)
Hemoglobin: 11.4 g/dL — ABNORMAL LOW (ref 13.0–17.0)
MCH: 32.8 pg (ref 26.0–34.0)
MCHC: 33.6 g/dL (ref 30.0–36.0)
MCV: 97.4 fL (ref 78.0–100.0)
PLATELETS: 362 10*3/uL (ref 150–400)
RBC: 3.48 MIL/uL — ABNORMAL LOW (ref 4.22–5.81)
RDW: 13 % (ref 11.5–15.5)
WBC: 7.1 10*3/uL (ref 4.0–10.5)

## 2016-12-18 LAB — I-STAT TROPONIN, ED: TROPONIN I, POC: 0 ng/mL (ref 0.00–0.08)

## 2016-12-18 MED ORDER — ONDANSETRON HCL 4 MG PO TABS: 4.0000 mg | ORAL_TABLET | Freq: Four times a day (QID) | ORAL | Status: DC | PRN

## 2016-12-18 MED ORDER — ELBASVIR-GRAZOPREVIR 50-100 MG PO TABS
1.0000 | ORAL_TABLET | Freq: Every day | ORAL | Status: AC
  Administered 2016-12-19: 11:00:00 via ORAL
  Filled 2016-12-18: qty 1

## 2016-12-18 MED ORDER — ONDANSETRON HCL 4 MG/2ML IJ SOLN: 4.0000 mg | Freq: Four times a day (QID) | INTRAMUSCULAR | Status: DC | PRN

## 2016-12-18 MED ORDER — IOPAMIDOL (ISOVUE-300) INJECTION 61%
INTRAVENOUS | Status: AC
  Administered 2016-12-18: 18:00:00
  Filled 2016-12-18: qty 100

## 2016-12-18 MED ORDER — GADOBENATE DIMEGLUMINE 529 MG/ML IV SOLN
10.0000 mL | Freq: Once | INTRAVENOUS | Status: AC | PRN
  Administered 2016-12-18: 10 mL via INTRAVENOUS

## 2016-12-18 MED ORDER — SODIUM CHLORIDE 0.9% FLUSH
3.0000 mL | Freq: Two times a day (BID) | INTRAVENOUS | Status: DC
  Administered 2016-12-18 – 2016-12-20 (×4): 3 mL via INTRAVENOUS

## 2016-12-18 MED ORDER — HYDROCODONE-ACETAMINOPHEN 5-325 MG PO TABS
1.0000 | ORAL_TABLET | Freq: Four times a day (QID) | ORAL | Status: DC | PRN
  Administered 2016-12-19 – 2016-12-20 (×3): 1 via ORAL
  Filled 2016-12-18 (×3): qty 1

## 2016-12-18 MED ORDER — ACETAMINOPHEN 650 MG RE SUPP: 650.0000 mg | Freq: Four times a day (QID) | RECTAL | Status: DC | PRN

## 2016-12-18 MED ORDER — ACETAMINOPHEN 325 MG PO TABS: 650.0000 mg | ORAL_TABLET | Freq: Four times a day (QID) | ORAL | Status: DC | PRN

## 2016-12-18 MED ORDER — ENOXAPARIN SODIUM 40 MG/0.4ML ~~LOC~~ SOLN
40.0000 mg | SUBCUTANEOUS | Status: DC
  Administered 2016-12-18 – 2016-12-19 (×2): 40 mg via SUBCUTANEOUS
  Filled 2016-12-18 (×2): qty 0.4

## 2016-12-18 MED ORDER — NAPROXEN 500 MG PO TABS
500.0000 mg | ORAL_TABLET | Freq: Two times a day (BID) | ORAL | Status: DC | PRN
  Filled 2016-12-18: qty 1

## 2016-12-18 MED ORDER — NAPROXEN 250 MG PO TABS: 500.0000 mg | ORAL_TABLET | Freq: Two times a day (BID) | ORAL | Status: DC

## 2016-12-18 NOTE — ED Triage Notes (Signed)
Presents with c/o SOB, back pain. He was here last week for pleural effusion with thoracentesis, followed up in PCP office today and they wanted to come back to ER due to no improvement in symptoms. He reports continued chest tightness, pain in his back, SOB, cough since he was discharged from [facility] and now his is starting to feel weak and dizzy.

## 2016-12-18 NOTE — Progress Notes (Signed)
   12/18/16 2026  Vitals  Temp 98.8 F (37.1 C)  Temp Source Oral  BP (!) 156/91  MAP (mmHg) 111  BP Location Right Arm  BP Method Automatic  Patient Position (if appropriate) Lying  Pulse Rate Source Monitor  Resp (!) 21  Oxygen Therapy  SpO2 97 %  O2 Device Room Air  Height and Weight  Height '5\' 9"'$  (1.753 m)  Weight 63.5 kg (139 lb 14.4 oz)  Type of Scale Used Standing  Type of Weight Actual  BSA (Calculated - sq m) 1.76 sq meters  BMI (Calculated) 20.7  Weight in (lb) to have BMI = 25 168.9  Admitted pt to rm 3W16 from ED, pt alert and oriented, denied pain at this time, oriented to room, call bell placed within reach.

## 2016-12-18 NOTE — ED Notes (Signed)
Attempted report 

## 2016-12-18 NOTE — Progress Notes (Signed)
Pt going for MRI, Dr. Gay Filler notified if patient can go without cardiac monitor, MD ok for pt to go to MRI without cardiac monitor.

## 2016-12-18 NOTE — ED Provider Notes (Signed)
[facility] DEPT Provider Note   CSN: [id number] Arrival date & time: 12/18/16  1252     History   Chief Complaint Chief Complaint  Patient presents with  . Shortness of Breath    HPI Joshua White is a 66 y.o. male. Pt p/w shortness of breath and generalized weakness. He was seen here on 12/11/16 and underwent US guided thoracentesis of left-sided pleural effusion. He is currently being treated for HCV and has been losing weight. He has had no improvement in his sx since his thoracentesis. CXR has shown LUL mass with CT recommended. Pt followed up with Dr. Hardin Negus today ([place]) who sent pt here for further evaluation d/t progressive dyspnea.  HPI  Past Medical History:  Diagnosis Date  . 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 H/O orthostatic hypotension   . Pneumonia   . RBBB (right bundle branch block)   . Seizures [facility])     Patient Active Problem List   Diagnosis Date Noted  . Chronic hepatitis C without hepatic coma ([place]) 09/18/2016  . Sessile colonic polyp 09/18/2016  . 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 G6PD deficiency ([place]) 09/18/2016  . Esophageal reflux 09/18/2016  . Syncope 09/18/2016  . Chronic alcoholism in remission ([place]) 09/18/2016  . Osteoarthritis of left shoulder 09/18/2016    Past Surgical History:  Procedure Laterality Date  . APPENDECTOMY    . US ECHOCARDIOGRAPHY  05/12/2008   EF 55-60%       Home Medications    Prior to Admission medications   Medication Sig Start Date End Date Taking? Authorizing Provider  Elbasvir-Grazoprevir (ZEPATIER) 50-100 MG TABS Take 1 tablet by mouth daily. 09/26/16   Thayer Headings, MD  HYDROcodone-acetaminophen (NORCO/VICODIN) 5-325 MG per tablet Take 1 tablet by mouth every 6 (six) hours as needed for moderate pain. Patient not taking: Reported on 10/17/2016 08/03/14   Dalia Heading, PA-C  ibuprofen (ADVIL,MOTRIN) 200 MG tablet Take 200 mg by mouth every 4 (four) hours as needed  for mild pain.    Historical Provider, MD  ibuprofen (ADVIL,MOTRIN) 800 MG tablet Take 1 tablet (800 mg total) by mouth every 8 (eight) hours as needed. Patient not taking: Reported on 12/11/2016 08/03/14   Dalia Heading, PA-C  naproxen (NAPROSYN) 500 MG tablet Take 1 tablet (500 mg total) by mouth 2 (two) times daily with a meal. Patient not taking: Reported on 10/17/2016 05/29/14   Monico Blitz, PA-C    Family History Family History  Problem Relation Age of Onset  . Lung cancer Mother   . Brain cancer Mother   . Heart disease Father   . Hypertension Father   . Heart attack Father     Social History Social History  Substance Use Topics  . Smoking status: Current Every Day Smoker    Types: Cigars  . Smokeless tobacco: Never Used  . Alcohol use Yes     Allergies   Tuberculin purified protein derivative and Sulfa antibiotics   Review of Systems Review of Systems  Constitutional: Positive for fatigue. Negative for chills and fever.  HENT: Negative for ear pain and sore throat.   Eyes: Negative for pain and visual disturbance.  Respiratory: Positive for shortness of breath. Negative for cough.   Cardiovascular: Negative for chest pain and palpitations.  Gastrointestinal: Negative for abdominal pain and vomiting.  Genitourinary: Negative for dysuria and hematuria.  Musculoskeletal: Positive for back pain. Negative for arthralgias.  Skin: Negative for color change and rash.  Neurological: Positive for weakness. Negative for seizures and syncope.  All other systems reviewed and are negative.    Physical Exam Updated Vital Signs BP 123/66 (BP Location: Left Arm)   Pulse 95   Temp 97.9 F (36.6 C) (Oral)   Resp 18   SpO2 100%   Physical Exam  Constitutional: He is oriented to person, place, and time. He appears well-developed.  HENT:  Head: Normocephalic and atraumatic.  Eyes: Conjunctivae are normal.  Neck: Neck supple.  Cardiovascular: Normal rate and  regular rhythm.   No murmur heard. Pulmonary/Chest: Effort normal. No respiratory distress. He has no wheezes. He has no rales.  Markedly diminished L sided breath sounds  Abdominal: Soft. He exhibits no distension and no mass. There is no tenderness. There is no guarding.  Musculoskeletal: He exhibits no edema, tenderness or deformity.  Neurological: He is alert and oriented to person, place, and time.  Walks with normal gait. Equal strength in all extremities.  Skin: Skin is warm and dry.  Psychiatric: He has a normal mood and affect.  Nursing note and vitals reviewed.    ED Treatments / Results  Labs (all labs ordered are listed, but only abnormal results are displayed) Labs Reviewed  BASIC METABOLIC PANEL - Abnormal; Notable for the following:       Result Value   Chloride 99 (*)    Glucose, Bld 100 (*)    All other components within normal limits  CBC - Abnormal; Notable for the following:    RBC 3.48 (*)    Hemoglobin 11.4 (*)    HCT 33.9 (*)    All other components within normal limits  I-STAT TROPOININ, ED    EKG  EKG Interpretation None       Radiology Dg Chest 2 View  Result Date: 12/18/2016 CLINICAL DATA:  Shortness of breath and cough.  Chest pain. EXAM: CHEST  2 VIEW COMPARISON:  Seven days prior FINDINGS: Persistently enlarged left pleural effusion, reaching the apex. Masslike appearance over the medial left apex, superimposed on the aortic knob. Mild or atelectasis at the right base. Stable non obscured heart size. IMPRESSION: Large left pleural effusion with concerning masslike appearance at the left apex. Recommend chest CT with contrast. Electronically Signed   By: Monte Fantasia M.D.   On: 12/18/2016 14:14    Procedures Procedures (including critical care time)  Medications Ordered in ED Medications - No data to display   Initial Impression / Assessment and Plan / ED Course  I have reviewed the triage vital signs and the nursing  notes.  Pertinent labs & imaging results that were available during my care of the patient were reviewed by me and considered in my medical decision making (see chart for details).    Pt p/w progressive SOB and generalized weakness in setting of known L pleural effusion. He was here 12/11/16 for same, underwent US-guided thoracentesis with IR and was discharged with OP f/u. His symptoms have worsened since being seen here. WBC normal. Hgb 11.4 from 11.7 last week. Last week there were 1.3 L of serosanguineous fluid taken from left pleural space. Procedure terminated d/t chest pain and coughing. Pathology from fluid showed atypical cells, concerning for malignancy.  Pt will be admitted for symptom control and further workup of his likely malignant effusion. CT chest ordered. Pt is hemodynamically stable and oxygenating well on room air.  Final Clinical Impressions(s) / ED Diagnoses   Final diagnoses:  Recurrent left pleural effusion  SOB (shortness  of breath)    New Prescriptions New Prescriptions   No medications on file      Clifton James, MD 12/18/16 [place], MD 12/18/16 [phone]

## 2016-12-18 NOTE — H&P (Signed)
Date: 12/18/2016               Patient Name:  Joshua White MRN: [id number]  DOB: 1951-10-11 Age / Sex: 66 y.o., male   PCP: No Pcp Per Patient         Medical Service: Internal Medicine Teaching Service         Attending Physician: Dr. Aldine Contes, MD    First Contact: Dr. Ophelia Shoulder Pager: [phone]  Second Contact: Dr. Zada Finders Pager: [phone]       After Hours (After 5p/  First Contact Pager: [phone]  weekends / holidays): Second Contact Pager: [phone]   Chief Complaint: Shortness of breath  History of Present Illness: The patient is a 66 year old African-American male with a past medical history of orthostatic hypotension, G6PD deficiency, right bundle branch block and a significant tobacco abuse history who presents from his doctor's office with shortness of breath and pleural effusion. Patient was seen in the emergency department last week for shortness of breath and found to have a large pleural effusion. Approximately 1 L of this fluid was removed and sent for analysis. Analysis was consistent with an exudate and showed atypical cells concerning for malignancy. He was scheduled to follow-up with his primary care physician which he did today. At that visit the patient was noted to be short of breath and was sent to the emergency department for admission and further evaluation. Speaking with the patient today he endorses shortness of breath that started approximately 3 weeks ago. It worsened over the interval prompting him to present to the emergency department one week ago for thoracentesis. Following this procedure he had relief for approximately 4-5 days with symptoms gradually worsening over the interval until he was seen at the office today. The patient endorses a history of drenching night sweats, fatigue and approximate 20 pound weight loss over the past 3 months. He has a significant tobacco abuse history and endorses smoking at least 2 packs of cigarettes per day  for a minimum of 30 years. He has some pleuritic chest pain associated with shortness of breath. He denies nausea or vomiting. He denies diarrhea. He denies polyuria or dysuria. He also endorses mild constipation.  In the emergency department the patient was afebrile and hemodynamically stable. He is satting 100% oxygen on room air. Chest radiograph showed large left pleural effusion with concerning masslike appearance of the left apex. Chest CT shows extremely large left sided pleural effusion occupying the majority of the left hemithorax. Multiple areas of infiltrating masslike structures and potential metastatic disease. For further details please see official radiology report. The patient was then admitted to the inpatient medicine service for further management and evaluation.  Meds:  Current Meds  Medication Sig  . aspirin EC 81 MG tablet Take 162 mg by mouth daily.  . Elbasvir-Grazoprevir (ZEPATIER) 50-100 MG TABS Take 1 tablet by mouth daily.  [name] ibuprofen (ADVIL,MOTRIN) 200 MG tablet Take 200 mg by mouth every 4 (four) hours as needed for mild pain.     Allergies: Allergies as of 12/18/2016 - Review Complete 12/18/2016  Allergen Reaction Noted  . Tuberculin purified protein derivative Swelling 03/29/2016  . Sulfa antibiotics  09/13/2011   Past Medical History:  Diagnosis Date  . G6PD deficiency ([place])   . H/O orthostatic hypotension   . Pneumonia   . RBBB (right bundle branch block)   . Seizures ([place])     Family History:  Family History  Problem Relation  Age of Onset  . Lung cancer Mother   . Brain cancer Mother   . Heart disease Father   . Hypertension Father   . Heart attack Father      Social History:  Social History   Social History  . Marital status: Single    Spouse name: N/A  . Number of children: N/A  . Years of education: N/A   Occupational History  . Not on file.   Social History Main Topics  . Smoking status: Former Smoker    Packs/day: 2.00     Years: 30.00    Types: Cigars  . Smokeless tobacco: Never Used     Comment: Quit ~2017  . Alcohol use No     Comment: Former use  . Drug use: No  . Sexual activity: Not on file   Other Topics Concern  . Not on file   Social History Narrative  . No narrative on file     Review of Systems: A complete ROS was negative except as per HPI.   Physical Exam: Blood pressure 151/92, pulse 88, temperature 97.9 F (36.6 C), temperature source Oral, resp. rate 20, SpO2 97 %. Physical Exam  Constitutional: He is oriented to person, place, and time.  Cachectic  HENT:  Head: Normocephalic and atraumatic.  Cardiovascular: Normal rate and regular rhythm.   Respiratory:  Decreased breath sounds on the left compared with the right. Moving small volumes of air on the left. Dullness to percussion on the left lung base extending superiorly  GI: Soft. Bowel sounds are normal. He exhibits no distension. There is no tenderness.  Musculoskeletal: He exhibits no edema.  Neurological: He is alert and oriented to person, place, and time.  No focal neurological deficits appreciated. Cranial nerves grossly intact. Strength 5 out of 5 in upper and lower extrimities bilaterally.     EKG: No acute ST segment elevation  CXR: Large left pleural effusion with concerning masslike appearance of the left apex  Assessment & Plan by Problem: Principal Problem:   Pleural effusion on left Active Problems:   Chronic hepatitis C without hepatic coma (HCC)   Chronic alcoholism in remission [facility])  The patient is a 66 year old African-American male who presents with recurrent shortness of breath found to have pleural effusion on chest radiograph and CT imaging concerning for metastatic cancer.  # Possible metastatic disease Patient presents with B symptoms and a significant tobacco abuse history. Prior pleural fluid analysis demonstrated an exudate with atypical cells concerning for malignancy. CT chest shows a  large left-sided pleural effusion which occupies the majority of the left hemithorax. Additional CT chest imaging shows 2.1 cm nodular opacity at the right lung base and scattered nodules about the left epicardial fat pad most likely consistent with metastatic disease. Given the patient's presentation, pleural fluid and CT imaging I'm most concerned with metastatic disease. Given the patient's pleural effusion showed atypical cells this would place him at a minimum of stage IV disease and thus we will proceed with CT contrast chest as well as abdomen to evaluate for hepatic or adrenal metastasis. Additionally, although negative for focal neurological deficits given the high rate of occult central nervous system metastases in patients with advanced lung cancer we'll also proceed with MRI brain. Given his substantial pleural effusion we will also request an IR guided thoracentesis for symptom management and ongoing diagnostic data. -- Follow-up CT chest, abdomen and pelvis -- MRI brain -- IR guided thoracentesis with the following: Glucose, protein, LDH,  cell count and cytology -- Oncology consultation tomorrow -- Norco 5-325 every 6 hours when necessary pain  # Hepatitis C virus Patient has a history of hepatitis C virus. He is currently undergoing curative treatment. -- Continue treatment  DVT/PE prophylaxis: Lovenox FEN/GI: Normal diet Code: Full code  Dispo: Admit patient to Observation with expected length of stay less than 2 midnights.  Signed: Ophelia Shoulder, MD 12/18/2016, 6:57 PM  Pager: [phone]

## 2016-12-19 ENCOUNTER — Observation Stay (HOSPITAL_COMMUNITY): Payer: Medicare Other

## 2016-12-19 DIAGNOSIS — R918 Other nonspecific abnormal finding of lung field: Secondary | ICD-10-CM | POA: Diagnosis not present

## 2016-12-19 DIAGNOSIS — Z808 Family history of malignant neoplasm of other organs or systems: Secondary | ICD-10-CM | POA: Diagnosis not present

## 2016-12-19 DIAGNOSIS — C3482 Malignant neoplasm of overlapping sites of left bronchus and lung: Secondary | ICD-10-CM | POA: Diagnosis not present

## 2016-12-19 DIAGNOSIS — I4510 Unspecified right bundle-branch block: Secondary | ICD-10-CM | POA: Diagnosis present

## 2016-12-19 DIAGNOSIS — Z79899 Other long term (current) drug therapy: Secondary | ICD-10-CM | POA: Diagnosis not present

## 2016-12-19 DIAGNOSIS — Z888 Allergy status to other drugs, medicaments and biological substances status: Secondary | ICD-10-CM | POA: Diagnosis not present

## 2016-12-19 DIAGNOSIS — D649 Anemia, unspecified: Secondary | ICD-10-CM | POA: Diagnosis present

## 2016-12-19 DIAGNOSIS — Z7982 Long term (current) use of aspirin: Secondary | ICD-10-CM | POA: Diagnosis not present

## 2016-12-19 DIAGNOSIS — E440 Moderate protein-calorie malnutrition: Secondary | ICD-10-CM

## 2016-12-19 DIAGNOSIS — Z87891 Personal history of nicotine dependence: Secondary | ICD-10-CM

## 2016-12-19 DIAGNOSIS — R569 Unspecified convulsions: Secondary | ICD-10-CM | POA: Diagnosis present

## 2016-12-19 DIAGNOSIS — Z8249 Family history of ischemic heart disease and other diseases of the circulatory system: Secondary | ICD-10-CM | POA: Diagnosis not present

## 2016-12-19 DIAGNOSIS — B1920 Unspecified viral hepatitis C without hepatic coma: Secondary | ICD-10-CM | POA: Diagnosis not present

## 2016-12-19 DIAGNOSIS — J90 Pleural effusion, not elsewhere classified: Secondary | ICD-10-CM

## 2016-12-19 DIAGNOSIS — R0602 Shortness of breath: Secondary | ICD-10-CM | POA: Diagnosis present

## 2016-12-19 DIAGNOSIS — Z9889 Other specified postprocedural states: Secondary | ICD-10-CM | POA: Diagnosis not present

## 2016-12-19 DIAGNOSIS — Z801 Family history of malignant neoplasm of trachea, bronchus and lung: Secondary | ICD-10-CM | POA: Diagnosis not present

## 2016-12-19 DIAGNOSIS — C3490 Malignant neoplasm of unspecified part of unspecified bronchus or lung: Secondary | ICD-10-CM | POA: Diagnosis not present

## 2016-12-19 DIAGNOSIS — K5900 Constipation, unspecified: Secondary | ICD-10-CM | POA: Diagnosis not present

## 2016-12-19 DIAGNOSIS — Z882 Allergy status to sulfonamides status: Secondary | ICD-10-CM | POA: Diagnosis not present

## 2016-12-19 DIAGNOSIS — Z77090 Contact with and (suspected) exposure to asbestos: Secondary | ICD-10-CM

## 2016-12-19 DIAGNOSIS — R634 Abnormal weight loss: Secondary | ICD-10-CM | POA: Diagnosis not present

## 2016-12-19 DIAGNOSIS — B182 Chronic viral hepatitis C: Secondary | ICD-10-CM | POA: Diagnosis present

## 2016-12-19 DIAGNOSIS — C782 Secondary malignant neoplasm of pleura: Secondary | ICD-10-CM | POA: Diagnosis present

## 2016-12-19 DIAGNOSIS — J910 Malignant pleural effusion: Secondary | ICD-10-CM | POA: Diagnosis not present

## 2016-12-19 DIAGNOSIS — Z681 Body mass index (BMI) 19 or less, adult: Secondary | ICD-10-CM | POA: Diagnosis not present

## 2016-12-19 DIAGNOSIS — F1021 Alcohol dependence, in remission: Secondary | ICD-10-CM | POA: Diagnosis present

## 2016-12-19 DIAGNOSIS — E7401 von Gierke disease: Secondary | ICD-10-CM | POA: Diagnosis present

## 2016-12-19 DIAGNOSIS — J939 Pneumothorax, unspecified: Secondary | ICD-10-CM | POA: Diagnosis not present

## 2016-12-19 DIAGNOSIS — R64 Cachexia: Secondary | ICD-10-CM | POA: Diagnosis present

## 2016-12-19 DIAGNOSIS — C3492 Malignant neoplasm of unspecified part of left bronchus or lung: Secondary | ICD-10-CM | POA: Diagnosis present

## 2016-12-19 DIAGNOSIS — R630 Anorexia: Secondary | ICD-10-CM | POA: Diagnosis not present

## 2016-12-19 LAB — COMPREHENSIVE METABOLIC PANEL
ALT: 10 U/L — AB (ref 17–63)
ALT: 10 U/L — ABNORMAL LOW (ref 17–63)
AST: 19 U/L (ref 15–41)
AST: 18 U/L (ref 15–41)
Albumin: 2.9 g/dL — ABNORMAL LOW (ref 3.5–5.0)
Albumin: 2.8 g/dL — ABNORMAL LOW (ref 3.5–5.0)
Alkaline Phosphatase: 61 U/L (ref 38–126)
Alkaline Phosphatase: 67 U/L (ref 38–126)
Anion gap: 10 (ref 5–15)
Anion gap: 10 (ref 5–15)
BUN: 10 mg/dL (ref 6–20)
BUN: 15 mg/dL (ref 6–20)
CHLORIDE: 101 mmol/L (ref 101–111)
CO2: 23 mmol/L (ref 22–32)
CO2: 25 mmol/L (ref 22–32)
CREATININE: 0.72 mg/dL (ref 0.61–1.24)
Calcium: 9.7 mg/dL (ref 8.9–10.3)
Calcium: 9.8 mg/dL (ref 8.9–10.3)
Chloride: 101 mmol/L (ref 101–111)
Creatinine, Ser: 0.81 mg/dL (ref 0.61–1.24)
GFR calc Af Amer: >60 mL/min (ref >60)
GFR calc non Af Amer: >60 mL/min (ref >60)
GFR calc non Af Amer: >60 mL/min (ref >60)
Glucose, Bld: 82 mg/dL (ref 65–99)
Glucose, Bld: 128 mg/dL — ABNORMAL HIGH (ref 65–99)
Potassium: 4.3 mmol/L (ref 3.5–5.1)
Potassium: 4 mmol/L (ref 3.5–5.1)
SODIUM: 134 mmol/L — AB (ref 135–145)
Sodium: 136 mmol/L (ref 135–145)
Total Bilirubin: 0.9 mg/dL (ref 0.3–1.2)
Total Bilirubin: 0.8 mg/dL (ref 0.3–1.2)
Total Protein: 8.5 g/dL — ABNORMAL HIGH (ref 6.5–8.1)
Total Protein: 8.5 g/dL — ABNORMAL HIGH (ref 6.5–8.1)

## 2016-12-19 LAB — BODY FLUID CELL COUNT WITH DIFFERENTIAL
EOS FL: 0 %
LYMPHS FL: 100 %
Monocyte-Macrophage-Serous Fluid: 0 % — ABNORMAL LOW (ref 50–90)
NEUTROPHIL FLUID: 0 % (ref 0–25)
Total Nucleated Cell Count, Fluid: 5412 cu mm — ABNORMAL HIGH (ref 0–1000)

## 2016-12-19 LAB — CBC
HCT: 33.2 % — ABNORMAL LOW (ref 39.0–52.0)
Hemoglobin: 11.1 g/dL — ABNORMAL LOW (ref 13.0–17.0)
MCH: 32.6 pg (ref 26.0–34.0)
MCHC: 33.4 g/dL (ref 30.0–36.0)
MCV: 97.6 fL (ref 78.0–100.0)
Platelets: 354 10*3/uL (ref 150–400)
RBC: 3.4 MIL/uL — ABNORMAL LOW (ref 4.22–5.81)
RDW: 12.9 % (ref 11.5–15.5)
WBC: 6.6 10*3/uL (ref 4.0–10.5)

## 2016-12-19 LAB — CBC WITH DIFFERENTIAL/PLATELET
Basophils Absolute: 0 10*3/uL (ref 0.0–0.1)
Basophils Relative: 0 %
EOS PCT: 1 %
Eosinophils Absolute: 0.1 10*3/uL (ref 0.0–0.7)
HEMATOCRIT: 32.3 % — AB (ref 39.0–52.0)
Hemoglobin: 10.5 g/dL — ABNORMAL LOW (ref 13.0–17.0)
LYMPHS PCT: 22 %
Lymphs Abs: 1.5 10*3/uL (ref 0.7–4.0)
MCH: 32 pg (ref 26.0–34.0)
MCHC: 32.5 g/dL (ref 30.0–36.0)
MCV: 98.5 fL (ref 78.0–100.0)
Monocytes Absolute: 0.7 10*3/uL (ref 0.1–1.0)
Monocytes Relative: 10 %
Neutro Abs: 4.7 10*3/uL (ref 1.7–7.7)
Neutrophils Relative %: 67 %
PLATELETS: 320 10*3/uL (ref 150–400)
RBC: 3.28 MIL/uL — AB (ref 4.22–5.81)
RDW: 13.2 % (ref 11.5–15.5)
WBC: 7 10*3/uL (ref 4.0–10.5)

## 2016-12-19 LAB — ABO/RH: ABO/RH(D): A POS

## 2016-12-19 LAB — APTT: aPTT: 46 seconds — ABNORMAL HIGH (ref 24–36)

## 2016-12-19 LAB — GLUCOSE, PLEURAL OR PERITONEAL FLUID: Glucose, Fluid: 59 mg/dL

## 2016-12-19 LAB — GRAM STAIN

## 2016-12-19 LAB — PROTEIN, PLEURAL OR PERITONEAL FLUID: Total protein, fluid: 5.6 g/dL

## 2016-12-19 LAB — LACTATE DEHYDROGENASE: LDH: 147 U/L (ref 98–192)

## 2016-12-19 LAB — PROTIME-INR
INR: 1.16
Prothrombin Time: 14.9 seconds (ref 11.4–15.2)

## 2016-12-19 LAB — TYPE AND SCREEN
ABO/RH(D): A POS
Antibody Screen: NEGATIVE

## 2016-12-19 LAB — LACTATE DEHYDROGENASE, PLEURAL OR PERITONEAL FLUID: LD FL: 357 U/L — AB (ref 3–23)

## 2016-12-19 LAB — MRSA PCR SCREENING: MRSA BY PCR: NEGATIVE

## 2016-12-19 MED ORDER — MORPHINE SULFATE (PF) 2 MG/ML IV SOLN
2.0000 mg | Freq: Once | INTRAVENOUS | Status: AC
  Administered 2016-12-19: 2 mg via INTRAVENOUS
  Filled 2016-12-19: qty 1

## 2016-12-19 MED ORDER — DEXTROSE 5 % IV SOLN
1.5000 g | INTRAVENOUS | Status: AC
  Administered 2016-12-20: 1.5 g via INTRAVENOUS
  Filled 2016-12-19 (×2): qty 1.5

## 2016-12-19 MED ORDER — ENSURE ENLIVE PO LIQD
237.0000 mL | Freq: Three times a day (TID) | ORAL | Status: DC
  Administered 2016-12-19 – 2016-12-25 (×13): 237 mL via ORAL

## 2016-12-19 MED ORDER — LIDOCAINE HCL (PF) 1 % IJ SOLN
INTRAMUSCULAR | Status: AC
  Administered 2016-12-19: 10:00:00
  Filled 2016-12-19: qty 10

## 2016-12-19 NOTE — Progress Notes (Signed)
   Subjective: The patient was seen this morning following his ultrasound guided thoracentesis. He was recovering well but is having some pleuritic chest pain. He said his breathing was much better following the removal of this fluid. We discussed with him the potential diagnosis of having lung cancer. We informed him that we would have several other doctors stopping by today to speak with him including the medical oncologist. He was amenable to this plan. Outside of his pleuritic chest pain he had no additional acute complaints or concerns.  Objective:  Vital signs in last 24 hours: Vitals:   12/19/16 0500 12/19/16 0818 12/19/16 0848 12/19/16 1019  BP: 116/83 119/86  (!) 145/77  Pulse: 87   77  Resp: (!) 23     Temp: 98.9 F (37.2 C)  99.2 F (37.3 C)   TempSrc:   Oral   SpO2: 95%  95% 97%  Weight: 139 lb 11.2 oz (63.4 kg)     Height:       Physical Exam  Constitutional: He is oriented to person, place, and time.  Cachectic  HENT:  Head: Normocephalic and atraumatic.  Cardiovascular: Normal rate and regular rhythm.   Respiratory: Effort normal.  Diminished breath sounds on the left compared with the right.  GI: Soft. Bowel sounds are normal. He exhibits no distension. There is no tenderness.  Musculoskeletal: He exhibits no edema.  Neurological: He is alert and oriented to person, place, and time.     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  # Possible metastatic disease ## Left Pleural Effusion Patient had left ultrasound-guided thoracentesis today with removal of  approximately 1.1 L of bloody fluid. Initial fluid analysis consistent with exudate. Cytology pending. Prior thoracentesis also consistent with exudate with atypical cells. Given his clinical presentation, pleural fluid analysis and CT imaging I'm concerned for malignancy with metastatic disease. His risk factors and prior exposure of tobacco as well as potentially to asbestos I'm concerned for mesothelioma or primary lung cancer. I have consulted medical oncology and we appreciate their recommendations. I have also consulted pulmonary and critical care medicine and I greatly appreciate their recommendation and lastly, we have consulted CVTS for potential pleural biopsy with VATS and consideration for pleurodesis versus Pleurx catheter for recurrent malignant pleural effusions. CT abdomen and pelvis did not show distant metastatic disease of the adrenals or liver. MRI brain did not show metastatic disease within the brain. For now we will wait for further recommendations from our many consultants and aim to treat the patient's pain. -- Follow-up cytology from thoracentesis -- Follow-up pulmonary recommendation -- Follow-up medical oncology recommendations -- Follow-up cardiovascular and thoracic surgery recommendations -- Norco one tablet every 6 hours when necessary pain  # Hepatitis C virus Patient has a history of hepatitis C virus. He is currently undergoing curative treatment. -- Continue treatment  DVT/PE prophylaxis: Lovenox FEN/GI: Normal diet Code: Full code   Dispo: Anticipated discharge pending clinical course  Ophelia Shoulder, MD 12/19/2016, 1:32 PM Pager: [phone]

## 2016-12-19 NOTE — Consult Note (Signed)
[name]    HEMATOLOGY/ONCOLOGY CONSULTATION NOTE  Date of Service: 12/19/2016  Patient Care Team: No Pcp Per Patient as PCP - General (General Practice)  CHIEF COMPLAINTS/PURPOSE OF CONSULTATION:  Concern for metastatic lung cancer with malignant pleural effusion.  HISTORY OF PRESENTING ILLNESS:   Joshua White is a 66 y.o. male who has been referred to [place] by Dr Aldine Contes, MD for evaluation and management of likely metastatic lung cancer.  Patient has a history of seizures, G6PD deficiency, right bundle branch block , heavy smoking history and hepatitis C and is just about done with his 12 week treatment with Harvoni for his hepatitis C. Patient notes that he has had progressive fatigue and anorexia for the last 2-3 months. He initially assigned his symptoms to his treatment for hepatitis C with Harvoni but was concerned when the symptoms persisted. He notes he has lost about 15-20 pounds over the last couple of months. She presented to the emergency room about a week ago with significant shortness of breath for 2-3 weeks  and was noted to have a large pleural effusion and had a therapeutic thoracentesis with removal of 1 L fluid which was sent for analysis. It was noted to be exudative with cytology concerning for atypical cells which are likely malignant.  Patient presented to his primary care physician in follow-up and was noted to have significant shortness of breath again and recurrent large left-sided pleural effusion for which she was admitted. He had a CT of the chest which showed an extremely large left-sided pleural effusion occupying majority of the left hemithorax with dense consolidation of most of the left lung. Diffuse infiltrating mass along the left side of the mediastinum measuring 3.6 cm and extending into much of the pleura and left hemithorax . He was also noted to have mediastinal lymphadenopathy and a 2.1 cm nodular opacity in the right lung . Concern for possible  metastases in the epicardial fat pad . Also had mildly prominent retroperitoneal lymph nodes measuring about 8 mm which were nonspecific . CT abdomen showed no other obvious metastatic disease.  Patient had an MRI of the brain which shows no evidence of metastatic disease. 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  Patient has had a repeat therapeutic thoracentesis today cytology from this is currently pending. Cardiothoracic surgery was consulted for his recurrent large pleural effusion and there are plans for a VATS procedure with pleural biopsy and possible placement of a Pleurx catheter on 12/20/2016.  Patient notes his breathing is somewhat better after the thoracentesis. Has some left sided chest pain.  He notes that he smoked about 1-2 packs a day since age 4 and quit about a year ago. No headaches no focal neurological deficits. No abdominal pain or distention. Weight loss of about 20 pounds over the last couple months.  MEDICAL HISTORY:  Past Medical History:  Diagnosis Date  . G6PD deficiency ([place])   . H/O orthostatic hypotension   . Pneumonia   . RBBB (right bundle branch block)   . Seizures ([place])   Hep C - just finishing treatment with Harvoni  SURGICAL HISTORY: Past Surgical History:  Procedure Laterality Date  . APPENDECTOMY    . US ECHOCARDIOGRAPHY  05/12/2008   EF 55-60%    SOCIAL HISTORY: Social History   Social History  . Marital status: Single    Spouse name: N/A  . Number of children: N/A  . Years of education: N/A  Occupational History  . Not on file.   Social History Main Topics  . Smoking status: Former Smoker    Packs/day: 2.00    Years: 30.00    Types: Cigars  . Smokeless tobacco: Never Used     Comment: Quit ~2017  . Alcohol use No     Comment: Former use  . Drug use: No  . Sexual activity: Not on file   Other Topics Concern  . Not on file   Social History Narrative  . No narrative on file      FAMILY HISTORY: Family History  Problem Relation Age of Onset  . Lung cancer Mother   . Brain cancer Mother   . Heart disease Father   . Hypertension Father   . Heart attack Father     ALLERGIES:  is allergic to tuberculin purified protein derivative and sulfa antibiotics.  MEDICATIONS:  Current Facility-Administered Medications  Medication Dose Route Frequency Provider Last Rate Last Dose  . cefUROXime (ZINACEF) 1.5 g in dextrose 5 % 50 mL IVPB  1.5 g Intravenous 60 min Pre-Op Grace Isaac, MD      . Doug Sou Hold] enoxaparin (LOVENOX) injection 40 mg  40 mg Subcutaneous Q24H Zada Finders, MD   40 mg at 12/19/16 2159  . [MAR Hold] feeding supplement (ENSURE ENLIVE) (ENSURE ENLIVE) liquid 237 mL  237 mL Oral TID BM Nischal Narendra, MD   237 mL at 12/19/16 1911  . [MAR Hold] HYDROcodone-acetaminophen (NORCO/VICODIN) 5-325 MG per tablet 1 tablet  1 tablet Oral Q6H PRN Zada Finders, MD   1 tablet at 12/20/16 0711  . [MAR Hold] naproxen (NAPROSYN) tablet 500 mg  500 mg Oral BID PRN Zada Finders, MD      . Doug Sou Hold] ondansetron (ZOFRAN) tablet 4 mg  4 mg Oral Q6H PRN Zada Finders, MD       Or  . Doug Sou Hold] ondansetron (ZOFRAN) injection 4 mg  4 mg Intravenous Q6H PRN Zada Finders, MD      . Doug Sou Hold] sodium chloride flush (NS) 0.9 % injection 3 mL  3 mL Intravenous Q12H Zada Finders, MD   3 mL at 12/20/16 1013  . talc (STERITALC) sterile powder 4 g  4 g Intrapleural To OR Grace Isaac, MD        REVIEW OF SYSTEMS:    10 Point review of Systems was done is negative except as noted above.  PHYSICAL EXAMINATION: ECOG PERFORMANCE STATUS: 2 - Symptomatic, <50% confined to bed  . Vitals:   12/19/16 2031 12/20/16 0452  BP: 140/87 134/85  Pulse: 89 90  Resp: (!) 28 18  Temp: 99.8 F (37.7 C) 98.2 F (36.8 C)   Filed Weights   12/18/16 2026 12/19/16 0500  Weight: 139 lb 14.4 oz (63.5 kg) 139 lb 11.2 oz (63.4 kg)   .Body mass index is 20.63 kg/m.  GENERAL:alert, in  no acute distress and comfortable SKIN: skin color, texture, turgor are normal, no rashes or significant lesions EYES: normal, conjunctiva are pink and non-injected, sclera clear OROPHARYNX:no exudate, no erythema and lips, buccal mucosa, and tongue normal  NECK: supple, no JVD, thyroid normal size, non-tender, without nodularity LYMPH:  no palpable lymphadenopathy in the cervical, axillary or inguinal LUNGS:Decreased air entry left lung  HEART: regular rate & rhythm,  no murmurs and no lower extremity edema ABDOMEN: abdomen soft, non-tender, normoactive bowel sounds , No palpable hepatosplenomegaly  Musculoskeletal: no cyanosis of digits and no clubbing  PSYCH: alert &  oriented x 3 with fluent speech NEURO: no focal motor/sensory deficits  LABORATORY DATA:  I have reviewed the data as listed  . CBC Latest Ref Rng & Units 12/19/2016 12/19/2016 12/18/2016  WBC 4.0 - 10.5 K/uL 6.6 7.0 7.1  Hemoglobin 13.0 - 17.0 g/dL 11.1(L) 10.5(L) 11.4(L)  Hematocrit 39.0 - 52.0 % 33.2(L) 32.3(L) 33.9(L)  Platelets 150 - 400 K/uL 354 320 362    . CMP Latest Ref Rng & Units 12/19/2016 12/19/2016 12/18/2016  Glucose 65 - 99 mg/dL 128(H) 82 100(H)  BUN 6 - 20 mg/dL '15 10 8  '$ Creatinine 0.61 - 1.24 mg/dL 0.81 0.72 0.74  Sodium 135 - 145 mmol/L 136 134(L) 135  Potassium 3.5 - 5.1 mmol/L 4.0 4.3 4.9  Chloride 101 - 111 mmol/L 101 101 99(L)  CO2 22 - 32 mmol/L '25 23 24  '$ Calcium 8.9 - 10.3 mg/dL 9.8 9.7 10.0  Total Protein 6.5 - 8.1 g/dL 8.5(H) 8.5(H) -  Total Bilirubin 0.3 - 1.2 mg/dL 0.8 0.9 -  Alkaline Phos 38 - 126 U/L 67 61 -  AST 15 - 41 U/L 18 19 -  ALT 17 - 63 U/L 10(L) 10(L) -     RADIOGRAPHIC STUDIES: I have personally reviewed the radiological images as listed and agreed with the findings in the report. Dg Chest 1 View  Result Date: 12/19/2016 CLINICAL DATA:  Status post left-sided thoracentesis. Known left-sided superior mediastinal mass. EXAM: CHEST 1 VIEW COMPARISON:  Chest x-ray of  December 18 2016 and chest CT scan of December 18, 2016. FINDINGS: The volume of pleural fluid on the left has decreased somewhat. No postprocedure pneumothorax is observed. The right lung is well-expanded and clear. The right heart border is normal. The left heart border is obscured as are the aortic arch and left hilar structures. Masslike density above the aortic arch is again demonstrated. The observed bony thorax exhibits no acute abnormality. IMPRESSION: No postprocedure pneumothorax follow-up following left-sided thoracentesis. There is decreased pleural effusion on the left. Electronically Signed   By: David  Martinique M.D.   On: 12/19/2016 09:56   Dg Chest 1 View  Result Date: 12/11/2016 CLINICAL DATA:  66 year old who underwent left thoracentesis earlier this afternoon with removal of 1.3 L of fluid. The thoracentesis was discontinued due to chest pain. EXAM: CHEST 1 VIEW 3:02 p.m.: COMPARISON:  Two-view chest x-ray earlier today 9:24 a.m. 02/23/2011. FINDINGS: Interval decrease in size of the still large left pleural effusion. No pneumothorax. Slight improved aeration in the left upper lobe, though dense passive atelectasis and/or pneumonia persists in the left lower lobe and lingula. Right lung remains clear. IMPRESSION: 1. No pneumothorax after left thoracentesis. 2. Interval decrease in size of the still large left pleural effusion since earlier today. 3. Slight improved aeration in the left upper lobe, though dense passive atelectasis and/or pneumonia persists in the left lower lobe and lingula. Electronically Signed   By: Evangeline Dakin M.D.   On: 12/11/2016 15:35   Dg Chest 2 View  Result Date: 12/18/2016 CLINICAL DATA:  Shortness of breath and cough.  Chest pain. EXAM: CHEST  2 VIEW COMPARISON:  Seven days prior FINDINGS: Persistently enlarged left pleural effusion, reaching the apex. Masslike appearance over the medial left apex, superimposed on the aortic knob. Mild or atelectasis at the  right base. Stable non obscured heart size. IMPRESSION: Large left pleural effusion with concerning masslike appearance at the left apex. Recommend chest CT with contrast. Electronically Signed   By: Monte Fantasia  M.D.   On: 12/18/2016 14:14   Dg Chest 2 View  Result Date: 12/11/2016 CLINICAL DATA:  Hemoptysis EXAM: CHEST  2 VIEW COMPARISON:  02/23/2011 chest radiograph. FINDINGS: Stable cardiomediastinal silhouette with normal heart size. No pneumothorax. No right pleural effusion. Large left pleural effusion with mild right mediastinal deviation. Near complete opacification of the left lung, with mild residual aeration in the apical left upper lobe. No consolidative airspace disease in the right lung. No pulmonary edema. IMPRESSION: Large left pleural effusion with near complete left lung opacification, with mild residual aeration in the apical left upper lobe. Left lung opacification could be due to atelectasis, mass and/or pneumonia. Follow-up post thoracentesis chest imaging advised. Electronically Signed   By: Ilona Sorrel M.D.   On: 12/11/2016 09:32   Ct Chest W Contrast  Result Date: 12/18/2016 CLINICAL DATA:  Chronic upper abdominal pain under the ribs for 6 weeks. Shortness of breath. Generalized chest and back pain, with difficulty breathing. Initial encounter. EXAM: CT CHEST, ABDOMEN, AND PELVIS WITH CONTRAST TECHNIQUE: Multidetector CT imaging of the chest, abdomen and pelvis was performed following the standard protocol during bolus administration of intravenous contrast. CONTRAST:  100 mL ISOVUE-300 IOPAMIDOL (ISOVUE-300) INJECTION 61% COMPARISON:  Chest radiograph performed earlier today at 2:04 p.m. FINDINGS: CT CHEST FINDINGS Cardiovascular: The heart remains normal in size. The ascending thoracic aorta is borderline normal in caliber. Minimal calcification is seen along the aortic arch and descending thoracic aorta. The great vessels are grossly unremarkable in appearance.  Mediastinum/Nodes: There is diffusely infiltrating mass along the left side of the mediastinum, with associated enlarged nodes measuring up to 1.7 cm at the aortopulmonary window. The mass measures 3.6 cm adjacent to the superior mediastinum, with diffuse pleural extension of disease noted about much of the left hemithorax. No pericardial effusion is identified. The visualized portions of the thyroid gland are unremarkable. No axillary lymphadenopathy is appreciated. Lungs/Pleura: A very large left-sided pleural effusion occupies most of the left hemithorax. The expanded portions of the left lung are grossly clear. There is dense consolidation of much of the left lung; underlying mass cannot be excluded. Diffuse pleural thickening, most prominent along the mediastinum and along the posterior and inferior aspects of the hemithorax, reflects the patient's lung malignancy. A 2.1 cm nodular opacity at the right lung base is nonspecific but could reflect metastatic disease. Scattered blebs are noted at the lung apices. No pneumothorax is seen. Scattered nodules are noted about the left epicardial fat pad, reflecting metastatic disease. Musculoskeletal: No acute osseous abnormalities are identified. The visualized musculature is unremarkable in appearance. CT ABDOMEN PELVIS FINDINGS Hepatobiliary: The liver is unremarkable in appearance. The gallbladder is unremarkable in appearance. The common bile duct remains normal in caliber. Pancreas: The pancreas is within normal limits. Spleen: The spleen is unremarkable in appearance. Adrenals/Urinary Tract: The adrenal glands are unremarkable in appearance. The kidneys are within normal limits. There is no evidence of hydronephrosis. No renal or ureteral stones are identified. No perinephric stranding is seen. Stomach/Bowel: The stomach is unremarkable in appearance. The small bowel is within normal limits. The patient is status post appendectomy. The colon is unremarkable in  appearance. Vascular/Lymphatic: There is ectasia of the distal abdominal aorta to 2.7 cm in AP dimension, without evidence of aneurysmal dilatation. Scattered calcification is seen along the abdominal aorta and its branches. Mildly prominent retroperitoneal nodes, measuring up to 8 mm in short axis, are nonspecific but may reflect metastatic disease. No pelvic sidewall lymphadenopathy is seen. Reproductive:  The bladder is moderately distended and grossly unremarkable. The prostate remains normal in size. Other: No additional soft tissue abnormalities are seen. Musculoskeletal: No acute osseous abnormalities are identified. The visualized musculature is unremarkable in appearance. IMPRESSION: 1. Very large left-sided pleural effusion occupies most of the left hemithorax, with dense consolidation of much of the left lung. Underlying mass cannot be excluded. 2. Diffusely infiltrating mass along the left side of the mediastinum, measuring 3.6 cm adjacent to the superior mediastinum, and extending about much of the pleura of the left hemithorax, particularly medially, inferiorly and posteriorly. 3. Underlying mediastinal lymphadenopathy measures up to 1.7 cm, compatible with metastatic disease. 4. 2.1 cm nodular opacity at the right lung base is nonspecific but could reflect metastatic disease. 5. Scattered nodules about the left epicardial fat pad, reflecting metastatic disease. 6. Scattered blebs at the lung apices. 7. Mildly prominent retroperitoneal nodes, measuring up to 8 mm in short axis, are nonspecific but could reflect metastatic disease. 8. Scattered aortic atherosclerosis. Ectatic abdominal aorta at risk for aneurysm development. Recommend followup by ultrasound in 5 years. This recommendation follows ACR consensus guidelines: White Paper of the ACR Incidental Findings Committee II on Vascular Findings. J Am Coll Radiol 2013; 10:789-794. Electronically Signed   By: Garald Balding M.D.   On: 12/18/2016 18:28     Mr Jeri Cos ZO Contrast  Result Date: 12/18/2016 CLINICAL DATA:  Lung cancer.  Evaluation for metastatic disease. EXAM: MRI HEAD WITHOUT AND WITH CONTRAST TECHNIQUE: Multiplanar, multiecho pulse sequences of the brain and surrounding structures were obtained without and with intravenous contrast. CONTRAST:  40m MULTIHANCE GADOBENATE DIMEGLUMINE 529 MG/ML IV SOLN COMPARISON:  Brain MRI 11/14/2005 FINDINGS: Brain: No focal diffusion restriction to indicate acute infarct. No intraparenchymal hemorrhage. There is a punctate old infarct in the left cerebellum and a small, old lacunar infarct of the right basal ganglia. There is mild multifocal hyperintense T2-weighted signal within the periventricular white matter, most often seen in the setting of chronic microvascular ischemia. No mass lesion or midline shift. No hydrocephalus or extra-axial fluid collection. The midline structures are normal. No age advanced or lobar predominant atrophy. No contrast-enhancing lesions. Vascular: Major intracranial arterial and venous sinus flow voids are preserved. No evidence of chronic microhemorrhage or amyloid angiopathy. Skull and upper cervical spine: The visualized skull base, calvarium, upper cervical spine and extracranial soft tissues are normal. Sinuses/Orbits: No fluid levels or advanced mucosal thickening. No mastoid effusion. Normal orbits. IMPRESSION: 1. No intracranial metastatic disease. 2. 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 Electronically Signed   By: KUlyses JarredM.D.   On: 12/18/2016 22:07   Ct Abdomen Pelvis W Contrast  Result Date: 12/18/2016 CLINICAL DATA:  Chronic upper abdominal pain under the ribs for 6 weeks. Shortness of breath. Generalized chest and back pain, with difficulty breathing. Initial encounter. EXAM: CT CHEST, ABDOMEN, AND PELVIS WITH CONTRAST TECHNIQUE: Multidetector CT imaging of the chest, abdomen  and pelvis was performed following the standard protocol during bolus administration of intravenous contrast. CONTRAST:  100 mL ISOVUE-300 IOPAMIDOL (ISOVUE-300) INJECTION 61% COMPARISON:  Chest radiograph performed earlier today at 2:04 p.m. FINDINGS: CT CHEST FINDINGS Cardiovascular: The heart remains normal in size. The ascending thoracic aorta is borderline normal in caliber. Minimal calcification is seen along the aortic arch and descending thoracic aorta. The great vessels are grossly unremarkable in appearance. Mediastinum/Nodes: There is diffusely infiltrating mass along the left side of the mediastinum,  with associated enlarged nodes measuring up to 1.7 cm at the aortopulmonary window. The mass measures 3.6 cm adjacent to the superior mediastinum, with diffuse pleural extension of disease noted about much of the left hemithorax. No pericardial effusion is identified. The visualized portions of the thyroid gland are unremarkable. No axillary lymphadenopathy is appreciated. Lungs/Pleura: A very large left-sided pleural effusion occupies most of the left hemithorax. The expanded portions of the left lung are grossly clear. There is dense consolidation of much of the left lung; underlying mass cannot be excluded. Diffuse pleural thickening, most prominent along the mediastinum and along the posterior and inferior aspects of the hemithorax, reflects the patient's lung malignancy. A 2.1 cm nodular opacity at the right lung base is nonspecific but could reflect metastatic disease. Scattered blebs are noted at the lung apices. No pneumothorax is seen. Scattered nodules are noted about the left epicardial fat pad, reflecting metastatic disease. Musculoskeletal: No acute osseous abnormalities are identified. The visualized musculature is unremarkable in appearance. CT ABDOMEN PELVIS FINDINGS Hepatobiliary: The liver is unremarkable in appearance. The gallbladder is unremarkable in appearance. The common bile duct  remains normal in caliber. Pancreas: The pancreas is within normal limits. Spleen: The spleen is unremarkable in appearance. Adrenals/Urinary Tract: The adrenal glands are unremarkable in appearance. The kidneys are within normal limits. There is no evidence of hydronephrosis. No renal or ureteral stones are identified. No perinephric stranding is seen. Stomach/Bowel: The stomach is unremarkable in appearance. The small bowel is within normal limits. The patient is status post appendectomy. The colon is unremarkable in appearance. Vascular/Lymphatic: There is ectasia of the distal abdominal aorta to 2.7 cm in AP dimension, without evidence of aneurysmal dilatation. Scattered calcification is seen along the abdominal aorta and its branches. Mildly prominent retroperitoneal nodes, measuring up to 8 mm in short axis, are nonspecific but may reflect metastatic disease. No pelvic sidewall lymphadenopathy is seen. Reproductive: The bladder is moderately distended and grossly unremarkable. The prostate remains normal in size. Other: No additional soft tissue abnormalities are seen. Musculoskeletal: No acute osseous abnormalities are identified. The visualized musculature is unremarkable in appearance. IMPRESSION: 1. Very large left-sided pleural effusion occupies most of the left hemithorax, with dense consolidation of much of the left lung. Underlying mass cannot be excluded. 2. Diffusely infiltrating mass along the left side of the mediastinum, measuring 3.6 cm adjacent to the superior mediastinum, and extending about much of the pleura of the left hemithorax, particularly medially, inferiorly and posteriorly. 3. Underlying mediastinal lymphadenopathy measures up to 1.7 cm, compatible with metastatic disease. 4. 2.1 cm nodular opacity at the right lung base is nonspecific but could reflect metastatic disease. 5. Scattered nodules about the left epicardial fat pad, reflecting metastatic disease. 6. Scattered blebs at the  lung apices. 7. Mildly prominent retroperitoneal nodes, measuring up to 8 mm in short axis, are nonspecific but could reflect metastatic disease. 8. Scattered aortic atherosclerosis. Ectatic abdominal aorta at risk for aneurysm development. Recommend followup by ultrasound in 5 years. This recommendation follows ACR consensus guidelines: White Paper of the ACR Incidental Findings Committee II on Vascular Findings. J Am Coll Radiol 2013; 10:789-794. Electronically Signed   By: Garald Balding M.D.   On: 12/18/2016 18:28   US Thoracentesis Asp Pleural Space W/img Guide  Result Date: 12/19/2016 INDICATION: Left-sided chest mass. Recurrent large left pleural effusion. Request diagnostic and therapeutic thoracentesis. EXAM: ULTRASOUND GUIDED LEFT THORACENTESIS MEDICATIONS: None. COMPLICATIONS: None immediate. Postprocedural chest x-ray negative for pneumothorax. PROCEDURE: An ultrasound guided  thoracentesis was thoroughly discussed with the patient and questions answered. The benefits, risks, alternatives and complications were also discussed. The patient understands and wishes to proceed with the procedure. Written consent was obtained. Ultrasound was performed to localize and mark an adequate pocket of fluid in the left chest. The area was then prepped and draped in the normal sterile fashion. 1% Lidocaine was used for local anesthesia. Under ultrasound guidance a Safe-T-Centesis catheter was introduced. Thoracentesis was performed. The catheter was removed and a dressing applied. FINDINGS: A total of approximately 1.1 L of hazy, blood-tinged fluid was removed. Patient developed significant discomfort and cough at this point. The procedure was terminated. Samples were sent to the laboratory as requested by the clinical team. IMPRESSION: Successful ultrasound guided left thoracentesis yielding 1.1 L of pleural fluid. Read by: Ascencion Dike PA-C Electronically Signed   By: Marybelle Killings M.D.   On: 12/19/2016 09:58    US Thoracentesis Asp Pleural Space W/img Guide  Result Date: 12/11/2016 INDICATION: Hemoptysis and shortness of breath for 10 weeks. New findings of left-sided pleural effusion. Request is made for diagnostic and therapeutic thoracentesis. EXAM: ULTRASOUND GUIDED DIAGNOSTIC AND THERAPEUTIC THORACENTESIS MEDICATIONS: 1% lidocaine COMPLICATIONS: None immediate. PROCEDURE: An ultrasound guided thoracentesis was thoroughly discussed with the patient and questions answered. The benefits, risks, alternatives and complications were also discussed. The patient understands and wishes to proceed with the procedure. Written consent was obtained. Ultrasound was performed to localize and mark an adequate pocket of fluid in the left chest. The area was then prepped and draped in the normal sterile fashion. 1% Lidocaine was used for local anesthesia. Under ultrasound guidance a Saf-T-Centesis catheter was introduced. Thoracentesis was performed. The catheter was removed and a dressing applied. FINDINGS: A total of approximately 1.3 L of serosanguineous fluid was removed. Samples were sent to the laboratory as requested by the clinical team. IMPRESSION: Successful ultrasound guided left thoracentesis yielding 1.3 L of pleural fluid. The procedure was terminated at this amount secondary to coughing and chest pain. The patient will be arranged to return as an outpatient for a follow up thoracentesis and CT of the chest. This was discussed with Dr. Alvino Chapel, ED MD. Read by: Saverio Danker, PA-C Electronically Signed   By: Sandi Mariscal M.D.   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 Previous cytology showed atypical cells concerning for malignancy. Currently patient has a mass in the underlying left lung and a possible right lower lobe lung nodule. No overt evidence of metastatic  disease outside of the chest cavity or in the brain. Plan -Over all findings certainly concerning for metastatic lung cancer with malignant pleural effusion. -Appreciate pulmonary and cardiothoracic surgery input with regards to management of his recurrent malignant left-sided pleural effusion and to try to obtain tissue for definitive diagnosis. -Agree with VATS procedure. Patient will adequate biopsy of his tumor to allow for molecular mutation and PDL 1 testing. -Might need indwelling catheter for drainage of his pleural effusion. -If this turns out to be small cell lung cancer might need to get his first cycle of chemotherapy while inpatient. -We'll continue to follow pending definitive tissue diagnosis. -Cytology from his last therapeutic thoracentesis currently pending.  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 -Dietitian input. -Consider low-dose dexamethasone 2 mg by mouth daily for a couple weeks.  Oncology we'll continue to follow and more specific treatment recommendations after tissue diagnosis.   l of the patients questions were answered with apparent satisfaction. The patient knows to call the clinic with any problems, questions or concerns.  I spent 60 minutes counseling the patient face to face. The total time spent in the appointment was 80 minutes and more than 50% was on counseling and direct patient cares.    Sullivan Lone MD Salome AAHIVMS [facility] Hematology/Oncology Physician [facility]  (Office):       [phone] (Work cell):  [phone] (Fax):           [phone]  12/19/2016 8:31 PM

## 2016-12-19 NOTE — Progress Notes (Signed)
Initial Nutrition Assessment  DOCUMENTATION CODES:   Severe malnutrition in context of chronic illness  INTERVENTION:    Ensure Enlive PO TID, each supplement provides 350 kcal and 20 grams of protein  NUTRITION DIAGNOSIS:   Malnutrition related to chronic illness as evidenced by severe depletion of muscle mass, percent weight loss (9% weight loss in 3 months).  GOAL:   Patient will meet greater than or equal to 90% of their needs  MONITOR:   PO intake, Supplement acceptance  REASON FOR ASSESSMENT:   Consult Assessment of nutrition requirement/status  ASSESSMENT:   66 year old African-American male with a past medical history of orthostatic hypotension, G6PD deficiency, right bundle branch block and a significant tobacco abuse history who presented from his doctor's office with shortness of breath and pleural effusion. Chest CT showed extremely large left sided pleural effusion, multiple areas of infiltrating masslike structures and potential metastatic disease.  Patient with 9% weight loss over the past 3 months. He reports poor appetite and early satiety since starting Hep C treatment 3 months ago.  Nutrition-Focused physical exam completed. Findings are mild-moderate fat depletion, severe muscle depletion, and no edema.  Patient with severe PCM. He agreed to drink Ensure supplements between meals to maximize oral intake. Labs reviewed: sodium 134. Medications reviewed.  Diet Order:  Diet regular Room service appropriate? Yes; Fluid consistency: Thin  Skin:  Reviewed, no issues  Last BM:  2/19  Height:   Ht Readings from Last 1 Encounters:  12/18/16 '5\' 9"'$  (1.753 m)    Weight:   Wt Readings from Last 1 Encounters:  12/19/16 139 lb 11.2 oz (63.4 kg)    Ideal Body Weight:  72.7 kg  BMI:  Body mass index is 20.63 kg/m.  Estimated Nutritional Needs:   Kcal:  2000-2200  Protein:  100-115 gm  Fluid:  2-2.2 L  EDUCATION NEEDS:   No education needs  identified at this time  Molli Barrows, [place], [place], [place] Pager [phone] After Hours Pager [phone]

## 2016-12-19 NOTE — Procedures (Signed)
PROCEDURE SUMMARY:  Successful US guided left thoracentesis. Yielded 1.1 L of blood tinged fluid before pt c/o significant discomfort and cough.  Procedure terminated at this point No immediate complications.  Specimen was sent for labs. CXR ordered.  Ascencion Dike PA-C 12/19/2016 9:44 AM

## 2016-12-19 NOTE — Consult Note (Signed)
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 reports that he just completed hepatitis C treatment of her unknown pill several weeks ago. Simultaneously around the time started noticing insidious onset of shortness of breath. He says at the initiation of hepatitis C treatment he believes a chest x-ray was clear. However after noticing shortness of breath from 12/11/2016 was noticed to have large left pleural effusion. Underwent thoracentesis diagnostic that shows atypical cells with bloody effusion with suspicion for malignancy [results visualized personally]. He then started feeling better but he's had recurrent effusion and underwent a second thoracentesis again today 12/19/2016 and he is beginning to feel better but he is worried that his effusion will come back. He is has for several weeks to a few months ongoing me  B symptoms, night sweats, weight loss, fatigue and exertional dyspnea that is improved by thoracentesis. The pleural effusion shows this and exudate  . He underwent CT chest at admission yesterday 12/18/2016 that shows large left-sided pleural effusion associated with significant pleural thickening suspicious of malignancy. There is only one mediastinal node. I personally visualized the CT scan and agree with the report.  PAST MEDICAL HISTORY :  He  has a past medical history of G6PD deficiency ([place]); H/O orthostatic hypotension; Pneumonia; RBBB (right bundle branch block); and Seizures ([place]).  PAST SURGICAL HISTORY: He  has a past surgical history that includes Appendectomy and US ECHOCARDIOGRAPHY (05/12/2008).  Allergies  Allergen Reactions  . Tuberculin  Purified Protein Derivative Swelling    abscess   . Sulfa Antibiotics     No current facility-administered medications on file prior to encounter.    Current Outpatient Prescriptions on File Prior to Encounter  Medication Sig  . Elbasvir-Grazoprevir (ZEPATIER) 50-100 MG TABS Take 1 tablet by mouth daily.  [name] ibuprofen (ADVIL,MOTRIN) 200 MG tablet Take 200 mg by mouth every 4 (four) hours as needed for mild pain.  [name] HYDROcodone-acetaminophen (NORCO/VICODIN) 5-325 MG per tablet Take 1 tablet by mouth every 6 (six) hours as needed for moderate pain. (Patient not taking: Reported on 10/17/2016)  . ibuprofen (ADVIL,MOTRIN) 800 MG tablet Take 1 tablet (800 mg total) by mouth every 8 (eight) hours as needed. (Patient not taking: Reported on 12/11/2016)  . naproxen (NAPROSYN) 500 MG tablet Take 1 tablet (500 mg total) by mouth 2 (two) times daily with a meal. (Patient not taking: Reported on 10/17/2016)    FAMILY HISTORY:  His indicated that his mother is deceased. He indicated that his father is deceased.    SOCIAL HISTORY: He  reports that he has quit smoking. His smoking use included Cigars. He has a 60.00 pack-year smoking history. He has never used smokeless tobacco. He reports that he does not drink alcohol or use drugs.  REVIEW OF SYSTEMS:   As per history of present illness. Otherwise all systems reviewed and negative  VITAL SIGNS: BP (!) 145/77   Pulse 77   Temp 99.2 F (37.3 C) (Oral)   Resp (!) 23   Ht '5\' 9"'$  (1.753 m)   Wt 63.4 kg (139 lb 11.2 oz)   SpO2 97%   BMI 20.63 kg/m   HEMODYNAMICS:    VENTILATOR SETTINGS:  INTAKE / OUTPUT: I/O last 3 completed shifts: In: 52 [P.O.:90] Out: 800 [Urine:800]  PHYSICAL EXAMINATION: General:  Frail male. Looks comfortable  Neurologic: Alert and oriented 3. Speech normal  HEENT:  No neck nodes. No elevated JVP. Pupils equal and reactive to light Cardiovascular:  Normal heart sounds regular rate and rhythm Lungs:  Clear to  auscultation bilaterally. Left-sided air entry slightly diminished but improved and better than expected Abdomen:Soft nontender. No organomegaly i  Musculoskeletal: No cyanosis. No clubbing no edema  Skin: Appears intact in the exposed areas   LABS:  BMET  Recent Labs Lab 12/18/16 1320 12/19/16 0506  NA 135 134*  K 4.9 4.3  CL 99* 101  CO2 24 23  BUN 8 10  CREATININE 0.74 0.72  GLUCOSE 100* 82    Electrolytes  Recent Labs Lab 12/18/16 1320 12/19/16 0506  CALCIUM 10.0 9.7    CBC  Recent Labs Lab 12/18/16 1320 12/19/16 0506  WBC 7.1 7.0  HGB 11.4* 10.5*  HCT 33.9* 32.3*  PLT 362 320    Coag's No results for input(s): APTT, INR in the last 168 hours.  Sepsis Markers No results for input(s): LATICACIDVEN, PROCALCITON, O2SATVEN in the last 168 hours.  ABG No results for input(s): PHART, PCO2ART, PO2ART in the last 168 hours.  Liver Enzymes  Recent Labs Lab 12/19/16 0506  AST 19  ALT 10*  ALKPHOS 61  BILITOT 0.9  ALBUMIN 2.9*    Cardiac Enzymes No results for input(s): TROPONINI, PROBNP in the last 168 hours.  Glucose No results for input(s): GLUCAP in the last 168 hours.  Imaging Dg Chest 1 View  Result Date: 12/19/2016 CLINICAL DATA:  Status post left-sided thoracentesis. Known left-sided superior mediastinal mass. EXAM: CHEST 1 VIEW COMPARISON:  Chest x-ray of December 18 2016 and chest CT scan of December 18, 2016. FINDINGS: The volume of pleural fluid on the left has decreased somewhat. No postprocedure pneumothorax is observed. The right lung is well-expanded and clear. The right heart border is normal. The left heart border is obscured as are the aortic arch and left hilar structures. Masslike density above the aortic arch is again demonstrated. The observed bony thorax exhibits no acute abnormality. IMPRESSION: No postprocedure pneumothorax follow-up following left-sided thoracentesis. There is decreased pleural effusion on the left.  Electronically Signed   By: David  Martinique M.D.   On: 12/19/2016 09:56   Dg Chest 2 View  Result Date: 12/18/2016 CLINICAL DATA:  Shortness of breath and cough.  Chest pain. EXAM: CHEST  2 VIEW COMPARISON:  Seven days prior FINDINGS: Persistently enlarged left pleural effusion, reaching the apex. Masslike appearance over the medial left apex, superimposed on the aortic knob. Mild or atelectasis at the right base. Stable non obscured heart size. IMPRESSION: Large left pleural effusion with concerning masslike appearance at the left apex. Recommend chest CT with contrast. Electronically Signed   By: Monte Fantasia M.D.   On: 12/18/2016 14:14   Ct Chest W Contrast  Result Date: 12/18/2016 CLINICAL DATA:  Chronic upper abdominal pain under the ribs for 6 weeks. Shortness of breath. Generalized chest and back pain, with difficulty breathing. Initial encounter. EXAM: CT CHEST, ABDOMEN, AND PELVIS WITH CONTRAST TECHNIQUE: Multidetector CT imaging of the chest, abdomen and pelvis was performed following the standard protocol during bolus administration of intravenous contrast. CONTRAST:  100 mL ISOVUE-300 IOPAMIDOL (ISOVUE-300) INJECTION 61% COMPARISON:  Chest radiograph performed earlier today at 2:04 p.m. FINDINGS: CT CHEST FINDINGS Cardiovascular: The heart remains normal  in size. The ascending thoracic aorta is borderline normal in caliber. Minimal calcification is seen along the aortic arch and descending thoracic aorta. The great vessels are grossly unremarkable in appearance. Mediastinum/Nodes: There is diffusely infiltrating mass along the left side of the mediastinum, with associated enlarged nodes measuring up to 1.7 cm at the aortopulmonary window. The mass measures 3.6 cm adjacent to the superior mediastinum, with diffuse pleural extension of disease noted about much of the left hemithorax. No pericardial effusion is identified. The visualized portions of the thyroid gland are unremarkable. No axillary  lymphadenopathy is appreciated. Lungs/Pleura: A very large left-sided pleural effusion occupies most of the left hemithorax. The expanded portions of the left lung are grossly clear. There is dense consolidation of much of the left lung; underlying mass cannot be excluded. Diffuse pleural thickening, most prominent along the mediastinum and along the posterior and inferior aspects of the hemithorax, reflects the patient's lung malignancy. A 2.1 cm nodular opacity at the right lung base is nonspecific but could reflect metastatic disease. Scattered blebs are noted at the lung apices. No pneumothorax is seen. Scattered nodules are noted about the left epicardial fat pad, reflecting metastatic disease. Musculoskeletal: No acute osseous abnormalities are identified. The visualized musculature is unremarkable in appearance. CT ABDOMEN PELVIS FINDINGS Hepatobiliary: The liver is unremarkable in appearance. The gallbladder is unremarkable in appearance. The common bile duct remains normal in caliber. Pancreas: The pancreas is within normal limits. Spleen: The spleen is unremarkable in appearance. Adrenals/Urinary Tract: The adrenal glands are unremarkable in appearance. The kidneys are within normal limits. There is no evidence of hydronephrosis. No renal or ureteral stones are identified. No perinephric stranding is seen. Stomach/Bowel: The stomach is unremarkable in appearance. The small bowel is within normal limits. The patient is status post appendectomy. The colon is unremarkable in appearance. Vascular/Lymphatic: There is ectasia of the distal abdominal aorta to 2.7 cm in AP dimension, without evidence of aneurysmal dilatation. Scattered calcification is seen along the abdominal aorta and its branches. Mildly prominent retroperitoneal nodes, measuring up to 8 mm in short axis, are nonspecific but may reflect metastatic disease. No pelvic sidewall lymphadenopathy is seen. Reproductive: The bladder is moderately  distended and grossly unremarkable. The prostate remains normal in size. Other: No additional soft tissue abnormalities are seen. Musculoskeletal: No acute osseous abnormalities are identified. The visualized musculature is unremarkable in appearance. IMPRESSION: 1. Very large left-sided pleural effusion occupies most of the left hemithorax, with dense consolidation of much of the left lung. Underlying mass cannot be excluded. 2. Diffusely infiltrating mass along the left side of the mediastinum, measuring 3.6 cm adjacent to the superior mediastinum, and extending about much of the pleura of the left hemithorax, particularly medially, inferiorly and posteriorly. 3. Underlying mediastinal lymphadenopathy measures up to 1.7 cm, compatible with metastatic disease. 4. 2.1 cm nodular opacity at the right lung base is nonspecific but could reflect metastatic disease. 5. Scattered nodules about the left epicardial fat pad, reflecting metastatic disease. 6. Scattered blebs at the lung apices. 7. Mildly prominent retroperitoneal nodes, measuring up to 8 mm in short axis, are nonspecific but could reflect metastatic disease. 8. Scattered aortic atherosclerosis. Ectatic abdominal aorta at risk for aneurysm development. Recommend followup by ultrasound in 5 years. This recommendation follows ACR consensus guidelines: White Paper of the ACR Incidental Findings Committee II on Vascular Findings. J Am Coll Radiol 2013; 10:789-794. Electronically Signed   By: Garald Balding M.D.   On: 12/18/2016 18:28   Mr  Brain W Wo Contrast  Result Date: 12/18/2016 CLINICAL DATA:  Lung cancer.  Evaluation for metastatic disease. EXAM: MRI HEAD WITHOUT AND WITH CONTRAST TECHNIQUE: Multiplanar, multiecho pulse sequences of the brain and surrounding structures were obtained without and with intravenous contrast. CONTRAST:  66m MULTIHANCE GADOBENATE DIMEGLUMINE 529 MG/ML IV SOLN COMPARISON:  Brain MRI 11/14/2005 FINDINGS: Brain: No focal  diffusion restriction to indicate acute infarct. No intraparenchymal hemorrhage. There is a punctate old infarct in the left cerebellum and a small, old lacunar infarct of the right basal ganglia. There is mild multifocal hyperintense T2-weighted signal within the periventricular white matter, most often seen in the setting of chronic microvascular ischemia. No mass lesion or midline shift. No hydrocephalus or extra-axial fluid collection. The midline structures are normal. No age advanced or lobar predominant atrophy. No contrast-enhancing lesions. Vascular: Major intracranial arterial and venous sinus flow voids are preserved. No evidence of chronic microhemorrhage or amyloid angiopathy. Skull and upper cervical spine: The visualized skull base, calvarium, upper cervical spine and extracranial soft tissues are normal. Sinuses/Orbits: No fluid levels or advanced mucosal thickening. No mastoid effusion. Normal orbits. IMPRESSION: 1. No intracranial metastatic disease. 2. 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 Electronically Signed   By: KUlyses JarredM.D.   On: 12/18/2016 22:07   Ct Abdomen Pelvis W Contrast  Result Date: 12/18/2016 CLINICAL DATA:  Chronic upper abdominal pain under the ribs for 6 weeks. Shortness of breath. Generalized chest and back pain, with difficulty breathing. Initial encounter. EXAM: CT CHEST, ABDOMEN, AND PELVIS WITH CONTRAST TECHNIQUE: Multidetector CT imaging of the chest, abdomen and pelvis was performed following the standard protocol during bolus administration of intravenous contrast. CONTRAST:  100 mL ISOVUE-300 IOPAMIDOL (ISOVUE-300) INJECTION 61% COMPARISON:  Chest radiograph performed earlier today at 2:04 p.m. FINDINGS: CT CHEST FINDINGS Cardiovascular: The heart remains normal in size. The ascending thoracic aorta is borderline normal in caliber. Minimal calcification is seen along the aortic  arch and descending thoracic aorta. The great vessels are grossly unremarkable in appearance. Mediastinum/Nodes: There is diffusely infiltrating mass along the left side of the mediastinum, with associated enlarged nodes measuring up to 1.7 cm at the aortopulmonary window. The mass measures 3.6 cm adjacent to the superior mediastinum, with diffuse pleural extension of disease noted about much of the left hemithorax. No pericardial effusion is identified. The visualized portions of the thyroid gland are unremarkable. No axillary lymphadenopathy is appreciated. Lungs/Pleura: A very large left-sided pleural effusion occupies most of the left hemithorax. The expanded portions of the left lung are grossly clear. There is dense consolidation of much of the left lung; underlying mass cannot be excluded. Diffuse pleural thickening, most prominent along the mediastinum and along the posterior and inferior aspects of the hemithorax, reflects the patient's lung malignancy. A 2.1 cm nodular opacity at the right lung base is nonspecific but could reflect metastatic disease. Scattered blebs are noted at the lung apices. No pneumothorax is seen. Scattered nodules are noted about the left epicardial fat pad, reflecting metastatic disease. Musculoskeletal: No acute osseous abnormalities are identified. The visualized musculature is unremarkable in appearance. CT ABDOMEN PELVIS FINDINGS Hepatobiliary: The liver is unremarkable in appearance. The gallbladder is unremarkable in appearance. The common bile duct remains normal in caliber. Pancreas: The pancreas is within normal limits. Spleen: The spleen is unremarkable in appearance. Adrenals/Urinary Tract: The adrenal glands are unremarkable in appearance. The kidneys are within normal limits. There is  no evidence of hydronephrosis. No renal or ureteral stones are identified. No perinephric stranding is seen. Stomach/Bowel: The stomach is unremarkable in appearance. The small bowel is  within normal limits. The patient is status post appendectomy. The colon is unremarkable in appearance. Vascular/Lymphatic: There is ectasia of the distal abdominal aorta to 2.7 cm in AP dimension, without evidence of aneurysmal dilatation. Scattered calcification is seen along the abdominal aorta and its branches. Mildly prominent retroperitoneal nodes, measuring up to 8 mm in short axis, are nonspecific but may reflect metastatic disease. No pelvic sidewall lymphadenopathy is seen. Reproductive: The bladder is moderately distended and grossly unremarkable. The prostate remains normal in size. Other: No additional soft tissue abnormalities are seen. Musculoskeletal: No acute osseous abnormalities are identified. The visualized musculature is unremarkable in appearance. IMPRESSION: 1. Very large left-sided pleural effusion occupies most of the left hemithorax, with dense consolidation of much of the left lung. Underlying mass cannot be excluded. 2. Diffusely infiltrating mass along the left side of the mediastinum, measuring 3.6 cm adjacent to the superior mediastinum, and extending about much of the pleura of the left hemithorax, particularly medially, inferiorly and posteriorly. 3. Underlying mediastinal lymphadenopathy measures up to 1.7 cm, compatible with metastatic disease. 4. 2.1 cm nodular opacity at the right lung base is nonspecific but could reflect metastatic disease. 5. Scattered nodules about the left epicardial fat pad, reflecting metastatic disease. 6. Scattered blebs at the lung apices. 7. Mildly prominent retroperitoneal nodes, measuring up to 8 mm in short axis, are nonspecific but could reflect metastatic disease. 8. Scattered aortic atherosclerosis. Ectatic abdominal aorta at risk for aneurysm development. Recommend followup by ultrasound in 5 years. This recommendation follows ACR consensus guidelines: White Paper of the ACR Incidental Findings Committee II on Vascular Findings. J Am Coll  Radiol 2013; 10:789-794. Electronically Signed   By: Garald Balding M.D.   On: 12/18/2016 18:28   US Thoracentesis Asp Pleural Space W/img Guide  Result Date: 12/19/2016 INDICATION: Left-sided chest mass. Recurrent large left pleural effusion. Request diagnostic and therapeutic thoracentesis. EXAM: ULTRASOUND GUIDED LEFT THORACENTESIS MEDICATIONS: None. COMPLICATIONS: None immediate. Postprocedural chest x-ray negative for pneumothorax. PROCEDURE: An ultrasound guided thoracentesis was thoroughly discussed with the patient and questions answered. The benefits, risks, alternatives and complications were also discussed. The patient understands and wishes to proceed with the procedure. Written consent was obtained. Ultrasound was performed to localize and mark an adequate pocket of fluid in the left chest. The area was then prepped and draped in the normal sterile fashion. 1% Lidocaine was used for local anesthesia. Under ultrasound guidance a Safe-T-Centesis catheter was introduced. Thoracentesis was performed. The catheter was removed and a dressing applied. FINDINGS: A total of approximately 1.1 L of hazy, blood-tinged fluid was removed. Patient developed significant discomfort and cough at this point. The procedure was terminated. Samples were sent to the laboratory as requested by the clinical team. IMPRESSION: Successful ultrasound guided left thoracentesis yielding 1.1 L of pleural fluid. Read by: Ascencion Dike PA-C Electronically Signed   By: Marybelle Killings M.D.   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  High pretest probability for  mesothelioma and secondarily lung cancer  P:   Probably best served by VATS pleural biopsy as opposed to bronchoscopy with endobronchial ultrasound biopsy post VATS pleural biopsy consideration for pleurodesis versus Pleurx catheter  I called Ryan at thoracic oncology office and called in a surgical consultation  PCCM will be available as necessary   Dr. Brand Males, M.D., [facility].P Pulmonary and Critical Care Medicine Staff Physician [facility] Pager: [phone], If no answer or between  15:00h - 7:00h: call [phone]  12/19/2016 12:25 PM

## 2016-12-19 NOTE — Consult Note (Signed)
[address]       [place] [address]             [id number]        [name] Medical Record #[id number] Date of Birth: November 14, 1950  Referring: Dr. Ophelia Shoulder Primary Care: No PCP Per Patient  Chief Complaint:    Chief Complaint  Patient presents with  . Shortness of Breath   History of Present Illness:      66 yo AA male with history of Hypotension, G6PD deficiency, RBBB and significant smoking history of 2 ppd for at least 30 years.  He presented to th ED a week ago with complaints of shortness of breath.  At that time he was found to have a large pleural effusion.  He underwent thoracentesis which removed >1L of fluid and cytology identified atypical cells concerning for malignancy.  He followed up with his PCP on 2/19 as scheduled at which point he was again short of breath.  He was referred to the ED for admission.  Patient admits to being short of breath for the past 3 weeks.  This has progressed over the past 4-5 days.  He also admits to drenching night sweats, fatigue and weight loss of about 20 lbs over the past 3 months.  CT scan was obtained on presentation to the ED which showed a large left sided pleural effusion.  There was also mass like appearance of the left apex.  There were also multiple areas of infiltrating structures concerning for metastatic disease.  Thoracentesis was again repeated today with relief of symptoms.  Cardiothoracic surgery consult has been requested for VATS with pleural biopsy.  Patient currently has no new complaints.  He states he is feeling somewhat better.  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  He has a history of Hepatitis C also and has completed treatment a few weeks ago.  Current Activity/ Functional Status: Patient is independent with mobility/ambulation, transfers, ADL's, IADL's.   Zubrod Score: At the time of  surgery this patient's most appropriate activity status/level should be described as: '[]'$     0    Normal activity, no symptoms '[x]'$     1    Restricted in physical strenuous activity but ambulatory, able to do out light work '[]'$     2    Ambulatory and capable of self care, unable to do work activities, up and about                 more than 50%  Of the time                            '[]'$     3    Only limited self care, in bed greater than 50% of waking hours '[]'$     4    Completely disabled, no self care, confined to bed or chair '[]'$     5    Moribund  Past Medical History:  Diagnosis Date  . G6PD deficiency ([place])   . H/O orthostatic hypotension   . Pneumonia   . RBBB (right bundle branch block)   . Seizures ([place])     Past Surgical History:  Procedure Laterality Date  . APPENDECTOMY    . US ECHOCARDIOGRAPHY  05/12/2008   EF 55-60%    History  Smoking Status  .  Former Smoker  . Packs/day: 2.00  . Years: 30.00  . Types: Cigars  Smokeless Tobacco  . Never Used    Comment: Quit ~2017    History  Alcohol Use No    Comment: Former use    Social History   Social History  . Marital status: Single    Spouse name: N/A  . Number of children: N/A  . Years of education: N/A   Occupational History  . Not on file.   Social History Main Topics  . Smoking status: Former Smoker    Packs/day: 2.00    Years: 30.00    Types: Cigars  . Smokeless tobacco: Never Used     Comment: Quit ~2017  . Alcohol use No     Comment: Former use  . Drug use: No  . Sexual activity: Not on file   Other Topics Concern  . Not on file   Social History Narrative  . No narrative on file    Allergies  Allergen Reactions  . Tuberculin Purified Protein Derivative Swelling    abscess   . Sulfa Antibiotics     Current Facility-Administered Medications  Medication Dose Route Frequency Provider Last Rate Last Dose  . Elbasvir-Grazoprevir 50-100 MG TABS 1 tablet  1 tablet Oral Daily Zada Finders, MD       . enoxaparin (LOVENOX) injection 40 mg  40 mg Subcutaneous Q24H Zada Finders, MD   40 mg at 12/18/16 2307  . HYDROcodone-acetaminophen (NORCO/VICODIN) 5-325 MG per tablet 1 tablet  1 tablet Oral Q6H PRN Zada Finders, MD   1 tablet at 12/19/16 [phone]  . lidocaine (PF) (XYLOCAINE) 1 % injection           . naproxen (NAPROSYN) tablet 500 mg  500 mg Oral BID PRN Zada Finders, MD      . ondansetron (ZOFRAN) tablet 4 mg  4 mg Oral Q6H PRN Zada Finders, MD       Or  . ondansetron (ZOFRAN) injection 4 mg  4 mg Intravenous Q6H PRN Zada Finders, MD      . sodium chloride flush (NS) 0.9 % injection 3 mL  3 mL Intravenous Q12H Zada Finders, MD   3 mL at 12/19/16 1114    Prescriptions Prior to Admission  Medication Sig Dispense Refill Last Dose  . aspirin EC 81 MG tablet Take 162 mg by mouth daily.   12/18/2016 at Unknown time  . Elbasvir-Grazoprevir (ZEPATIER) 50-100 MG TABS Take 1 tablet by mouth daily. 28 tablet 2 12/18/2016 at Unknown time  . ibuprofen (ADVIL,MOTRIN) 200 MG tablet Take 200 mg by mouth every 4 (four) hours as needed for mild pain.   12/18/2016 at Unknown time  . HYDROcodone-acetaminophen (NORCO/VICODIN) 5-325 MG per tablet Take 1 tablet by mouth every 6 (six) hours as needed for moderate pain. (Patient not taking: Reported on 10/17/2016) 15 tablet 0 Not Taking at Unknown time  . ibuprofen (ADVIL,MOTRIN) 800 MG tablet Take 1 tablet (800 mg total) by mouth every 8 (eight) hours as needed. (Patient not taking: Reported on 12/11/2016) 21 tablet 0 Not Taking at Unknown time  . naproxen (NAPROSYN) 500 MG tablet Take 1 tablet (500 mg total) by mouth 2 (two) times daily with a meal. (Patient not taking: Reported on 10/17/2016) 30 tablet 0 Not Taking at Unknown time    Family History  Problem Relation Age of Onset  . Lung cancer Mother   . Brain cancer Mother   . Heart disease Father   .  Hypertension Father   . Heart attack Father    Review of Systems:  Constitutional: positive for fatigue,  night sweats and weight loss Eyes: negative Respiratory: positive for dyspnea on exertion and pleurisy/chest pain Cardiovascular: positive for fatigue Gastrointestinal: negative Musculoskeletal:negative Neurological: negative     Cardiac Review of Systems: Y or N  Chest Pain [    ]  Resting SOB [ y  ] Exertional SOB  Blue.Reese  ]  Orthopnea [  ]   Pedal Edema [   ]    Palpitations [  ] Syncope  [  ]   Presyncope [   ]  General Review of Systems: [Y] = yes [  ]=no Constitional: recent weight change [ y ]; anorexia [  ]; fatigue [  ]; nausea [  ]; night sweats [ y ]; fever [  ]; or chills [  ]                                                               Dental: poor dentition[  ]; Last Dentist visit:   Eye : blurred vision [  ]; diplopia [   ]; vision changes [  ];  Amaurosis fugax[  ]; Resp: cough [  ];  wheezing[  ];  hemoptysis[  ]; shortness of breath[ y ]; paroxysmal nocturnal dyspnea[  ]; dyspnea on exertion[  ]; or orthopnea[  ];  GI:  gallstones[  ], vomiting[  ];  dysphagia[  ]; melena[  ];  hematochezia [  ]; heartburn[  ];   Hx of  Colonoscopy[  ]; GU: kidney stones [  ]; hematuria[  ];   dysuria [  ];  nocturia[  ];  history of     obstruction [  ]; urinary frequency [  ]             Skin: rash, swelling[  ];, hair loss[  ];  peripheral edema[  ];  or itching[  ]; Musculosketetal: myalgias[  ];  joint swelling[  ];  joint erythema[  ];  joint pain[  ];  back pain[  ];  Heme/Lymph: bruising[  ];  bleeding[  ];  anemia[  ];  Neuro: TIA[  ];  headaches[  ];  stroke[  ];  vertigo[  ];  seizures[  ];   paresthesias[  ];  difficulty walking[  ];  Psych:depression[  ]; anxiety[  ];  Endocrine: diabetes[  ];  thyroid dysfunction[  ];  Immunizations: Flu [  ]; Pneumococcal[  ];  Other:  Physical Exam: BP (!) 145/77   Pulse 77   Temp 99.2 F (37.3 C) (Oral)   Resp (!) 23   Ht '5\' 9"'$  (1.753 m)   Wt 139 lb 11.2 oz (63.4 kg)   SpO2 97%   BMI 20.63 kg/m   General appearance: alert and  cooperative Head: Normocephalic, without obvious abnormality, atraumatic Lymph nodes: Cervical, supraclavicular, and axillary nodes normal. Resp: diminished breath sounds bibasilar Cardio: regular rate and rhythm GI: soft, non-tender; bowel sounds normal; no masses,  no organomegaly Extremities: extremities normal, atraumatic, no cyanosis or edema Neurologic: Grossly normal  Diagnostic Studies & Laboratory data:     Recent Radiology Findings:   Dg Chest 1 View  Result Date: 12/19/2016 CLINICAL DATA:  Status post left-sided thoracentesis. Known left-sided superior mediastinal mass. EXAM: CHEST 1 VIEW COMPARISON:  Chest x-ray of December 18 2016 and chest CT scan of December 18, 2016. FINDINGS: The volume of pleural fluid on the left has decreased somewhat. No postprocedure pneumothorax is observed. The right lung is well-expanded and clear. The right heart border is normal. The left heart border is obscured as are the aortic arch and left hilar structures. Masslike density above the aortic arch is again demonstrated. The observed bony thorax exhibits no acute abnormality. IMPRESSION: No postprocedure pneumothorax follow-up following left-sided thoracentesis. There is decreased pleural effusion on the left. Electronically Signed   By: David  Martinique M.D.   On: 12/19/2016 09:56   Dg Chest 2 View  Result Date: 12/18/2016 CLINICAL DATA:  Shortness of breath and cough.  Chest pain. EXAM: CHEST  2 VIEW COMPARISON:  Seven days prior FINDINGS: Persistently enlarged left pleural effusion, reaching the apex. Masslike appearance over the medial left apex, superimposed on the aortic knob. Mild or atelectasis at the right base. Stable non obscured heart size. IMPRESSION: Large left pleural effusion with concerning masslike appearance at the left apex. Recommend chest CT with contrast. Electronically Signed   By: Monte Fantasia M.D.   On: 12/18/2016 14:14   Ct Chest W Contrast  Result Date:  12/18/2016 CLINICAL DATA:  Chronic upper abdominal pain under the ribs for 6 weeks. Shortness of breath. Generalized chest and back pain, with difficulty breathing. Initial encounter. EXAM: CT CHEST, ABDOMEN, AND PELVIS WITH CONTRAST TECHNIQUE: Multidetector CT imaging of the chest, abdomen and pelvis was performed following the standard protocol during bolus administration of intravenous contrast. CONTRAST:  100 mL ISOVUE-300 IOPAMIDOL (ISOVUE-300) INJECTION 61% COMPARISON:  Chest radiograph performed earlier today at 2:04 p.m. FINDINGS: CT CHEST FINDINGS Cardiovascular: The heart remains normal in size. The ascending thoracic aorta is borderline normal in caliber. Minimal calcification is seen along the aortic arch and descending thoracic aorta. The great vessels are grossly unremarkable in appearance. Mediastinum/Nodes: There is diffusely infiltrating mass along the left side of the mediastinum, with associated enlarged nodes measuring up to 1.7 cm at the aortopulmonary window. The mass measures 3.6 cm adjacent to the superior mediastinum, with diffuse pleural extension of disease noted about much of the left hemithorax. No pericardial effusion is identified. The visualized portions of the thyroid gland are unremarkable. No axillary lymphadenopathy is appreciated. Lungs/Pleura: A very large left-sided pleural effusion occupies most of the left hemithorax. The expanded portions of the left lung are grossly clear. There is dense consolidation of much of the left lung; underlying mass cannot be excluded. Diffuse pleural thickening, most prominent along the mediastinum and along the posterior and inferior aspects of the hemithorax, reflects the patient's lung malignancy. A 2.1 cm nodular opacity at the right lung base is nonspecific but could reflect metastatic disease. Scattered blebs are noted at the lung apices. No pneumothorax is seen. Scattered nodules are noted about the left epicardial fat pad, reflecting  metastatic disease. Musculoskeletal: No acute osseous abnormalities are identified. The visualized musculature is unremarkable in appearance. CT ABDOMEN PELVIS FINDINGS Hepatobiliary: The liver is unremarkable in appearance. The gallbladder is unremarkable in appearance. The common bile duct remains normal in caliber. Pancreas: The pancreas is within normal limits. Spleen: The spleen is unremarkable in appearance. Adrenals/Urinary Tract: The adrenal glands are unremarkable in appearance. The kidneys are within normal limits. There is no evidence of hydronephrosis. No renal or ureteral stones are identified. No perinephric stranding is  seen. Stomach/Bowel: The stomach is unremarkable in appearance. The small bowel is within normal limits. The patient is status post appendectomy. The colon is unremarkable in appearance. Vascular/Lymphatic: There is ectasia of the distal abdominal aorta to 2.7 cm in AP dimension, without evidence of aneurysmal dilatation. Scattered calcification is seen along the abdominal aorta and its branches. Mildly prominent retroperitoneal nodes, measuring up to 8 mm in short axis, are nonspecific but may reflect metastatic disease. No pelvic sidewall lymphadenopathy is seen. Reproductive: The bladder is moderately distended and grossly unremarkable. The prostate remains normal in size. Other: No additional soft tissue abnormalities are seen. Musculoskeletal: No acute osseous abnormalities are identified. The visualized musculature is unremarkable in appearance. IMPRESSION: 1. Very large left-sided pleural effusion occupies most of the left hemithorax, with dense consolidation of much of the left lung. Underlying mass cannot be excluded. 2. Diffusely infiltrating mass along the left side of the mediastinum, measuring 3.6 cm adjacent to the superior mediastinum, and extending about much of the pleura of the left hemithorax, particularly medially, inferiorly and posteriorly. 3. Underlying  mediastinal lymphadenopathy measures up to 1.7 cm, compatible with metastatic disease. 4. 2.1 cm nodular opacity at the right lung base is nonspecific but could reflect metastatic disease. 5. Scattered nodules about the left epicardial fat pad, reflecting metastatic disease. 6. Scattered blebs at the lung apices. 7. Mildly prominent retroperitoneal nodes, measuring up to 8 mm in short axis, are nonspecific but could reflect metastatic disease. 8. Scattered aortic atherosclerosis. Ectatic abdominal aorta at risk for aneurysm development. Recommend followup by ultrasound in 5 years. This recommendation follows ACR consensus guidelines: White Paper of the ACR Incidental Findings Committee II on Vascular Findings. J Am Coll Radiol 2013; 10:789-794. Electronically Signed   By: Garald Balding M.D.   On: 12/18/2016 18:28   Mr Jeri Cos OZ Contrast  Result Date: 12/18/2016 CLINICAL DATA:  Lung cancer.  Evaluation for metastatic disease. EXAM: MRI HEAD WITHOUT AND WITH CONTRAST TECHNIQUE: Multiplanar, multiecho pulse sequences of the brain and surrounding structures were obtained without and with intravenous contrast. CONTRAST:  5m MULTIHANCE GADOBENATE DIMEGLUMINE 529 MG/ML IV SOLN COMPARISON:  Brain MRI 11/14/2005 FINDINGS: Brain: No focal diffusion restriction to indicate acute infarct. No intraparenchymal hemorrhage. There is a punctate old infarct in the left cerebellum and a small, old lacunar infarct of the right basal ganglia. There is mild multifocal hyperintense T2-weighted signal within the periventricular white matter, most often seen in the setting of chronic microvascular ischemia. No mass lesion or midline shift. No hydrocephalus or extra-axial fluid collection. The midline structures are normal. No age advanced or lobar predominant atrophy. No contrast-enhancing lesions. Vascular: Major intracranial arterial and venous sinus flow voids are preserved. No evidence of chronic microhemorrhage or amyloid  angiopathy. Skull and upper cervical spine: The visualized skull base, calvarium, upper cervical spine and extracranial soft tissues are normal. Sinuses/Orbits: No fluid levels or advanced mucosal thickening. No mastoid effusion. Normal orbits. IMPRESSION: 1. No intracranial metastatic disease. 2. 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 Electronically Signed   By: KUlyses JarredM.D.   On: 12/18/2016 22:07   Ct Abdomen Pelvis W Contrast  Result Date: 12/18/2016 CLINICAL DATA:  Chronic upper abdominal pain under the ribs for 6 weeks. Shortness of breath. Generalized chest and back pain, with difficulty breathing. Initial encounter. EXAM: CT CHEST, ABDOMEN, AND PELVIS WITH CONTRAST TECHNIQUE: Multidetector CT imaging of the chest, abdomen and pelvis was  performed following the standard protocol during bolus administration of intravenous contrast. CONTRAST:  100 mL ISOVUE-300 IOPAMIDOL (ISOVUE-300) INJECTION 61% COMPARISON:  Chest radiograph performed earlier today at 2:04 p.m. FINDINGS: CT CHEST FINDINGS Cardiovascular: The heart remains normal in size. The ascending thoracic aorta is borderline normal in caliber. Minimal calcification is seen along the aortic arch and descending thoracic aorta. The great vessels are grossly unremarkable in appearance. Mediastinum/Nodes: There is diffusely infiltrating mass along the left side of the mediastinum, with associated enlarged nodes measuring up to 1.7 cm at the aortopulmonary window. The mass measures 3.6 cm adjacent to the superior mediastinum, with diffuse pleural extension of disease noted about much of the left hemithorax. No pericardial effusion is identified. The visualized portions of the thyroid gland are unremarkable. No axillary lymphadenopathy is appreciated. Lungs/Pleura: A very large left-sided pleural effusion occupies most of the left hemithorax. The expanded portions of the  left lung are grossly clear. There is dense consolidation of much of the left lung; underlying mass cannot be excluded. Diffuse pleural thickening, most prominent along the mediastinum and along the posterior and inferior aspects of the hemithorax, reflects the patient's lung malignancy. A 2.1 cm nodular opacity at the right lung base is nonspecific but could reflect metastatic disease. Scattered blebs are noted at the lung apices. No pneumothorax is seen. Scattered nodules are noted about the left epicardial fat pad, reflecting metastatic disease. Musculoskeletal: No acute osseous abnormalities are identified. The visualized musculature is unremarkable in appearance. CT ABDOMEN PELVIS FINDINGS Hepatobiliary: The liver is unremarkable in appearance. The gallbladder is unremarkable in appearance. The common bile duct remains normal in caliber. Pancreas: The pancreas is within normal limits. Spleen: The spleen is unremarkable in appearance. Adrenals/Urinary Tract: The adrenal glands are unremarkable in appearance. The kidneys are within normal limits. There is no evidence of hydronephrosis. No renal or ureteral stones are identified. No perinephric stranding is seen. Stomach/Bowel: The stomach is unremarkable in appearance. The small bowel is within normal limits. The patient is status post appendectomy. The colon is unremarkable in appearance. Vascular/Lymphatic: There is ectasia of the distal abdominal aorta to 2.7 cm in AP dimension, without evidence of aneurysmal dilatation. Scattered calcification is seen along the abdominal aorta and its branches. Mildly prominent retroperitoneal nodes, measuring up to 8 mm in short axis, are nonspecific but may reflect metastatic disease. No pelvic sidewall lymphadenopathy is seen. Reproductive: The bladder is moderately distended and grossly unremarkable. The prostate remains normal in size. Other: No additional soft tissue abnormalities are seen. Musculoskeletal: No acute  osseous abnormalities are identified. The visualized musculature is unremarkable in appearance. IMPRESSION: 1. Very large left-sided pleural effusion occupies most of the left hemithorax, with dense consolidation of much of the left lung. Underlying mass cannot be excluded. 2. Diffusely infiltrating mass along the left side of the mediastinum, measuring 3.6 cm adjacent to the superior mediastinum, and extending about much of the pleura of the left hemithorax, particularly medially, inferiorly and posteriorly. 3. Underlying mediastinal lymphadenopathy measures up to 1.7 cm, compatible with metastatic disease. 4. 2.1 cm nodular opacity at the right lung base is nonspecific but could reflect metastatic disease. 5. Scattered nodules about the left epicardial fat pad, reflecting metastatic disease. 6. Scattered blebs at the lung apices. 7. Mildly prominent retroperitoneal nodes, measuring up to 8 mm in short axis, are nonspecific but could reflect metastatic disease. 8. Scattered aortic atherosclerosis. Ectatic abdominal aorta at risk for aneurysm development. Recommend followup by ultrasound in 5 years. This recommendation  follows ACR consensus guidelines: White Paper of the ACR Incidental Findings Committee II on Vascular Findings. J Am Coll Radiol 2013; 10:789-794. Electronically Signed   By: Garald Balding M.D.   On: 12/18/2016 18:28   US Thoracentesis Asp Pleural Space W/img Guide  Result Date: 12/19/2016 INDICATION: Left-sided chest mass. Recurrent large left pleural effusion. Request diagnostic and therapeutic thoracentesis. EXAM: ULTRASOUND GUIDED LEFT THORACENTESIS MEDICATIONS: None. COMPLICATIONS: None immediate. Postprocedural chest x-ray negative for pneumothorax. PROCEDURE: An ultrasound guided thoracentesis was thoroughly discussed with the patient and questions answered. The benefits, risks, alternatives and complications were also discussed. The patient understands and wishes to proceed with the  procedure. Written consent was obtained. Ultrasound was performed to localize and mark an adequate pocket of fluid in the left chest. The area was then prepped and draped in the normal sterile fashion. 1% Lidocaine was used for local anesthesia. Under ultrasound guidance a Safe-T-Centesis catheter was introduced. Thoracentesis was performed. The catheter was removed and a dressing applied. FINDINGS: A total of approximately 1.1 L of hazy, blood-tinged fluid was removed. Patient developed significant discomfort and cough at this point. The procedure was terminated. Samples were sent to the laboratory as requested by the clinical team. IMPRESSION: Successful ultrasound guided left thoracentesis yielding 1.1 L of pleural fluid. Read by: Ascencion Dike PA-C Electronically Signed   By: Marybelle Killings M.D.   On: 12/19/2016 09:58     I have independently reviewed the above radiologic studies.  Recent Lab Findings: Lab Results  Component Value Date   WBC 7.0 12/19/2016   HGB 10.5 (L) 12/19/2016   HCT 32.3 (L) 12/19/2016   PLT 320 12/19/2016   GLUCOSE 82 12/19/2016   CHOL  02/23/2011    130        ATP III CLASSIFICATION:  <200     mg/dL   Desirable  200-239  mg/dL   Borderline High  >=240    mg/dL   High          TRIG 123 02/23/2011   HDL 26 (L) 02/23/2011   LDLCALC  02/23/2011    79        Total Cholesterol/HDL:CHD Risk Coronary Heart Disease Risk Table                     Men   Women  1/2 Average Risk   3.4   3.3  Average Risk       5.0   4.4  2 X Average Risk   9.6   7.1  3 X Average Risk  23.4   11.0        Use the calculated Patient Ratio above and the CHD Risk Table to determine the patient's CHD Risk.        ATP III CLASSIFICATION (LDL):  <100     mg/dL   Optimal  100-129  mg/dL   Near or Above                    Optimal  130-159  mg/dL   Borderline  160-189  mg/dL   High  >190     mg/dL   Very High   ALT 10 (L) 12/19/2016   AST 19 12/19/2016   NA 134 (L) 12/19/2016   K 4.3  12/19/2016   CL 101 12/19/2016   CREATININE 0.72 12/19/2016   BUN 10 12/19/2016   CO2 23 12/19/2016   INR 1.17 12/11/2016   Assessment / Plan:  1. Large Recurrent Left Pleural Effusion, with mass like appearance of left apex and multiple areas concerning for metastatic lung disease--- previous thoracentesis + for malignant cells 2. Plan: patient with need VATS with pleural biospy, possible placement of pleur-x catheter, tentatively for tomorrow afternoon  The goals risks and alternatives of the planned surgical procedure Bronchoscopy ,Left VATS pleural biopsy placement of Pleurx and possible talc pleurodesis.  have been discussed with the patient in detail. The risks of the procedure including death, infection, stroke, myocardial infarction, bleeding, blood transfusion have all been discussed specifically.  I have quoted Joshua White a 5 % of perioperative mortality and a complication rate as high as 30 %. The patient's questions have been answered.Joshua White is willing  to proceed with the planned procedure.   I  spent 40 minutes counseling the patient face to face and 50% or more the  time was spent in counseling and coordination of care. The total time spent in the appointment was 60 minutes.  Grace Isaac MD      [address] [place] [address] Office [phone]   [place]

## 2016-12-20 ENCOUNTER — Inpatient Hospital Stay (HOSPITAL_COMMUNITY): Payer: Medicare Other | Admitting: Certified Registered Nurse Anesthetist

## 2016-12-20 ENCOUNTER — Encounter (HOSPITAL_COMMUNITY): Payer: Self-pay | Admitting: Surgery

## 2016-12-20 ENCOUNTER — Encounter (HOSPITAL_COMMUNITY)
Admission: EM | Disposition: A | Discharge status: Home / Self Care | Payer: Self-pay | Source: Home / Self Care | Attending: Internal Medicine

## 2016-12-20 ENCOUNTER — Inpatient Hospital Stay (HOSPITAL_COMMUNITY): Payer: Medicare Other

## 2016-12-20 DIAGNOSIS — C3482 Malignant neoplasm of overlapping sites of left bronchus and lung: Secondary | ICD-10-CM

## 2016-12-20 DIAGNOSIS — Z79899 Other long term (current) drug therapy: Secondary | ICD-10-CM

## 2016-12-20 DIAGNOSIS — B182 Chronic viral hepatitis C: Secondary | ICD-10-CM

## 2016-12-20 DIAGNOSIS — R0602 Shortness of breath: Secondary | ICD-10-CM

## 2016-12-20 HISTORY — PX: PLEURAL BIOPSY: SHX5082

## 2016-12-20 HISTORY — PX: CHEST TUBE INSERTION: SHX231

## 2016-12-20 HISTORY — PX: VIDEO ASSISTED THORACOSCOPY (VATS) W/TALC PLEUADESIS: SHX6168

## 2016-12-20 HISTORY — PX: VIDEO BRONCHOSCOPY: SHX5072

## 2016-12-20 LAB — BLOOD GAS, ARTERIAL
Acid-Base Excess: 1.8 mmol/L (ref 0.0–2.0)
Bicarbonate: 25.6 mmol/L (ref 20.0–28.0)
Drawn by: 449561
FIO2: 21
O2 Saturation: 95.1 %
Patient temperature: 98.6
pCO2 arterial: 38 mmHg (ref 32.0–48.0)
pH, Arterial: 7.443 (ref 7.350–7.450)
pO2, Arterial: 78.8 mmHg — ABNORMAL LOW (ref 83.0–108.0)

## 2016-12-20 LAB — GLUCOSE, CAPILLARY: Glucose-Capillary: 171 mg/dL — ABNORMAL HIGH (ref 65–99)

## 2016-12-20 SURGERY — BRONCHOSCOPY, VIDEO-ASSISTED
Anesthesia: General | Site: Chest

## 2016-12-20 MED ORDER — FENTANYL 40 MCG/ML IV SOLN
INTRAVENOUS | Status: DC
  Administered 2016-12-20: 50 ug via INTRAVENOUS
  Administered 2016-12-20: 1000 ug via INTRAVENOUS
  Administered 2016-12-21: 40 ug via INTRAVENOUS
  Administered 2016-12-21: 50 ug via INTRAVENOUS
  Administered 2016-12-21: 250 ug via INTRAVENOUS
  Administered 2016-12-21: 120 ug via INTRAVENOUS
  Administered 2016-12-21: 20 ug via INTRAVENOUS
  Administered 2016-12-21: 60 ug via INTRAVENOUS
  Administered 2016-12-21: 90 ug via INTRAVENOUS
  Administered 2016-12-22: 30 ug via INTRAVENOUS
  Administered 2016-12-22: 40 ug via INTRAVENOUS
  Administered 2016-12-22: 70 ug via INTRAVENOUS
  Administered 2016-12-22: 90 ug via INTRAVENOUS
  Administered 2016-12-22: 1000 ug via INTRAVENOUS
  Administered 2016-12-22: 20 ug via INTRAVENOUS
  Administered 2016-12-23 (×2): 50 ug via INTRAVENOUS
  Filled 2016-12-20 (×3): qty 25

## 2016-12-20 MED ORDER — SENNOSIDES-DOCUSATE SODIUM 8.6-50 MG PO TABS
1.0000 | ORAL_TABLET | Freq: Every day | ORAL | Status: DC
  Administered 2016-12-20 – 2016-12-22 (×3): 1 via ORAL
  Filled 2016-12-20 (×3): qty 1

## 2016-12-20 MED ORDER — KCL IN DEXTROSE-NACL 40-5-0.9 MEQ/L-%-% IV SOLN
INTRAVENOUS | Status: DC
  Administered 2016-12-20 – 2016-12-21 (×2): via INTRAVENOUS
  Filled 2016-12-20 (×7): qty 1000

## 2016-12-20 MED ORDER — TRAMADOL HCL 50 MG PO TABS
50.0000 mg | ORAL_TABLET | Freq: Four times a day (QID) | ORAL | Status: DC | PRN
  Administered 2016-12-23 – 2016-12-24 (×2): 100 mg via ORAL
  Filled 2016-12-20 (×2): qty 2

## 2016-12-20 MED ORDER — SODIUM CHLORIDE 0.9 % IJ SOLN
INTRAMUSCULAR | Status: AC
  Filled 2016-12-20: qty 10

## 2016-12-20 MED ORDER — HYDROCODONE-ACETAMINOPHEN 5-325 MG PO TABS
1.0000 | ORAL_TABLET | Freq: Four times a day (QID) | ORAL | Status: DC | PRN
  Administered 2016-12-20 – 2016-12-23 (×7): 1 via ORAL
  Filled 2016-12-20 (×7): qty 1

## 2016-12-20 MED ORDER — ONDANSETRON HCL 4 MG/2ML IJ SOLN
INTRAMUSCULAR | Status: DC | PRN
  Administered 2016-12-20: 4 mg via INTRAVENOUS

## 2016-12-20 MED ORDER — ELBASVIR-GRAZOPREVIR 50-100 MG PO TABS
1.0000 | ORAL_TABLET | Freq: Every day | ORAL | Status: AC
  Administered 2016-12-20: 1 via ORAL
  Filled 2016-12-20 (×2): qty 1

## 2016-12-20 MED ORDER — SODIUM CHLORIDE 0.9 % IV SOLN
30.0000 meq | Freq: Every day | INTRAVENOUS | Status: DC | PRN
  Filled 2016-12-20: qty 15

## 2016-12-20 MED ORDER — HYDROMORPHONE HCL 1 MG/ML IJ SOLN
0.2500 mg | INTRAMUSCULAR | Status: DC | PRN
  Administered 2016-12-20 (×4): 0.5 mg via INTRAVENOUS

## 2016-12-20 MED ORDER — SUGAMMADEX SODIUM 200 MG/2ML IV SOLN
INTRAVENOUS | Status: DC | PRN
  Administered 2016-12-20: 200 mg via INTRAVENOUS

## 2016-12-20 MED ORDER — VECURONIUM BROMIDE 10 MG IV SOLR
INTRAVENOUS | Status: DC | PRN
  Administered 2016-12-20: 2 mg via INTRAVENOUS
  Administered 2016-12-20: 4 mg via INTRAVENOUS
  Administered 2016-12-20: 6 mg via INTRAVENOUS

## 2016-12-20 MED ORDER — DEXAMETHASONE SODIUM PHOSPHATE 10 MG/ML IJ SOLN
INTRAMUSCULAR | Status: DC | PRN
  Administered 2016-12-20: 10 mg via INTRAVENOUS

## 2016-12-20 MED ORDER — PHENYLEPHRINE 40 MCG/ML (10ML) SYRINGE FOR IV PUSH (FOR BLOOD PRESSURE SUPPORT)
PREFILLED_SYRINGE | INTRAVENOUS | Status: DC | PRN
  Administered 2016-12-20: 80 ug via INTRAVENOUS
  Administered 2016-12-20: 40 ug via INTRAVENOUS
  Administered 2016-12-20: 80 ug via INTRAVENOUS

## 2016-12-20 MED ORDER — NALOXONE HCL 0.4 MG/ML IJ SOLN
0.4000 mg | INTRAMUSCULAR | Status: DC | PRN
Start: 2016-12-20 — End: 2016-12-23

## 2016-12-20 MED ORDER — DIPHENHYDRAMINE HCL 12.5 MG/5ML PO ELIX: 12.5000 mg | ORAL_SOLUTION | Freq: Four times a day (QID) | ORAL | Status: DC | PRN

## 2016-12-20 MED ORDER — LACTATED RINGERS IV SOLN
INTRAVENOUS | Status: DC | PRN
  Administered 2016-12-20 (×2): via INTRAVENOUS

## 2016-12-20 MED ORDER — ENOXAPARIN SODIUM 30 MG/0.3ML ~~LOC~~ SOLN
30.0000 mg | SUBCUTANEOUS | Status: DC
  Administered 2016-12-21 – 2016-12-22 (×2): 30 mg via SUBCUTANEOUS
  Filled 2016-12-20 (×2): qty 0.3

## 2016-12-20 MED ORDER — ROCURONIUM BROMIDE 50 MG/5ML IV SOSY
PREFILLED_SYRINGE | INTRAVENOUS | Status: AC
  Filled 2016-12-20: qty 150

## 2016-12-20 MED ORDER — FENTANYL CITRATE (PF) 250 MCG/5ML IJ SOLN
INTRAMUSCULAR | Status: AC
  Filled 2016-12-20: qty 5

## 2016-12-20 MED ORDER — PROPOFOL 10 MG/ML IV BOLUS
INTRAVENOUS | Status: AC
  Filled 2016-12-20: qty 20

## 2016-12-20 MED ORDER — LACTATED RINGERS IV SOLN
INTRAVENOUS | Status: DC | PRN
  Administered 2016-12-20: 11:00:00 via INTRAVENOUS

## 2016-12-20 MED ORDER — PHENYLEPHRINE HCL 10 MG/ML IJ SOLN
INTRAVENOUS | Status: DC | PRN
  Administered 2016-12-20: 25 ug/min via INTRAVENOUS

## 2016-12-20 MED ORDER — INSULIN ASPART 100 UNIT/ML ~~LOC~~ SOLN
0.0000 [IU] | Freq: Four times a day (QID) | SUBCUTANEOUS | Status: DC
  Administered 2016-12-21: 4 [IU] via SUBCUTANEOUS
  Administered 2016-12-21: 2 [IU] via SUBCUTANEOUS

## 2016-12-20 MED ORDER — ONDANSETRON HCL 4 MG/2ML IJ SOLN: 4.0000 mg | Freq: Four times a day (QID) | INTRAMUSCULAR | Status: DC | PRN

## 2016-12-20 MED ORDER — FENTANYL CITRATE (PF) 100 MCG/2ML IJ SOLN
INTRAMUSCULAR | Status: DC | PRN
  Administered 2016-12-20 (×7): 50 ug via INTRAVENOUS

## 2016-12-20 MED ORDER — LACTATED RINGERS IV SOLN
INTRAVENOUS | Status: DC
  Administered 2016-12-20: 11:00:00 via INTRAVENOUS

## 2016-12-20 MED ORDER — BISACODYL 5 MG PO TBEC
10.0000 mg | DELAYED_RELEASE_TABLET | Freq: Every day | ORAL | Status: DC
  Administered 2016-12-21 – 2016-12-25 (×5): 10 mg via ORAL
  Filled 2016-12-20 (×6): qty 2

## 2016-12-20 MED ORDER — ACETAMINOPHEN 160 MG/5ML PO SOLN: 1000.0000 mg | Freq: Four times a day (QID) | ORAL | Status: DC

## 2016-12-20 MED ORDER — PROPOFOL 10 MG/ML IV BOLUS
INTRAVENOUS | Status: DC | PRN
  Administered 2016-12-20: 80 mg via INTRAVENOUS

## 2016-12-20 MED ORDER — HEPARIN SODIUM (PORCINE) 1000 UNIT/ML IJ SOLN
INTRAMUSCULAR | Status: AC
  Filled 2016-12-20: qty 1

## 2016-12-20 MED ORDER — VECURONIUM BROMIDE 10 MG IV SOLR
INTRAVENOUS | Status: AC
  Filled 2016-12-20: qty 10

## 2016-12-20 MED ORDER — SODIUM CHLORIDE 0.9% FLUSH: 9.0000 mL | INTRAVENOUS | Status: DC | PRN

## 2016-12-20 MED ORDER — ACETAMINOPHEN 500 MG PO TABS
1000.0000 mg | ORAL_TABLET | Freq: Four times a day (QID) | ORAL | Status: DC
  Administered 2016-12-21 – 2016-12-23 (×11): 1000 mg via ORAL
  Filled 2016-12-20 (×11): qty 2

## 2016-12-20 MED ORDER — 0.9 % SODIUM CHLORIDE (POUR BTL) OPTIME
TOPICAL | Status: DC | PRN
  Administered 2016-12-20: 2000 mL

## 2016-12-20 MED ORDER — DEXTROSE 5 % IV SOLN
1.5000 g | Freq: Two times a day (BID) | INTRAVENOUS | Status: AC
  Administered 2016-12-21 (×2): 1.5 g via INTRAVENOUS
  Filled 2016-12-20 (×2): qty 1.5

## 2016-12-20 MED ORDER — MIDAZOLAM HCL 2 MG/2ML IJ SOLN
INTRAMUSCULAR | Status: AC
  Filled 2016-12-20: qty 2

## 2016-12-20 MED ORDER — ORAL CARE MOUTH RINSE
15.0000 mL | Freq: Two times a day (BID) | OROMUCOSAL | Status: DC
  Administered 2016-12-21 – 2016-12-26 (×8): 15 mL via OROMUCOSAL

## 2016-12-20 MED ORDER — HYDROMORPHONE HCL 1 MG/ML IJ SOLN
INTRAMUSCULAR | Status: AC
  Administered 2016-12-20: 0.5 mg via INTRAVENOUS
  Filled 2016-12-20: qty 2

## 2016-12-20 MED ORDER — MIDAZOLAM HCL 5 MG/5ML IJ SOLN
INTRAMUSCULAR | Status: DC | PRN
  Administered 2016-12-20: 2 mg via INTRAVENOUS

## 2016-12-20 MED ORDER — TALC (STERITALC) POWDER FOR INTRAPLEURAL USE
4.0000 g | INTRAPLEURAL | Status: AC
  Administered 2016-12-20: 4 g via INTRAPLEURAL
  Filled 2016-12-20: qty 4

## 2016-12-20 MED ORDER — PANTOPRAZOLE SODIUM 40 MG PO TBEC
40.0000 mg | DELAYED_RELEASE_TABLET | Freq: Every day | ORAL | Status: DC
  Administered 2016-12-21 – 2016-12-26 (×6): 40 mg via ORAL
  Filled 2016-12-20 (×6): qty 1

## 2016-12-20 MED ORDER — DIPHENHYDRAMINE HCL 50 MG/ML IJ SOLN: 12.5000 mg | Freq: Four times a day (QID) | INTRAMUSCULAR | Status: DC | PRN

## 2016-12-20 MED FILL — Talc Intrapleural Powder For Susp 5 GM: INTRAPLEURAL | Qty: 4 | Status: AC

## 2016-12-20 SURGICAL SUPPLY — 100 items
BLADE SURG 11 STRL SS (BLADE) IMPLANT
BRUSH CYTOL CELLEBRITY 1.5X140 (MISCELLANEOUS) IMPLANT
BRUSH SCRUB EZ PLAIN DRY (MISCELLANEOUS) IMPLANT
CANISTER SUCTION 2500CC (MISCELLANEOUS) ×3 IMPLANT
CATH KIT ON Q 5IN SLV (PAIN MANAGEMENT) IMPLANT
CATH ROBINSON RED A/P 18FR (CATHETERS) ×3 IMPLANT
CATH ROBINSON RED A/P 22FR (CATHETERS) IMPLANT
CATH THORACIC 28FR (CATHETERS) IMPLANT
CATH THORACIC 36FR (CATHETERS) IMPLANT
CATH THORACIC 36FR RT ANG (CATHETERS) IMPLANT
CONN ST 1/4X3/8  BEN (MISCELLANEOUS) ×2
CONN ST 1/4X3/8 BEN (MISCELLANEOUS) ×4 IMPLANT
CONN Y 3/8X3/8X3/8  BEN (MISCELLANEOUS)
CONN Y 3/8X3/8X3/8 BEN (MISCELLANEOUS) IMPLANT
CONT SPEC 4OZ CLIKSEAL STRL BL (MISCELLANEOUS) ×21 IMPLANT
COVER SURGICAL LIGHT HANDLE (MISCELLANEOUS) IMPLANT
COVER TABLE BACK 60X90 (DRAPES) ×3 IMPLANT
COVER TRANSDUCER ULTRASND GEL (DRAPE) IMPLANT
DERMABOND ADVANCED (GAUZE/BANDAGES/DRESSINGS) ×1
DERMABOND ADVANCED .7 DNX12 (GAUZE/BANDAGES/DRESSINGS) ×2 IMPLANT
DRAIN CHANNEL 28F RND 3/8 FF (WOUND CARE) ×3 IMPLANT
DRAPE C-ARM 42X72 X-RAY (DRAPES) IMPLANT
DRAPE LAPAROSCOPIC ABDOMINAL (DRAPES) ×3 IMPLANT
DRAPE SLUSH/WARMER DISC (DRAPES) ×3 IMPLANT
DRAPE WARM FLUID 44X44 (DRAPE) IMPLANT
DRILL BIT 7/64X5 (BIT) IMPLANT
ELECT BLADE 4.0 EZ CLEAN MEGAD (MISCELLANEOUS) ×6
ELECT REM PT RETURN 9FT ADLT (ELECTROSURGICAL) ×3
ELECTRODE BLDE 4.0 EZ CLN MEGD (MISCELLANEOUS) ×4 IMPLANT
ELECTRODE REM PT RTRN 9FT ADLT (ELECTROSURGICAL) ×2 IMPLANT
FORCEPS BIOP RJ4 1.8 (CUTTING FORCEPS) IMPLANT
GAUZE SPONGE 4X4 12PLY STRL (GAUZE/BANDAGES/DRESSINGS) ×3 IMPLANT
GLOVE BIO SURGEON STRL SZ 6.5 (GLOVE) ×18 IMPLANT
GLOVE BIO SURGEON STRL SZ7 (GLOVE) ×12 IMPLANT
GLOVE BIOGEL PI IND STRL 6.5 (GLOVE) ×8 IMPLANT
GLOVE BIOGEL PI IND STRL 7.0 (GLOVE) ×8 IMPLANT
GLOVE BIOGEL PI INDICATOR 6.5 (GLOVE) ×4
GLOVE BIOGEL PI INDICATOR 7.0 (GLOVE) ×4
GOWN STRL REUS W/ TWL LRG LVL3 (GOWN DISPOSABLE) ×8 IMPLANT
GOWN STRL REUS W/TWL LRG LVL3 (GOWN DISPOSABLE) ×4
KIT BASIN OR (CUSTOM PROCEDURE TRAY) ×3 IMPLANT
KIT CLEAN ENDO COMPLIANCE (KITS) ×3 IMPLANT
KIT PLEURX DRAIN CATH 1000ML (MISCELLANEOUS) IMPLANT
KIT PLEURX DRAIN CATH 15.5FR (DRAIN) IMPLANT
KIT PLEURX DRAIN CATH 500ML (KITS) ×3 IMPLANT
KIT ROOM TURNOVER OR (KITS) ×3 IMPLANT
KIT SUCTION CATH 14FR (SUCTIONS) ×6 IMPLANT
MARKER SKIN DUAL TIP RULER LAB (MISCELLANEOUS) IMPLANT
NEEDLE BIOPSY TRANSBRONCH 21G (NEEDLE) IMPLANT
NS IRRIG 1000ML POUR BTL (IV SOLUTION) ×6 IMPLANT
OIL SILICONE PENTAX (PARTS (SERVICE/REPAIRS)) ×3 IMPLANT
PACK CHEST (CUSTOM PROCEDURE TRAY) ×3 IMPLANT
PACK GENERAL/GYN (CUSTOM PROCEDURE TRAY) IMPLANT
PAD ARMBOARD 7.5X6 YLW CONV (MISCELLANEOUS) ×9 IMPLANT
PASSER SUT SWANSON 36MM LOOP (INSTRUMENTS) IMPLANT
SCISSORS LAP 5X35 DISP (ENDOMECHANICALS) IMPLANT
SEALANT PROGEL (MISCELLANEOUS) IMPLANT
SEALANT SURG COSEAL 4ML (VASCULAR PRODUCTS) IMPLANT
SEALANT SURG COSEAL 8ML (VASCULAR PRODUCTS) IMPLANT
SET DRAINAGE LINE (MISCELLANEOUS) IMPLANT
SOLUTION ANTI FOG 6CC (MISCELLANEOUS) ×3 IMPLANT
SPONGE GAUZE 4X4 12PLY STER LF (GAUZE/BANDAGES/DRESSINGS) ×3 IMPLANT
SUT BONE WAX W31G (SUTURE) ×3 IMPLANT
SUT ETHILON 3 0 FSL (SUTURE) ×3 IMPLANT
SUT PROLENE 3 0 SH DA (SUTURE) IMPLANT
SUT PROLENE 4 0 RB 1 (SUTURE)
SUT PROLENE 4-0 RB1 .5 CRCL 36 (SUTURE) IMPLANT
SUT SILK  1 MH (SUTURE) ×3
SUT SILK 1 MH (SUTURE) ×6 IMPLANT
SUT SILK 1 TIES 10X30 (SUTURE) IMPLANT
SUT SILK 2 0SH CR/8 30 (SUTURE) IMPLANT
SUT SILK 3 0SH CR/8 30 (SUTURE) IMPLANT
SUT VIC AB 1 CTX 18 (SUTURE) IMPLANT
SUT VIC AB 1 CTX 36 (SUTURE)
SUT VIC AB 1 CTX36XBRD ANBCTR (SUTURE) IMPLANT
SUT VIC AB 2-0 CTX 36 (SUTURE) IMPLANT
SUT VIC AB 2-0 UR6 27 (SUTURE) ×3 IMPLANT
SUT VIC AB 3-0 X1 27 (SUTURE) ×3 IMPLANT
SUT VICRYL 0 UR6 27IN ABS (SUTURE) IMPLANT
SUT VICRYL 2 TP 1 (SUTURE) IMPLANT
SWAB COLLECTION DEVICE MRSA (MISCELLANEOUS) IMPLANT
SYR 20ML ECCENTRIC (SYRINGE) ×6 IMPLANT
SYR BULB IRRIGATION 50ML (SYRINGE) ×3 IMPLANT
SYSTEM SAHARA CHEST DRAIN ATS (WOUND CARE) ×3 IMPLANT
TAPE CLOTH 4X10 WHT NS (GAUZE/BANDAGES/DRESSINGS) ×3 IMPLANT
TAPE CLOTH SURG 4X10 WHT LF (GAUZE/BANDAGES/DRESSINGS) ×3 IMPLANT
TAPE UMBILICAL COTTON 1/8X30 (MISCELLANEOUS) IMPLANT
TIP APPLICATOR SPRAY EXTEND 16 (VASCULAR PRODUCTS) IMPLANT
TOWEL GREEN STERILE (TOWEL DISPOSABLE) IMPLANT
TOWEL GREEN STERILE FF (TOWEL DISPOSABLE) ×3 IMPLANT
TOWEL OR 17X24 6PK STRL BLUE (TOWEL DISPOSABLE) IMPLANT
TOWEL OR 17X26 10 PK STRL BLUE (TOWEL DISPOSABLE) ×3 IMPLANT
TRAP SPECIMEN MUCOUS 40CC (MISCELLANEOUS) ×3 IMPLANT
TRAY FOLEY CATH SILVER 16FR LF (SET/KITS/TRAYS/PACK) ×3 IMPLANT
TROCAR BLADELESS 12MM (ENDOMECHANICALS) IMPLANT
TUBE ANAEROBIC SPECIMEN COL (MISCELLANEOUS) IMPLANT
TUBE CONNECTING 20X1/4 (TUBING) ×6 IMPLANT
TUNNELER SHEATH ON-Q 11GX8 DSP (PAIN MANAGEMENT) ×3 IMPLANT
VALVE REPLACEMENT CAP (MISCELLANEOUS) IMPLANT
WATER STERILE IRR 1000ML POUR (IV SOLUTION) ×9 IMPLANT

## 2016-12-20 NOTE — H&P (View-Only) (Signed)
[address]       [place] [address]             [phone]        Joshua White Joshua White Medical Record #[id number] Date of Birth: September 06, 1951  Referring: Dr. Ophelia Shoulder Primary Care: No PCP Per Patient  Chief Complaint:    Chief Complaint  Patient presents with  . Shortness of Breath   History of Present Illness:      66 yo AA male with history of Hypotension, G6PD deficiency, RBBB and significant smoking history of 2 ppd for at least 30 years.  He presented to th ED a week ago with complaints of shortness of breath.  At that time he was found to have a large pleural effusion.  He underwent thoracentesis which removed >1L of fluid and cytology identified atypical cells concerning for malignancy.  He followed up with his PCP on 2/19 as scheduled at which point he was again short of breath.  He was referred to the ED for admission.  Patient admits to being short of breath for the past 3 weeks.  This has progressed over the past 4-5 days.  He also admits to drenching night sweats, fatigue and weight loss of about 20 lbs over the past 3 months.  CT scan was obtained on presentation to the ED which showed a large left sided pleural effusion.  There was also mass like appearance of the left apex.  There were also multiple areas of infiltrating structures concerning for metastatic disease.  Thoracentesis was again repeated today with relief of symptoms.  Cardiothoracic surgery consult has been requested for VATS with pleural biopsy.  Patient currently has no new complaints.  He states he is feeling somewhat better.  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  He has a history of Hepatitis C also and has completed treatment a few weeks ago.  Current Activity/ Functional Status: Patient is independent with mobility/ambulation, transfers, ADL's, IADL's.   Zubrod Score: At the time of  surgery this patient's most appropriate activity status/level should be described as: '[]'$     0    Normal activity, no symptoms '[x]'$     1    Restricted in physical strenuous activity but ambulatory, able to do out light work '[]'$     2    Ambulatory and capable of self care, unable to do work activities, up and about                 more than 50%  Of the time                            '[]'$     3    Only limited self care, in bed greater than 50% of waking hours '[]'$     4    Completely disabled, no self care, confined to bed or chair '[]'$     5    Moribund  Past Medical History:  Diagnosis Date  . G6PD deficiency ([place])   . H/O orthostatic hypotension   . Pneumonia   . RBBB (right bundle branch block)   . Seizures ([place])     Past Surgical History:  Procedure Laterality Date  . APPENDECTOMY    . US ECHOCARDIOGRAPHY  05/12/2008   EF 55-60%    History  Smoking Status  .  Former Smoker  . Packs/day: 2.00  . Years: 30.00  . Types: Cigars  Smokeless Tobacco  . Never Used    Comment: Quit ~2017    History  Alcohol Use No    Comment: Former use    Social History   Social History  . Marital status: Single    Spouse name: N/A  . Number of children: N/A  . Years of education: N/A   Occupational History  . Not on file.   Social History Main Topics  . Smoking status: Former Smoker    Packs/day: 2.00    Years: 30.00    Types: Cigars  . Smokeless tobacco: Never Used     Comment: Quit ~2017  . Alcohol use No     Comment: Former use  . Drug use: No  . Sexual activity: Not on file   Other Topics Concern  . Not on file   Social History Narrative  . No narrative on file    Allergies  Allergen Reactions  . Tuberculin Purified Protein Derivative Swelling    abscess   . Sulfa Antibiotics     Current Facility-Administered Medications  Medication Dose Route Frequency Provider Last Rate Last Dose  . Elbasvir-Grazoprevir 50-100 MG TABS 1 tablet  1 tablet Oral Daily Zada Finders, MD       . enoxaparin (LOVENOX) injection 40 mg  40 mg Subcutaneous Q24H Zada Finders, MD   40 mg at 12/18/16 2307  . HYDROcodone-acetaminophen (NORCO/VICODIN) 5-325 MG per tablet 1 tablet  1 tablet Oral Q6H PRN Zada Finders, MD   1 tablet at 12/19/16 [phone]  . lidocaine (PF) (XYLOCAINE) 1 % injection           . naproxen (NAPROSYN) tablet 500 mg  500 mg Oral BID PRN Zada Finders, MD      . ondansetron (ZOFRAN) tablet 4 mg  4 mg Oral Q6H PRN Zada Finders, MD       Or  . ondansetron (ZOFRAN) injection 4 mg  4 mg Intravenous Q6H PRN Zada Finders, MD      . sodium chloride flush (NS) 0.9 % injection 3 mL  3 mL Intravenous Q12H Zada Finders, MD   3 mL at 12/19/16 1114    Prescriptions Prior to Admission  Medication Sig Dispense Refill Last Dose  . aspirin EC 81 MG tablet Take 162 mg by mouth daily.   12/18/2016 at Unknown time  . Elbasvir-Grazoprevir (ZEPATIER) 50-100 MG TABS Take 1 tablet by mouth daily. 28 tablet 2 12/18/2016 at Unknown time  . ibuprofen (ADVIL,MOTRIN) 200 MG tablet Take 200 mg by mouth every 4 (four) hours as needed for mild pain.   12/18/2016 at Unknown time  . HYDROcodone-acetaminophen (NORCO/VICODIN) 5-325 MG per tablet Take 1 tablet by mouth every 6 (six) hours as needed for moderate pain. (Patient not taking: Reported on 10/17/2016) 15 tablet 0 Not Taking at Unknown time  . ibuprofen (ADVIL,MOTRIN) 800 MG tablet Take 1 tablet (800 mg total) by mouth every 8 (eight) hours as needed. (Patient not taking: Reported on 12/11/2016) 21 tablet 0 Not Taking at Unknown time  . naproxen (NAPROSYN) 500 MG tablet Take 1 tablet (500 mg total) by mouth 2 (two) times daily with a meal. (Patient not taking: Reported on 10/17/2016) 30 tablet 0 Not Taking at Unknown time    Family History  Problem Relation Age of Onset  . Lung cancer Mother   . Brain cancer Mother   . Heart disease Father   .  Hypertension Father   . Heart attack Father    Review of Systems:  Constitutional: positive for fatigue,  night sweats and weight loss Eyes: negative Respiratory: positive for dyspnea on exertion and pleurisy/chest pain Cardiovascular: positive for fatigue Gastrointestinal: negative Musculoskeletal:negative Neurological: negative     Cardiac Review of Systems: Y or N  Chest Pain [    ]  Resting SOB [ y  ] Exertional SOB  Blue.Reese  ]  Orthopnea [  ]   Pedal Edema [   ]    Palpitations [  ] Syncope  [  ]   Presyncope [   ]  General Review of Systems: [Y] = yes [  ]=no Constitional: recent weight change [ y ]; anorexia [  ]; fatigue [  ]; nausea [  ]; night sweats [ y ]; fever [  ]; or chills [  ]                                                               Dental: poor dentition[  ]; Last Dentist visit:   Eye : blurred vision [  ]; diplopia [   ]; vision changes [  ];  Amaurosis fugax[  ]; Resp: cough [  ];  wheezing[  ];  hemoptysis[  ]; shortness of breath[ y ]; paroxysmal nocturnal dyspnea[  ]; dyspnea on exertion[  ]; or orthopnea[  ];  GI:  gallstones[  ], vomiting[  ];  dysphagia[  ]; melena[  ];  hematochezia [  ]; heartburn[  ];   Hx of  Colonoscopy[  ]; GU: kidney stones [  ]; hematuria[  ];   dysuria [  ];  nocturia[  ];  history of     obstruction [  ]; urinary frequency [  ]             Skin: rash, swelling[  ];, hair loss[  ];  peripheral edema[  ];  or itching[  ]; Musculosketetal: myalgias[  ];  joint swelling[  ];  joint erythema[  ];  joint pain[  ];  back pain[  ];  Heme/Lymph: bruising[  ];  bleeding[  ];  anemia[  ];  Neuro: TIA[  ];  headaches[  ];  stroke[  ];  vertigo[  ];  seizures[  ];   paresthesias[  ];  difficulty walking[  ];  Psych:depression[  ]; anxiety[  ];  Endocrine: diabetes[  ];  thyroid dysfunction[  ];  Immunizations: Flu [  ]; Pneumococcal[  ];  Other:  Physical Exam: BP (!) 145/77   Pulse 77   Temp 99.2 F (37.3 C) (Oral)   Resp (!) 23   Ht '5\' 9"'$  (1.753 m)   Wt 139 lb 11.2 oz (63.4 kg)   SpO2 97%   BMI 20.63 kg/m   General appearance: alert and  cooperative Head: Normocephalic, without obvious abnormality, atraumatic Lymph nodes: Cervical, supraclavicular, and axillary nodes normal. Resp: diminished breath sounds bibasilar Cardio: regular rate and rhythm GI: soft, non-tender; bowel sounds normal; no masses,  no organomegaly Extremities: extremities normal, atraumatic, no cyanosis or edema Neurologic: Grossly normal  Diagnostic Studies & Laboratory data:     Recent Radiology Findings:   Dg Chest 1 View  Result Date: 12/19/2016 CLINICAL DATA:  Status post left-sided thoracentesis. Known left-sided superior mediastinal mass. EXAM: CHEST 1 VIEW COMPARISON:  Chest x-ray of December 18 2016 and chest CT scan of December 18, 2016. FINDINGS: The volume of pleural fluid on the left has decreased somewhat. No postprocedure pneumothorax is observed. The right lung is well-expanded and clear. The right heart border is normal. The left heart border is obscured as are the aortic arch and left hilar structures. Masslike density above the aortic arch is again demonstrated. The observed bony thorax exhibits no acute abnormality. IMPRESSION: No postprocedure pneumothorax follow-up following left-sided thoracentesis. There is decreased pleural effusion on the left. Electronically Signed   By: David  Martinique M.D.   On: 12/19/2016 09:56   Dg Chest 2 View  Result Date: 12/18/2016 CLINICAL DATA:  Shortness of breath and cough.  Chest pain. EXAM: CHEST  2 VIEW COMPARISON:  Seven days prior FINDINGS: Persistently enlarged left pleural effusion, reaching the apex. Masslike appearance over the medial left apex, superimposed on the aortic knob. Mild or atelectasis at the right base. Stable non obscured heart size. IMPRESSION: Large left pleural effusion with concerning masslike appearance at the left apex. Recommend chest CT with contrast. Electronically Signed   By: Monte Fantasia M.D.   On: 12/18/2016 14:14   Ct Chest W Contrast  Result Date:  12/18/2016 CLINICAL DATA:  Chronic upper abdominal pain under the ribs for 6 weeks. Shortness of breath. Generalized chest and back pain, with difficulty breathing. Initial encounter. EXAM: CT CHEST, ABDOMEN, AND PELVIS WITH CONTRAST TECHNIQUE: Multidetector CT imaging of the chest, abdomen and pelvis was performed following the standard protocol during bolus administration of intravenous contrast. CONTRAST:  100 mL ISOVUE-300 IOPAMIDOL (ISOVUE-300) INJECTION 61% COMPARISON:  Chest radiograph performed earlier today at 2:04 p.m. FINDINGS: CT CHEST FINDINGS Cardiovascular: The heart remains normal in size. The ascending thoracic aorta is borderline normal in caliber. Minimal calcification is seen along the aortic arch and descending thoracic aorta. The great vessels are grossly unremarkable in appearance. Mediastinum/Nodes: There is diffusely infiltrating mass along the left side of the mediastinum, with associated enlarged nodes measuring up to 1.7 cm at the aortopulmonary window. The mass measures 3.6 cm adjacent to the superior mediastinum, with diffuse pleural extension of disease noted about much of the left hemithorax. No pericardial effusion is identified. The visualized portions of the thyroid gland are unremarkable. No axillary lymphadenopathy is appreciated. Lungs/Pleura: A very large left-sided pleural effusion occupies most of the left hemithorax. The expanded portions of the left lung are grossly clear. There is dense consolidation of much of the left lung; underlying mass cannot be excluded. Diffuse pleural thickening, most prominent along the mediastinum and along the posterior and inferior aspects of the hemithorax, reflects the patient's lung malignancy. A 2.1 cm nodular opacity at the right lung base is nonspecific but could reflect metastatic disease. Scattered blebs are noted at the lung apices. No pneumothorax is seen. Scattered nodules are noted about the left epicardial fat pad, reflecting  metastatic disease. Musculoskeletal: No acute osseous abnormalities are identified. The visualized musculature is unremarkable in appearance. CT ABDOMEN PELVIS FINDINGS Hepatobiliary: The liver is unremarkable in appearance. The gallbladder is unremarkable in appearance. The common bile duct remains normal in caliber. Pancreas: The pancreas is within normal limits. Spleen: The spleen is unremarkable in appearance. Adrenals/Urinary Tract: The adrenal glands are unremarkable in appearance. The kidneys are within normal limits. There is no evidence of hydronephrosis. No renal or ureteral stones are identified. No perinephric stranding is  seen. Stomach/Bowel: The stomach is unremarkable in appearance. The small bowel is within normal limits. The patient is status post appendectomy. The colon is unremarkable in appearance. Vascular/Lymphatic: There is ectasia of the distal abdominal aorta to 2.7 cm in AP dimension, without evidence of aneurysmal dilatation. Scattered calcification is seen along the abdominal aorta and its branches. Mildly prominent retroperitoneal nodes, measuring up to 8 mm in short axis, are nonspecific but may reflect metastatic disease. No pelvic sidewall lymphadenopathy is seen. Reproductive: The bladder is moderately distended and grossly unremarkable. The prostate remains normal in size. Other: No additional soft tissue abnormalities are seen. Musculoskeletal: No acute osseous abnormalities are identified. The visualized musculature is unremarkable in appearance. IMPRESSION: 1. Very large left-sided pleural effusion occupies most of the left hemithorax, with dense consolidation of much of the left lung. Underlying mass cannot be excluded. 2. Diffusely infiltrating mass along the left side of the mediastinum, measuring 3.6 cm adjacent to the superior mediastinum, and extending about much of the pleura of the left hemithorax, particularly medially, inferiorly and posteriorly. 3. Underlying  mediastinal lymphadenopathy measures up to 1.7 cm, compatible with metastatic disease. 4. 2.1 cm nodular opacity at the right lung base is nonspecific but could reflect metastatic disease. 5. Scattered nodules about the left epicardial fat pad, reflecting metastatic disease. 6. Scattered blebs at the lung apices. 7. Mildly prominent retroperitoneal nodes, measuring up to 8 mm in short axis, are nonspecific but could reflect metastatic disease. 8. Scattered aortic atherosclerosis. Ectatic abdominal aorta at risk for aneurysm development. Recommend followup by ultrasound in 5 years. This recommendation follows ACR consensus guidelines: White Paper of the ACR Incidental Findings Committee II on Vascular Findings. J Am Coll Radiol 2013; 10:789-794. Electronically Signed   By: Garald Balding M.D.   On: 12/18/2016 18:28   Mr Jeri Cos XF Contrast  Result Date: 12/18/2016 CLINICAL DATA:  Lung cancer.  Evaluation for metastatic disease. EXAM: MRI HEAD WITHOUT AND WITH CONTRAST TECHNIQUE: Multiplanar, multiecho pulse sequences of the brain and surrounding structures were obtained without and with intravenous contrast. CONTRAST:  4m MULTIHANCE GADOBENATE DIMEGLUMINE 529 MG/ML IV SOLN COMPARISON:  Brain MRI 11/14/2005 FINDINGS: Brain: No focal diffusion restriction to indicate acute infarct. No intraparenchymal hemorrhage. There is a punctate old infarct in the left cerebellum and a small, old lacunar infarct of the right basal ganglia. There is mild multifocal hyperintense T2-weighted signal within the periventricular white matter, most often seen in the setting of chronic microvascular ischemia. No mass lesion or midline shift. No hydrocephalus or extra-axial fluid collection. The midline structures are normal. No age advanced or lobar predominant atrophy. No contrast-enhancing lesions. Vascular: Major intracranial arterial and venous sinus flow voids are preserved. No evidence of chronic microhemorrhage or amyloid  angiopathy. Skull and upper cervical spine: The visualized skull base, calvarium, upper cervical spine and extracranial soft tissues are normal. Sinuses/Orbits: No fluid levels or advanced mucosal thickening. No mastoid effusion. Normal orbits. IMPRESSION: 1. No intracranial metastatic disease. 2. 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 Electronically Signed   By: KUlyses JarredM.D.   On: 12/18/2016 22:07   Ct Abdomen Pelvis W Contrast  Result Date: 12/18/2016 CLINICAL DATA:  Chronic upper abdominal pain under the ribs for 6 weeks. Shortness of breath. Generalized chest and back pain, with difficulty breathing. Initial encounter. EXAM: CT CHEST, ABDOMEN, AND PELVIS WITH CONTRAST TECHNIQUE: Multidetector CT imaging of the chest, abdomen and pelvis was  performed following the standard protocol during bolus administration of intravenous contrast. CONTRAST:  100 mL ISOVUE-300 IOPAMIDOL (ISOVUE-300) INJECTION 61% COMPARISON:  Chest radiograph performed earlier today at 2:04 p.m. FINDINGS: CT CHEST FINDINGS Cardiovascular: The heart remains normal in size. The ascending thoracic aorta is borderline normal in caliber. Minimal calcification is seen along the aortic arch and descending thoracic aorta. The great vessels are grossly unremarkable in appearance. Mediastinum/Nodes: There is diffusely infiltrating mass along the left side of the mediastinum, with associated enlarged nodes measuring up to 1.7 cm at the aortopulmonary window. The mass measures 3.6 cm adjacent to the superior mediastinum, with diffuse pleural extension of disease noted about much of the left hemithorax. No pericardial effusion is identified. The visualized portions of the thyroid gland are unremarkable. No axillary lymphadenopathy is appreciated. Lungs/Pleura: A very large left-sided pleural effusion occupies most of the left hemithorax. The expanded portions of the  left lung are grossly clear. There is dense consolidation of much of the left lung; underlying mass cannot be excluded. Diffuse pleural thickening, most prominent along the mediastinum and along the posterior and inferior aspects of the hemithorax, reflects the patient's lung malignancy. A 2.1 cm nodular opacity at the right lung base is nonspecific but could reflect metastatic disease. Scattered blebs are noted at the lung apices. No pneumothorax is seen. Scattered nodules are noted about the left epicardial fat pad, reflecting metastatic disease. Musculoskeletal: No acute osseous abnormalities are identified. The visualized musculature is unremarkable in appearance. CT ABDOMEN PELVIS FINDINGS Hepatobiliary: The liver is unremarkable in appearance. The gallbladder is unremarkable in appearance. The common bile duct remains normal in caliber. Pancreas: The pancreas is within normal limits. Spleen: The spleen is unremarkable in appearance. Adrenals/Urinary Tract: The adrenal glands are unremarkable in appearance. The kidneys are within normal limits. There is no evidence of hydronephrosis. No renal or ureteral stones are identified. No perinephric stranding is seen. Stomach/Bowel: The stomach is unremarkable in appearance. The small bowel is within normal limits. The patient is status post appendectomy. The colon is unremarkable in appearance. Vascular/Lymphatic: There is ectasia of the distal abdominal aorta to 2.7 cm in AP dimension, without evidence of aneurysmal dilatation. Scattered calcification is seen along the abdominal aorta and its branches. Mildly prominent retroperitoneal nodes, measuring up to 8 mm in short axis, are nonspecific but may reflect metastatic disease. No pelvic sidewall lymphadenopathy is seen. Reproductive: The bladder is moderately distended and grossly unremarkable. The prostate remains normal in size. Other: No additional soft tissue abnormalities are seen. Musculoskeletal: No acute  osseous abnormalities are identified. The visualized musculature is unremarkable in appearance. IMPRESSION: 1. Very large left-sided pleural effusion occupies most of the left hemithorax, with dense consolidation of much of the left lung. Underlying mass cannot be excluded. 2. Diffusely infiltrating mass along the left side of the mediastinum, measuring 3.6 cm adjacent to the superior mediastinum, and extending about much of the pleura of the left hemithorax, particularly medially, inferiorly and posteriorly. 3. Underlying mediastinal lymphadenopathy measures up to 1.7 cm, compatible with metastatic disease. 4. 2.1 cm nodular opacity at the right lung base is nonspecific but could reflect metastatic disease. 5. Scattered nodules about the left epicardial fat pad, reflecting metastatic disease. 6. Scattered blebs at the lung apices. 7. Mildly prominent retroperitoneal nodes, measuring up to 8 mm in short axis, are nonspecific but could reflect metastatic disease. 8. Scattered aortic atherosclerosis. Ectatic abdominal aorta at risk for aneurysm development. Recommend followup by ultrasound in 5 years. This recommendation  follows ACR consensus guidelines: White Paper of the ACR Incidental Findings Committee II on Vascular Findings. J Am Coll Radiol 2013; 10:789-794. Electronically Signed   By: Garald Balding M.D.   On: 12/18/2016 18:28   US Thoracentesis Asp Pleural Space W/img Guide  Result Date: 12/19/2016 INDICATION: Left-sided chest mass. Recurrent large left pleural effusion. Request diagnostic and therapeutic thoracentesis. EXAM: ULTRASOUND GUIDED LEFT THORACENTESIS MEDICATIONS: None. COMPLICATIONS: None immediate. Postprocedural chest x-ray negative for pneumothorax. PROCEDURE: An ultrasound guided thoracentesis was thoroughly discussed with the patient and questions answered. The benefits, risks, alternatives and complications were also discussed. The patient understands and wishes to proceed with the  procedure. Written consent was obtained. Ultrasound was performed to localize and mark an adequate pocket of fluid in the left chest. The area was then prepped and draped in the normal sterile fashion. 1% Lidocaine was used for local anesthesia. Under ultrasound guidance a Safe-T-Centesis catheter was introduced. Thoracentesis was performed. The catheter was removed and a dressing applied. FINDINGS: A total of approximately 1.1 L of hazy, blood-tinged fluid was removed. Patient developed significant discomfort and cough at this point. The procedure was terminated. Samples were sent to the laboratory as requested by the clinical team. IMPRESSION: Successful ultrasound guided left thoracentesis yielding 1.1 L of pleural fluid. Read by: Ascencion Dike PA-C Electronically Signed   By: Marybelle Killings M.D.   On: 12/19/2016 09:58     I have independently reviewed the above radiologic studies.  Recent Lab Findings: Lab Results  Component Value Date   WBC 7.0 12/19/2016   HGB 10.5 (L) 12/19/2016   HCT 32.3 (L) 12/19/2016   PLT 320 12/19/2016   GLUCOSE 82 12/19/2016   CHOL  02/23/2011    130        ATP III CLASSIFICATION:  <200     mg/dL   Desirable  200-239  mg/dL   Borderline High  >=240    mg/dL   High          TRIG 123 02/23/2011   HDL 26 (L) 02/23/2011   LDLCALC  02/23/2011    79        Total Cholesterol/HDL:CHD Risk Coronary Heart Disease Risk Table                     Men   Women  1/2 Average Risk   3.4   3.3  Average Risk       5.0   4.4  2 X Average Risk   9.6   7.1  3 X Average Risk  23.4   11.0        Use the calculated Patient Ratio above and the CHD Risk Table to determine the patient's CHD Risk.        ATP III CLASSIFICATION (LDL):  <100     mg/dL   Optimal  100-129  mg/dL   Near or Above                    Optimal  130-159  mg/dL   Borderline  160-189  mg/dL   High  >190     mg/dL   Very High   ALT 10 (L) 12/19/2016   AST 19 12/19/2016   NA 134 (L) 12/19/2016   K 4.3  12/19/2016   CL 101 12/19/2016   CREATININE 0.72 12/19/2016   BUN 10 12/19/2016   CO2 23 12/19/2016   INR 1.17 12/11/2016   Assessment / Plan:  1. Large Recurrent Left Pleural Effusion, with mass like appearance of left apex and multiple areas concerning for metastatic lung disease--- previous thoracentesis + for malignant cells 2. Plan: patient with need VATS with pleural biospy, possible placement of pleur-x catheter, tentatively for tomorrow afternoon  The goals risks and alternatives of the planned surgical procedure Bronchoscopy ,Left VATS pleural biopsy placement of Pleurx and possible talc pleurodesis.  have been discussed with the patient in detail. The risks of the procedure including death, infection, stroke, myocardial infarction, bleeding, blood transfusion have all been discussed specifically.  I have quoted Joshua White a 5 % of perioperative mortality and a complication rate as high as 30 %. The patient's questions have been answered.Joshua White is willing  to proceed with the planned procedure.   I  spent 40 minutes counseling the patient face to face and 50% or more the  time was spent in counseling and coordination of care. The total time spent in the appointment was 60 minutes.  Grace Isaac MD      [address] [place] [address] Office [phone]   [place]

## 2016-12-20 NOTE — Anesthesia Procedure Notes (Addendum)
Procedure Name: Intubation Date/Time: 12/20/2016 2:04 PM Performed by: Everlean Cherry A Pre-anesthesia Checklist: Patient identified, Emergency Drugs available, Suction available and Patient being monitored Patient Re-evaluated:Patient Re-evaluated prior to inductionOxygen Delivery Method: Circle system utilized Preoxygenation: Pre-oxygenation with 100% oxygen Intubation Type: IV induction Ventilation: Mask ventilation without difficulty Laryngoscope Size: Mac and 4 Grade View: Grade III Tube type: Oral Endobronchial tube: Left, Double lumen EBT, EBT position confirmed by auscultation and EBT position confirmed by fiberoptic bronchoscope and 41 Fr Number of attempts: 1 Airway Equipment and Method: Fiberoptic brochoscope and Stylet Placement Confirmation: ETT inserted through vocal cords under direct vision,  positive ETCO2 and breath sounds checked- equal and bilateral Secured at: 33 cm Tube secured with: Tape Dental Injury: Teeth and Oropharynx as per pre-operative assessment  Comments: Intubated with 8.5 ETT initially then used tube exchanger to switch to double lumen tube size 41.  Dr. Ola Spurr present.  DLT position verified via bronchoscope.

## 2016-12-20 NOTE — Anesthesia Preprocedure Evaluation (Addendum)
Anesthesia Evaluation  Patient identified by MRN, date of birth, ID band Patient awake    Reviewed: Allergy & Precautions, H&P , NPO status , Patient's Chart, lab work & pertinent test results  Airway Mallampati: II  TM Distance: >3 FB Neck ROM: Full    Dental no notable dental hx. (+) Partial Upper, Partial Lower, Dental Advisory Given   Pulmonary neg pulmonary ROS, former smoker,    Pulmonary exam normal breath sounds clear to auscultation       Cardiovascular negative cardio ROS   Rhythm:Regular Rate:Normal     Neuro/Psych Seizures -, Well Controlled,  negative neurological ROS  negative psych ROS   GI/Hepatic negative GI ROS, Neg liver ROS,   Endo/Other  negative endocrine ROS  Renal/GU negative Renal ROS  negative genitourinary   Musculoskeletal  (+) Arthritis , Osteoarthritis,    Abdominal   Peds  Hematology negative hematology ROS (+)   Anesthesia Other Findings   Reproductive/Obstetrics negative OB ROS                            Anesthesia Physical Anesthesia Plan  ASA: II  Anesthesia Plan: General   Post-op Pain Management:    Induction: Intravenous  Airway Management Planned: Double Lumen EBT  Additional Equipment: Arterial line and CVP  Intra-op Plan:   Post-operative Plan: Extubation in OR and Possible Post-op intubation/ventilation  Informed Consent: I have reviewed the patients History and Physical, chart, labs and discussed the procedure including the risks, benefits and alternatives for the proposed anesthesia with the patient or authorized representative who has indicated his/her understanding and acceptance.   Dental advisory given  Plan Discussed with: CRNA  Anesthesia Plan Comments:         Anesthesia Quick Evaluation

## 2016-12-20 NOTE — Progress Notes (Signed)
   Subjective: No acute events over night. Patient says he is feeling better and breathing better today. He met with several physicians yesterday. He knows he will have surgery today and is ready to "get it done". He had no new complaints or concerns this morning.   Objective:  Vital signs in last 24 hours: Vitals:   12/19/16 0848 12/19/16 1019 12/19/16 2031 12/20/16 0452  BP:  (!) 145/77 140/87 134/85  Pulse:  77 89 90  Resp:   (!) 28 18  Temp: 99.2 F (37.3 C)  99.8 F (37.7 C) 98.2 F (36.8 C)  TempSrc: Oral  Oral Oral  SpO2: 95% 97% 96% 92%  Weight:    133 lb 11.2 oz (60.6 kg)  Height:       Physical Exam  Constitutional: He is oriented to person, place, and time.  Cachectic  HENT:  Head: Normocephalic and atraumatic.  Cardiovascular: Normal rate and regular rhythm.   Respiratory: Effort normal.  Diminished breath sounds on the left compared with the right.  GI: Soft. Bowel sounds are normal. He exhibits no distension. There is no tenderness.  Musculoskeletal: He exhibits no edema.  Neurological: He is alert and oriented to person, place, and time.     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  # Possible metastatic disease ## Left Pleural Effusion Plan for surgery today via VATS with pleural biopsy and potential pleur-x. Unclear primary at this point. Favor primary lung vs mesothelioma. Will follow-up with pathology from biopsy. Will also follow-up with oncology recs.  -- Follow-up cytology from thoracentesis -- Follow-up medical oncology recommendations -- Follow-up  cardiovascular and thoracic surgery recommendations -- Norco one tablet every 6 hours when necessary pain  # Hepatitis C virus  Patient has a history of hepatitis C virus. He is currently undergoing curative treatment. -- Continue treatment  DVT/PE prophylaxis: Lovenox FEN/GI: NPO until after surgery  Code: Full code   Dispo: Anticipated discharge pending clinical course.  Ophelia Shoulder, MD 12/20/2016, 9:06 AM Pager: [phone]

## 2016-12-20 NOTE — Anesthesia Postprocedure Evaluation (Signed)
Anesthesia Post Note  Patient: Joshua White  Procedure(s) Performed: Procedure(s) (LRB): VIDEO BRONCHOSCOPY (N/A) VIDEO ASSISTED THORACOSCOPY (VATS) W/TALC PLEURADESIS (Left) PLEURAL BIOPSY (Left) INSERTION PLEURAL DRAINAGE CATHETER (Left)  Patient location during evaluation: PACU Anesthesia Type: General Level of consciousness: awake and alert Pain management: pain level controlled Vital Signs Assessment: post-procedure vital signs reviewed and stable Respiratory status: spontaneous breathing, nonlabored ventilation, respiratory function stable and patient connected to nasal cannula oxygen Cardiovascular status: blood pressure returned to baseline and stable Postop Assessment: no signs of nausea or vomiting Anesthetic complications: no       Last Vitals:  Vitals:   12/20/16 1715 12/20/16 1730  BP: (!) 147/85   Pulse: 84 85  Resp: (!) 24 (!) 23  Temp:      Last Pain:  Vitals:   12/20/16 1730  TempSrc:   PainSc: [name]

## 2016-12-20 NOTE — Brief Op Note (Addendum)
      [address]       [place] [address]             [phone]      12/20/2016  3:48 PM  PATIENT:  Joshua White  66 y.o. male  PRE-OPERATIVE DIAGNOSIS:  Large large pleural effusion   POST-OPERATIVE DIAGNOSIS:  Large large pleural effusion / Pleural bx - malignant   PROCEDURE:  VIDEO BRONCHOSCOPY , LEFT  VIDEO ASSISTED THORACOSCOPY (VATS) W/TALC PLEURADESIS  MULTIPLE LEFT PLEURAL BIOPSIES, INSERTION LEFT PLEUR X CATHETER   FINDINGS: 500 cc of left pleural fluid removed  SURGEON:  Surgeon(s) and Role:    * Grace Isaac, MD - Primary  PHYSICIAN ASSISTANTS: Lars Pinks PA-C and Jadene Pierini PA-C  ANESTHESIA:   general  EBL:  Total I/O In: 1280 [P.O.:180; I.V.:1100] Out: 730 [Urine:130; Other:500; Blood:100]  BLOOD ADMINISTERED:none  DRAINS: 28 chest tube placed in the left pleural space  plurix cath also placed   SPECIMEN:  Source of Specimen:  Multiple pleural biospsies and pleural peel  DISPOSITION OF SPECIMEN:  PATHOLOGY. Frozen section of pleural biopsy is malignant.  COUNTS CORRECT:  YES  DICTATION: .Dragon Dictation  PLAN OF CARE: Admit to inpatient   PATIENT DISPOSITION:  PACU - hemodynamically stable.   Delay start of Pharmacological VTE agent (>24hrs) due to surgical blood loss or risk of bleeding: yes

## 2016-12-20 NOTE — Anesthesia Procedure Notes (Signed)
Central Venous Catheter Insertion Performed by: Roderic Palau, anesthesiologist Start/End2/21/2018 11:26 AM, 12/20/2016 11:36 AM Patient location: Pre-op. Preanesthetic checklist: patient identified, IV checked, site marked, risks and benefits discussed, surgical consent, monitors and equipment checked, pre-op evaluation, timeout performed and anesthesia consent Position: Trendelenburg Lidocaine 1% used for infiltration and patient sedated Hand hygiene performed , maximum sterile barriers used  and Seldinger technique used Catheter size: 8 Fr Total catheter length 16. Central line was placed.Double lumen Procedure performed without using ultrasound guided technique. Attempts: 1 Following insertion, dressing applied, line sutured and Biopatch. Post procedure assessment: blood return through all ports  Patient tolerated the procedure well with no immediate complications.

## 2016-12-20 NOTE — Interval H&P Note (Signed)
Preop  Interval Progress Note:  12/20/2016 12:48 PM  Joshua White  has presented today for surgery, with the diagnosis of LUNG CANCER PLEURAL EFFUSION  The various methods of treatment have been discussed with the patient and family. After consideration of risks, benefits and other options for treatment, the patient has consented to  Procedure(s): VIDEO BRONCHOSCOPY (N/A) VIDEO ASSISTED THORACOSCOPY (VATS) W/TALC PLEURADESIS (Left) PLEURAL BIOPSY (Left) INSERTION PLEURAL DRAINAGE CATHETER (Left) as a surgical intervention .  The patient's history has been reviewed, patient examined, no change in status, stable for surgery.  I have reviewed the patient's chart and labs.  Questions were answered to the patient's satisfaction.    Lab Results  Component Value Date   WBC 6.6 12/19/2016   HGB 11.1 (L) 12/19/2016   HCT 33.2 (L) 12/19/2016   PLT 354 12/19/2016   GLUCOSE 128 (H) 12/19/2016   CHOL  02/23/2011    130        ATP III CLASSIFICATION:  <200     mg/dL   Desirable  200-239  mg/dL   Borderline High  >=240    mg/dL   High          TRIG 123 02/23/2011   HDL 26 (L) 02/23/2011   LDLCALC  02/23/2011    79        Total Cholesterol/HDL:CHD Risk Coronary Heart Disease Risk Table                     Men   Women  1/2 Average Risk   3.4   3.3  Average Risk       5.0   4.4  2 X Average Risk   9.6   7.1  3 X Average Risk  23.4   11.0        Use the calculated Patient Ratio above and the CHD Risk Table to determine the patient's CHD Risk.        ATP III CLASSIFICATION (LDL):  <100     mg/dL   Optimal  100-129  mg/dL   Near or Above                    Optimal  130-159  mg/dL   Borderline  160-189  mg/dL   High  >190     mg/dL   Very High   ALT 10 (L) 12/19/2016   AST 18 12/19/2016   NA 136 12/19/2016   K 4.0 12/19/2016   CL 101 12/19/2016   CREATININE 0.81 12/19/2016   BUN 15 12/19/2016   CO2 25 12/19/2016   INR 1.16 12/19/2016   Grace Isaac

## 2016-12-20 NOTE — Transfer of Care (Signed)
Immediate Anesthesia Transfer of Care Note  Patient: Joshua White  Procedure(s) Performed: Procedure(s): VIDEO BRONCHOSCOPY (N/A) VIDEO ASSISTED THORACOSCOPY (VATS) W/TALC PLEURADESIS (Left) PLEURAL BIOPSY (Left) INSERTION PLEURAL DRAINAGE CATHETER (Left)  Patient Location: PACU  Anesthesia Type:General  Level of Consciousness: awake, alert , oriented and patient cooperative  Airway & Oxygen Therapy: Patient Spontanous Breathing and Patient connected to face mask oxygen  Post-op Assessment: Report given to RN and Post -op Vital signs reviewed and stable  Post vital signs: Reviewed and stable  Last Vitals:  Vitals:   12/19/16 2031 12/20/16 0452  BP: 140/87 134/85  Pulse: 89 90  Resp: (!) 28 18  Temp: 37.7 C 36.8 C    Last Pain:  Vitals:   12/20/16 0711  TempSrc:   PainSc: 7          Complications: No apparent anesthesia complications

## 2016-12-21 ENCOUNTER — Inpatient Hospital Stay (HOSPITAL_COMMUNITY): Payer: Medicare Other

## 2016-12-21 ENCOUNTER — Encounter (HOSPITAL_COMMUNITY): Payer: Self-pay | Admitting: Cardiothoracic Surgery

## 2016-12-21 DIAGNOSIS — Z9689 Presence of other specified functional implants: Secondary | ICD-10-CM

## 2016-12-21 LAB — BASIC METABOLIC PANEL
ANION GAP: 9 (ref 5–15)
BUN: 13 mg/dL (ref 6–20)
CALCIUM: 9.8 mg/dL (ref 8.9–10.3)
CO2: 26 mmol/L (ref 22–32)
CREATININE: 0.58 mg/dL — AB (ref 0.61–1.24)
Chloride: 98 mmol/L — ABNORMAL LOW (ref 101–111)
Glucose, Bld: 134 mg/dL — ABNORMAL HIGH (ref 65–99)
Potassium: 4.7 mmol/L (ref 3.5–5.1)
Sodium: 133 mmol/L — ABNORMAL LOW (ref 135–145)

## 2016-12-21 LAB — POCT I-STAT 3, ART BLOOD GAS (G3+)
Acid-Base Excess: 3 mmol/L — ABNORMAL HIGH (ref 0.0–2.0)
Bicarbonate: 28.7 mmol/L — ABNORMAL HIGH (ref 20.0–28.0)
O2 SAT: 99 %
PCO2 ART: 45.1 mmHg (ref 32.0–48.0)
PO2 ART: 118 mmHg — AB (ref 83.0–108.0)
Patient temperature: 97.9
TCO2: 30 mmol/L (ref 0–100)
pH, Arterial: 7.411 (ref 7.350–7.450)

## 2016-12-21 LAB — GLUCOSE, CAPILLARY
GLUCOSE-CAPILLARY: 90 mg/dL (ref 65–99)
GLUCOSE-CAPILLARY: 95 mg/dL (ref 65–99)
Glucose-Capillary: 140 mg/dL — ABNORMAL HIGH (ref 65–99)
Glucose-Capillary: 109 mg/dL — ABNORMAL HIGH (ref 65–99)

## 2016-12-21 LAB — CBC
HCT: 32.9 % — ABNORMAL LOW (ref 39.0–52.0)
Hemoglobin: 10.7 g/dL — ABNORMAL LOW (ref 13.0–17.0)
MCH: 31.8 pg (ref 26.0–34.0)
MCHC: 32.5 g/dL (ref 30.0–36.0)
MCV: 97.9 fL (ref 78.0–100.0)
PLATELETS: 358 10*3/uL (ref 150–400)
RBC: 3.36 MIL/uL — ABNORMAL LOW (ref 4.22–5.81)
RDW: 12.9 % (ref 11.5–15.5)
WBC: 10.3 10*3/uL (ref 4.0–10.5)

## 2016-12-21 MED ORDER — CHLORHEXIDINE GLUCONATE CLOTH 2 % EX PADS
6.0000 | MEDICATED_PAD | Freq: Every day | CUTANEOUS | Status: DC
  Administered 2016-12-21 – 2016-12-26 (×6): 6 via TOPICAL

## 2016-12-21 MED ORDER — SODIUM CHLORIDE 0.9% FLUSH
10.0000 mL | Freq: Two times a day (BID) | INTRAVENOUS | Status: DC
  Administered 2016-12-21 (×2): 10 mL
  Administered 2016-12-22 – 2016-12-23 (×2): 20 mL
  Administered 2016-12-23 – 2016-12-24 (×3): 10 mL
  Administered 2016-12-24: 30 mL
  Administered 2016-12-25 (×2): 10 mL

## 2016-12-21 MED ORDER — INSULIN ASPART 100 UNIT/ML ~~LOC~~ SOLN: 0.0000 [IU] | Freq: Three times a day (TID) | SUBCUTANEOUS | Status: DC

## 2016-12-21 MED ORDER — SODIUM CHLORIDE 0.9% FLUSH: 10.0000 mL | INTRAVENOUS | Status: DC | PRN

## 2016-12-21 NOTE — Progress Notes (Addendum)
[facility] DAILY ICU PROGRESS NOTE                   [address]            [place] [address]          [phone]   1 Day Post-Op Procedure(s) (LRB): VIDEO BRONCHOSCOPY (N/A) VIDEO ASSISTED THORACOSCOPY (VATS) W/TALC PLEURADESIS (Left) PLEURAL BIOPSY (Left) INSERTION PLEURAL DRAINAGE CATHETER (Left)  Total Length of Stay:  LOS: 2 days   Subjective: Patient sitting in chair. He is hungry.  Objective: Vital signs in last 24 hours: Temp:  [97.5 F (36.4 C)-98.5 F (36.9 C)] 97.9 F (36.6 C) (02/22 0400) Pulse Rate:  [73-151] 73 (02/22 0700) Cardiac Rhythm: Normal sinus rhythm (02/22 0400) Resp:  [11-26] 24 (02/22 0700) BP: (113-156)/(70-101) 145/84 (02/22 0700) SpO2:  [93 %-100 %] 100 % (02/22 0700) Arterial Line BP: (133-187)/(64-81) 170/77 (02/22 0700) Weight:  [134 lb 11.2 oz (61.1 kg)] 134 lb 11.2 oz (61.1 kg) (02/22 0600)  Filed Weights   12/19/16 0500 12/20/16 0452 12/21/16 0600  Weight: 139 lb 11.2 oz (63.4 kg) 133 lb 11.2 oz (60.6 kg) 134 lb 11.2 oz (61.1 kg)    Weight change: 1 lb (0.454 kg)      Intake/Output from previous day: 02/21 0701 - 02/22 0700 In: 2948 [P.O.:417; I.V.:2431; IV Piggyback:100] Out: 9390 [Urine:815; Blood:100; Chest Tube:380]  Intake/Output this shift: No intake/output data recorded.  Current Meds: Scheduled Meds: . acetaminophen  1,000 mg Oral Q6H   Or  . acetaminophen (TYLENOL) oral liquid 160 mg/5 mL  1,000 mg Oral Q6H  . bisacodyl  10 mg Oral Daily  . cefUROXime (ZINACEF)  IV  1.5 g Intravenous Q12H  . Chlorhexidine Gluconate Cloth  6 each Topical Daily  . enoxaparin (LOVENOX) injection  30 mg Subcutaneous Q24H  . feeding supplement (ENSURE ENLIVE)  237 mL Oral TID BM  . fentaNYL   Intravenous Q4H  . insulin aspart  0-24 Units Subcutaneous Q6H  . mouth rinse  15 mL Mouth Rinse BID  . pantoprazole  40 mg Oral Daily  . senna-docusate  1 tablet Oral QHS  . sodium chloride flush  10-40 mL Intracatheter Q12H    Continuous Infusions: . dextrose 5 % and 0.9 % NaCl with KCl 40 mEq/L 100 mL/hr at 12/21/16 0700   PRN Meds:.diphenhydrAMINE **OR** diphenhydrAMINE, HYDROcodone-acetaminophen, naloxone **AND** sodium chloride flush, ondansetron **OR** ondansetron (ZOFRAN) IV, potassium chloride (KCL MULTIRUN) 30 mEq in 265 mL IVPB, sodium chloride flush, traMADol  General appearance: alert, cooperative and no distress Neurologic: intact Heart: RRR Lungs: Coarse breath sounds on the left and clearer on right Abdomen: soft, non-tender; bowel sounds normal; no masses,  no organomegaly Extremities: No LE edema Wound: Dressing is clean and dry  Lab Results: CBC: Recent Labs  12/19/16 1854 12/21/16 0400  WBC 6.6 10.3  HGB 11.1* 10.7*  HCT 33.2* 32.9*  PLT 354 358   BMET:  Recent Labs  12/19/16 1854 12/21/16 0400  NA 136 133*  K 4.0 4.7  CL 101 98*  CO2 25 26  GLUCOSE 128* 134*  BUN 15 13  CREATININE 0.81 0.58*  CALCIUM 9.8 9.8    CMET: Lab Results  Component Value Date   WBC 10.3 12/21/2016   HGB 10.7 (L) 12/21/2016   HCT 32.9 (L) 12/21/2016   PLT 358 12/21/2016   GLUCOSE 134 (H) 12/21/2016   CHOL  02/23/2011    130  ATP III CLASSIFICATION:  <200     mg/dL   Desirable  200-239  mg/dL   Borderline High  >=240    mg/dL   High          TRIG 123 02/23/2011   HDL 26 (L) 02/23/2011   LDLCALC  02/23/2011    79        Total Cholesterol/HDL:CHD Risk Coronary Heart Disease Risk Table                     Men   Women  1/2 Average Risk   3.4   3.3  Average Risk       5.0   4.4  2 X Average Risk   9.6   7.1  3 X Average Risk  23.4   11.0        Use the calculated Patient Ratio above and the CHD Risk Table to determine the patient's CHD Risk.        ATP III CLASSIFICATION (LDL):  <100     mg/dL   Optimal  100-129  mg/dL   Near or Above                    Optimal  130-159  mg/dL   Borderline  160-189  mg/dL   High  >190     mg/dL   Very High   ALT 10 (L) 12/19/2016    AST 18 12/19/2016   NA 133 (L) 12/21/2016   K 4.7 12/21/2016   CL 98 (L) 12/21/2016   CREATININE 0.58 (L) 12/21/2016   BUN 13 12/21/2016   CO2 26 12/21/2016   INR 1.16 12/19/2016      PT/INR:  Recent Labs  12/19/16 1854  LABPROT 14.9  INR 1.16   Radiology: Dg Chest Port 1 View  Result Date: 12/21/2016 CLINICAL DATA:  Status post thoracoscopy with pleurodesis and pleural biopsy and chest tube placement on the left on 20 December 2016. EXAM: PORTABLE CHEST 1 VIEW COMPARISON:  Portable chest x-ray of December 20, 2016 FINDINGS: The left-sided pneumothorax has decreased in volume but a small amount remains along the left mid and lower hemithorax laterally. The left chest tube tip is stable lying in the medial aspect of the interspace between the posterior portions of the fourth and fifth ribs. Vertically-oriented parenchymal density persists in the periphery of the left mid lung. There is a small amount of pleural fluid on the left. The right lung is clear. The heart is normal in size. The pulmonary vascularity is not engorged. The right subclavian venous catheter tip projects over the proximal SVC. IMPRESSION: Decreased volume of the left pneumothorax. Persistent parenchymal density peripherally in the left mid lung adjacent to the pleural surface. Probable trace left pleural effusion. The left chest tube is in stable position. No pneumonia on the right nor CHF. Electronically Signed   By: David  Martinique M.D.   On: 12/21/2016 07:30   Dg Chest Port 1 View  Result Date: 12/20/2016 CLINICAL DATA:  Pneumothorax, left-sided chest pain EXAM: PORTABLE CHEST 1 VIEW COMPARISON:  Chest x-ray of 12/19/2016 FINDINGS: After evacuation of a left pleural effusion there is a left pneumothorax, possibly ex vacuo. A left chest tube is present. Some left lung opacity is noted peripherally possibly due to atelectasis or re- expansion. The right lung is clear. Mild cardiomegaly is stable. Left central venous line  tip overlies the upper SVC IMPRESSION: 1. Left pneumothorax after evacuation of the left  pleural effusion, possibly ex vacuo well. Left chest tube is present. 2. Parenchymal opacity in the left mid lung peripherally possibly due to atelectasis or re-expansion. Electronically Signed   By: Ivar Drape M.D.   On: 12/20/2016 17:10     Assessment/Plan: S/P Procedure(s) (LRB): VIDEO BRONCHOSCOPY (N/A) VIDEO ASSISTED THORACOSCOPY (VATS) W/TALC PLEURADESIS (Left) PLEURAL BIOPSY (Left) INSERTION PLEURAL DRAINAGE CATHETER (Left)  1. CV- SR in the 70's. Monitor BP. He was not on an antihypertensive prior to surgery. 2. Pulmonary-On 2 liters of oxygen via [place]. Will wean over next several days. Chest tube with 380 cc since surgery. Chest tube to suction. There is no air leak. CXR this am shows decreased volume of the left pneumothorax, small left pleural effusion, and density peripherally in the left mid lung adjacent to pleural surface. Hope to place chest tube to water seal soon. Encourage incentive spirometer. Await final pathology. 3. Anemia- H and H 10.7 and 32.9 4. Hepatitis C 5. Remove a line, decrease IVF    ZIMMERMAN,DONIELLE M PA-C 12/21/2016 7:39 AM   continue chest tube another day, start pleurix drainage when chest tube out Final path pending To stepdown I have seen and examined [name] and agree with the above assessment  and plan.  Grace Isaac MD [facility] [phone] Office [phone] 12/21/2016 8:11 AM

## 2016-12-21 NOTE — Care Management Note (Signed)
Case Management Note  Patient Details  Name: Joshua White MRN: [id number] Date of Birth: Mar 17, 1951  Subjective/Objective:    POD #1 s/p VATS, pleural Bx, Pleural X catheter and chest tube placement.  Pt lives alone but reports that his son is currently living with him - son works during the day.  CM will follow for d/c needs.                        Expected Discharge Plan:  [place]  CM Consult  Status of Service:  In process, will continue to follow  Girard Cooter, RN 12/21/2016, 3:19 PM

## 2016-12-21 NOTE — Progress Notes (Signed)
   Subjective: No acute events over night.POD #1 s/p VATS, pleural Bx, Pleural X catheter and chest tube placement. Doing well. Says he feels much better and that he is breathing better. Pain well controlled. Passing gas. No BM  Objective:  Vital signs in last 24 hours: Vitals:   12/21/16 0700 12/21/16 0738 12/21/16 0800 12/21/16 0814  BP: (!) 145/84  137/84   Pulse: 73  83   Resp: (!) '24 20 18   '$ Temp:    97.3 F (36.3 C)  TempSrc:    Oral  SpO2: 100% 99% 100%   Weight:      Height:       Physical Exam  Constitutional: He is oriented to person, place, and time.  Cachectic  HENT:  Head: Normocephalic and atraumatic.  Cardiovascular: Normal rate and regular rhythm.   Respiratory: Effort normal.  Improved breath sounds bilaterally. Chest tube, Pleur-x and Arterial line present on the left.   GI: Soft. Bowel sounds are normal. He exhibits no distension. There is no tenderness.  Musculoskeletal: He exhibits edema.  Mild bilateral pedal edma  Neurological: He is alert and oriented to person, place, and time.     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  # Possible metastatic disease s/p VATS, Pleural Bx, Pleural X cath and Chest tube placement ## Left Pleural Effusion POD #1. Afebrile, HDS. Breathing improved. Pain well controlled. Hg stable, no leukocytosis.  -- Follow-up cytology from thoracentesis- No malignant cells identified. Fluid from 12/11/2016 shows atypical cells present concerning for  malignancy  -- Follow-up medical oncology recommendations -- Follow-up cardiovascular and thoracic surgery recommendations -- F/u Bx results -- Fentanyl PCA, Pain well managed  # Hepatitis C virus  Patient has a history of hepatitis C virus. He is currently undergoing curative treatment. -- Continue treatment  DVT/PE prophylaxis: Lovenox  FEN/GI: NPO until after surgery  Code: Full code   Dispo: Anticipated discharge pending clinical course.  Ophelia Shoulder, MD 12/21/2016, 9:15 AM Pager: [phone]

## 2016-12-22 ENCOUNTER — Inpatient Hospital Stay (HOSPITAL_COMMUNITY): Payer: Medicare Other

## 2016-12-22 LAB — COMPREHENSIVE METABOLIC PANEL
ALBUMIN: 2.3 g/dL — AB (ref 3.5–5.0)
ALT: 10 U/L — ABNORMAL LOW (ref 17–63)
AST: 19 U/L (ref 15–41)
Alkaline Phosphatase: 53 U/L (ref 38–126)
Anion gap: 6 (ref 5–15)
BILIRUBIN TOTAL: 0.8 mg/dL (ref 0.3–1.2)
BUN: 10 mg/dL (ref 6–20)
CO2: 26 mmol/L (ref 22–32)
Calcium: 9.1 mg/dL (ref 8.9–10.3)
Chloride: 101 mmol/L (ref 101–111)
Creatinine, Ser: 0.7 mg/dL (ref 0.61–1.24)
GFR calc Af Amer: >60 mL/min (ref >60)
GFR calc non Af Amer: >60 mL/min (ref >60)
Glucose, Bld: 94 mg/dL (ref 65–99)
POTASSIUM: 4.5 mmol/L (ref 3.5–5.1)
Sodium: 133 mmol/L — ABNORMAL LOW (ref 135–145)
TOTAL PROTEIN: 7.1 g/dL (ref 6.5–8.1)

## 2016-12-22 LAB — CBC
HEMATOCRIT: 31.2 % — AB (ref 39.0–52.0)
Hemoglobin: 10.1 g/dL — ABNORMAL LOW (ref 13.0–17.0)
MCH: 31.6 pg (ref 26.0–34.0)
MCHC: 32.4 g/dL (ref 30.0–36.0)
MCV: 97.5 fL (ref 78.0–100.0)
Platelets: 308 10*3/uL (ref 150–400)
RBC: 3.2 MIL/uL — ABNORMAL LOW (ref 4.22–5.81)
RDW: 12.6 % (ref 11.5–15.5)
WBC: 7.5 10*3/uL (ref 4.0–10.5)

## 2016-12-22 LAB — GLUCOSE, CAPILLARY
GLUCOSE-CAPILLARY: 105 mg/dL — AB (ref 65–99)
GLUCOSE-CAPILLARY: 109 mg/dL — AB (ref 65–99)
Glucose-Capillary: 100 mg/dL — ABNORMAL HIGH (ref 65–99)
Glucose-Capillary: 87 mg/dL (ref 65–99)
Glucose-Capillary: 89 mg/dL (ref 65–99)

## 2016-12-22 NOTE — Progress Notes (Signed)
   Subjective: No acute events over night.POD #2 s/p VATS, pleural Bx, Pleural X catheter and chest tube placement. Doing well. Says he feels much better and that he is breathing better. Pain well controlled. Passing gas. No BM. Says he is feeling better. On physical exam the patient looks better than when we admitted him in the emergency department. He did have some pain overnight but again says it's well-controlled with his PCA. He had no additional questions this morning.  Objective:  Vital signs in last 24 hours: Vitals:   12/22/16 0000 12/22/16 0400 12/22/16 0424 12/22/16 0444  BP: 131/83 136/76    Pulse: 79 83 79   Resp: (!) 21 (!) 23 (!) 27   Temp: 98.2 F (36.8 C)  98.6 F (37 C)   TempSrc: Oral  Oral   SpO2: 100% 100% 99%   Weight:    137 lb 5.6 oz (62.3 kg)  Height:       Physical Exam  Constitutional: He is oriented to person, place, and time.  Cachectic  HENT:  Head: Normocephalic and atraumatic.  Cardiovascular: Normal rate and regular rhythm.   Respiratory: Effort normal.  Improved breath sounds bilaterally. Chest tube, Pleur-x and Arterial line present on the left.   GI: Soft. Bowel sounds are normal. He exhibits no distension. There is no tenderness.  Musculoskeletal: He exhibits edema.  Mild bilateral pedal edma  Neurological: He is alert and oriented to person, place, and time.     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  # Possible metastatic disease s/p VATS, Pleural Bx,  Pleurx cath and Chest tube placement ## Left Pleural Effusion POD #2. Afebrile, HDS. Breathing improved. Pain well controlled. Hg stable, no leukocytosis.Chest tube with approximately 80 mL of output over the last 24 hours. Surgery PA was present in the room and discussed that Dr. Pia Mau will determine the status of the patient's chest tube and Pleurx catheter today. We greatly appreciate their recommendations on this case. -- Follow-up cytology from thoracentesis- No malignant cells identified. Fluid from 12/11/2016 shows atypical cells present concerning for malignancy  -- On 2 L of oxygen via nasal cannula, will wean over the next days. -- Per CVTS chest tube to suction -- Follow-up medical oncology recommendations -- Follow-up cardiovascular and thoracic surgery recommendations -- F/u Bx results -- Fentanyl PCA, Pain well managed we'll discontinue PCA following removal of chest tube  # Hepatitis C virus  Patient has a history of hepatitis C virus. He is currently undergoing curative treatment. -- Continue treatment  DVT/PE prophylaxis: Lovenox  FEN/GI: NPO until after surgery  Code: Full code   Dispo: Anticipated discharge pending clinical course.  Ophelia Shoulder, MD 12/22/2016, 7:17 AM Pager: [phone]

## 2016-12-22 NOTE — Care Management Note (Addendum)
Case Management Note  Patient Details  Name: DYLON CORREA MRN: [id number] Date of Birth: 1951/10/06  Subjective/Objective:  POD #1 s/p VATS, pleural Bx, Pleural X catheter and chest tube placement.  Pt lives alone but reports that his son is currently living with him - son works during the day, patient states his son will be able to help with the pleurx drainage.  He chose [facility] for [facility], referral made to [facility] , Soc will begin 24-48 hrs post dc.  Patient has medication coverage and he has a pcp Dr. Ulice Dash at [facility] on [address].  He will have transportation at discharge.  He does not have any DME at home.  NCM informed secretary , Levada Dy to order the pleurx drainage kits to come to patient's room.    NCM asked MD to put [facility] orders in, he put in HHRN/HHPT, patient unable to get HHPT with medicaid, but will be able to get [facility].  NCM will cont to follow for dc needs.    2/26 [place] ,BSN - patient for possible dc today after have BM, getting lactulose, NCM notified Jermaine with [facility] that patient is for possible dc today with pleurx drains.  The box is not in patient's room, Nidia the secretary is going to materials to pick up box . pleurx drains are in patient's room.   NCM will cont to follow for dc needs.    2/27 [place], BSN - patient for dc today, Jermaine with [facility] notified.                                        Action/Plan:   Expected Discharge Date:                  Expected Discharge Plan:  [place]  In-House Referral:     Discharge planning Services  CM Consult  Post Acute Care Choice:  Home Health Choice offered to:  Patient  DME Arranged:    DME Agency:     HH Arranged:  RN [place] Agency:  [facility]  Status of Service:  Completed, signed off  If discussed at [place] of Stay Meetings, dates discussed:    Additional Comments:  Zenon Mayo, RN 12/22/2016, 1:22 PM

## 2016-12-22 NOTE — Progress Notes (Signed)
Offered to ambulate patient.  Patient refused.  Attempted to get patient to chair, and patient still refused stating "I just got comfortable".  RN educated patient on the importance of getting out of the bed even just to the chair.  Patient stated understanding and agreed that he would get up to the chair for meals.

## 2016-12-22 NOTE — Op Note (Signed)
NAMEROYER, CRISTOBAL NO.:  [id number]  MEDICAL RECORD NO.:  [id number]  LOCATION:  3W16C                        FACILITY:  [facility]  PHYSICIAN:  Lanelle Bal, MD    DATE OF BIRTH:  1951/06/12  DATE OF PROCEDURE:  12/20/2016 DATE OF DISCHARGE:                              OPERATIVE REPORT   PREOPERATIVE DIAGNOSIS:  Large left pleural effusion.  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  PROCEDURES PERFORMED:  Bronchoscopy, left video-assisted thoracoscopy, pleural biopsy, drainage of effusion, sterile talc pleurodesis, and placement of PleurX catheter.  SURGEON:  Lanelle Bal, MD  FIRST ASSISTANT:  Lars Pinks, PA  BRIEF HISTORY:  The patient is a 66 year old male with no previous history of malignancy who presents with a large left pleural effusion suggestive of malignancy.  Previous thoracentesis was done, but without diagnostic material.  The patient was seen by Thoracic Surgery, and drainage and biopsy of the pleura were recommended.  The risks and options were discussed with the patient who agreed and signed informed consent.  DESCRIPTION OF PROCEDURE:  The patient was brought to the operating room, underwent general endotracheal anesthesia with a single-lumen endotracheal tube.  Appropriate time-out was performed and we proceeded with the bronchoscopy to the subsegmental level on both left and right tracheobronchial tree without evidence of endobronchial lesions.  The scope was removed.  The patient was turned in lateral decubitus position who had preoperatively been marked.  The left chest was prepped with Betadine and draped in a sterile manner.  A second time-out was performed and then we proceeded with placement of a small port incision in approximately mid axillary line 6th intercostal space.  Through this, a 5-mm 30-degree videoscope was placed into the chest.  There was significant  bloody fluid present with some old clot, possibly related to the previous thoracentesis.  A second port was placed slightly more posterior and superior and through the 2 ports we were able to completely drain the effusion and took multiple pleural biopsies. Frozen section on this showed malignancy.  We then placed 2 g of sterile powder talc, insufflated across the lung surface.  Then, under direct vision, placed a guidewire through a separate site lower on the chest wall and then through over a dilator.  Then passed a PleurX catheter into the chest.  This catheter had been tunneled subcutaneously.  A second #28 chest tube was left in one port site.  The remaining port site was closed with interrupted Vicryl and subcuticular stitch.  The lung was reinflated as much as possible.  Sponge and needle count was reported as correct at the completion of the procedure.  The patient tolerated the procedure without obvious complication.  He was extubated in the operating room.  A total of 800-900 mL of pleural fluid were removed. The patient was transferred to the recovery room having tolerated procedure without complication.     Lanelle Bal, MD     EG/MEDQ  D:  12/22/2016  T:  12/22/2016  Job:  [id number]

## 2016-12-22 NOTE — Progress Notes (Addendum)
      [address]       [place] [address]             [phone]       2 Days Post-Op Procedure(s) (LRB): VIDEO BRONCHOSCOPY (N/A) VIDEO ASSISTED THORACOSCOPY (VATS) W/TALC PLEURADESIS (Left) PLEURAL BIOPSY (Left) INSERTION PLEURAL DRAINAGE CATHETER (Left)  Subjective: Patient awake and alert this am. He states his breathing is better.  Objective: Vital signs in last 24 hours: Temp:  [97.3 F (36.3 C)-98.7 F (37.1 C)] 98.6 F (37 C) (02/23 0424) Pulse Rate:  [58-91] 79 (02/23 0424) Cardiac Rhythm: Normal sinus rhythm (02/23 0700) Resp:  [16-31] 27 (02/23 0424) BP: (128-150)/(76-108) 136/76 (02/23 0400) SpO2:  [96 %-100 %] 99 % (02/23 0424) Arterial Line BP: (163)/(74-79) 163/79 (02/22 0900) FiO2 (%):  [36 %] 36 % (02/22 1600) Weight:  [137 lb 5.6 oz (62.3 kg)] 137 lb 5.6 oz (62.3 kg) (02/23 0444)      Intake/Output from previous day: 02/22 0701 - 02/23 0700 In: 1708.7 [P.O.:597; I.V.:1091.7] Out: 1280 [Urine:1200; Chest Tube:80]   Physical Exam:  Pulmonary: Clear to auscultation bilaterally; no rales, wheezes, or rhonchi. Wounds: Dressing is clean and dry.   Chest Tube: to suction, no air leak  Lab Results: CBC: Recent Labs  12/21/16 0400 12/22/16 0438  WBC 10.3 7.5  HGB 10.7* 10.1*  HCT 32.9* 31.2*  PLT 358 308   BMET:  Recent Labs  12/21/16 0400 12/22/16 0438  NA 133* 133*  K 4.7 4.5  CL 98* 101  CO2 26 26  GLUCOSE 134* 94  BUN 13 10  CREATININE 0.58* 0.70  CALCIUM 9.8 9.1    PT/INR:  Recent Labs  12/19/16 1854  LABPROT 14.9  INR 1.16   ABG:  INR: Will add last result for INR, ABG once components are confirmed Will add last 4 CBG results once components are confirmed  Assessment/Plan:  1. CV - SR this am. 2.  Pulmonary - On room air. Chest tube with 80 cc of output last 24 hours. Likely place to water seal and hope to remove soon. CXR this am appears stable. Encourage incentive spirometer.  3. CBGs  109/105/109. Will stop accu checks and SS PRN as no prior history of diabetes. 4. Anemia-H and H stable at 10.1 and 31.2 5. Will stop PCA once chest tube removed  White,Joshua MPA-C 12/22/2016,7:38 AM  Ct to water seal, poss remove in am. I have seen and examined Joshua White and agree with the above assessment  and plan.  Grace Isaac MD Beeper [phone] Office [phone] 12/22/2016 3:23 PM

## 2016-12-22 NOTE — Progress Notes (Signed)
Wasted 2 mL's of Fentanyl PCA in sink with Real Cons, RN.

## 2016-12-23 ENCOUNTER — Inpatient Hospital Stay (HOSPITAL_COMMUNITY): Payer: Medicare Other

## 2016-12-23 LAB — BASIC METABOLIC PANEL
Anion gap: 9 (ref 5–15)
BUN: 10 mg/dL (ref 6–20)
CALCIUM: 9.7 mg/dL (ref 8.9–10.3)
CHLORIDE: 98 mmol/L — AB (ref 101–111)
CO2: 27 mmol/L (ref 22–32)
Creatinine, Ser: 0.7 mg/dL (ref 0.61–1.24)
GFR calc non Af Amer: >60 mL/min (ref >60)
Glucose, Bld: 91 mg/dL (ref 65–99)
Potassium: 4.3 mmol/L (ref 3.5–5.1)
SODIUM: 134 mmol/L — AB (ref 135–145)

## 2016-12-23 LAB — GLUCOSE, CAPILLARY
GLUCOSE-CAPILLARY: 115 mg/dL — AB (ref 65–99)
Glucose-Capillary: 129 mg/dL — ABNORMAL HIGH (ref 65–99)

## 2016-12-23 LAB — CBC
HEMATOCRIT: 31.8 % — AB (ref 39.0–52.0)
HEMOGLOBIN: 10.4 g/dL — AB (ref 13.0–17.0)
MCH: 31.9 pg (ref 26.0–34.0)
MCHC: 32.7 g/dL (ref 30.0–36.0)
MCV: 97.5 fL (ref 78.0–100.0)
Platelets: 318 10*3/uL (ref 150–400)
RBC: 3.26 MIL/uL — ABNORMAL LOW (ref 4.22–5.81)
RDW: 12.7 % (ref 11.5–15.5)
WBC: 7 10*3/uL (ref 4.0–10.5)

## 2016-12-23 LAB — HEMOGLOBIN A1C
Hgb A1c MFr Bld: 4.9 % (ref 4.8–5.6)
MEAN PLASMA GLUCOSE: 94 mg/dL

## 2016-12-23 MED ORDER — ENOXAPARIN SODIUM 40 MG/0.4ML ~~LOC~~ SOLN
40.0000 mg | SUBCUTANEOUS | Status: DC
  Administered 2016-12-23 – 2016-12-25 (×3): 40 mg via SUBCUTANEOUS
  Filled 2016-12-23 (×3): qty 0.4

## 2016-12-23 MED ORDER — SENNOSIDES-DOCUSATE SODIUM 8.6-50 MG PO TABS
2.0000 | ORAL_TABLET | Freq: Two times a day (BID) | ORAL | Status: DC
  Administered 2016-12-23 – 2016-12-25 (×4): 2 via ORAL
  Filled 2016-12-23 (×6): qty 2

## 2016-12-23 MED ORDER — HYDROCODONE-ACETAMINOPHEN 5-325 MG PO TABS
1.0000 | ORAL_TABLET | ORAL | Status: DC | PRN
  Administered 2016-12-24: 2 via ORAL
  Administered 2016-12-24: 1 via ORAL
  Administered 2016-12-24 – 2016-12-25 (×3): 2 via ORAL
  Filled 2016-12-23: qty 1
  Filled 2016-12-23 (×4): qty 2

## 2016-12-23 NOTE — Progress Notes (Signed)
   Subjective: No acute events over night.POD #3 s/p VATS, pleural Bx, Pleural X catheter and chest tube placement. Doing well and sitting up in a chair eating. Says his breathing is better. Pain well controlled. He has no specific complaints or concerns today. Objective:  Vital signs in last 24 hours: Vitals:   12/22/16 2029 12/23/16 0400 12/23/16 0425 12/23/16 0830  BP: 133/77 112/81  125/83  Pulse: 95 85    Resp: (!) 24 (!) 21    Temp: 98.4 F (36.9 C) 98.8 F (37.1 C)  98 F (36.7 C)  TempSrc: Oral Oral  Oral  SpO2: 97% 100%  96%  Weight:   136 lb 0.4 oz (61.7 kg)   Height:       Physical Exam  Constitutional: He is oriented to person, place, and time.  Cachectic  HENT:  Head: Normocephalic and atraumatic.  Cardiovascular: Normal rate and regular rhythm.   Respiratory: Effort normal.  Improved breath sounds bilaterally. Chest tube and Pleurx catheter present on the left. Dullness to percussion at the left lung base.  GI: Soft. Bowel sounds are normal. He exhibits no distension. There is no tenderness.  Musculoskeletal: He exhibits edema.  Mild bilateral pedal edma  Neurological: He is alert and oriented to person, place, and time.     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  # Possible metastatic disease s/p VATS, Pleural Bx, Pleurx cath and Chest tube placement ## Left Pleural Effusion POD #3. Afebrile, HDS. Breathing improved. Pain well controlled. Hg stable, no leukocytosis.Chest tube  with approximately 40 mL of output over the last 24 hours. On room air satting appropriately. Per unsigned surgery note chest tube will be removed today and PCA will be discontinued. -- Follow-up cytology from thoracentesis- No malignant cells identified. Fluid from 12/11/2016 shows atypical cells present concerning for malignancy  -- Per CVTS chest tube to suction- will be removed today -- Follow-up medical oncology recommendations -- Follow-up cardiovascular and thoracic surgery recommendations -- F/u Bx results -- Fentanyl PCA, Pain well managed we'll discontinue PCA following removal of chest tube  # Hepatitis C virus  Patient has a history of hepatitis C virus. He is currently undergoing curative treatment. -- Continue treatment  DVT/PE prophylaxis: Lovenox  FEN/GI: NPO until after surgery  Code: Full code   Dispo: Anticipated discharge pending clinical course.  Ophelia Shoulder, MD 12/23/2016, 9:17 AM Pager: [phone]

## 2016-12-23 NOTE — Progress Notes (Addendum)
      [address]       [place] [address]             [phone]       3 Days Post-Op Procedure(s) (LRB): VIDEO BRONCHOSCOPY (N/A) VIDEO ASSISTED THORACOSCOPY (VATS) W/TALC PLEURADESIS (Left) PLEURAL BIOPSY (Left) INSERTION PLEURAL DRAINAGE CATHETER (Left)  Subjective: Patient awake and alert this am. He states his breathing is better.  Objective: Vital signs in last 24 hours: Temp:  [98 F (36.7 C)-98.8 F (37.1 C)] 98 F (36.7 C) (02/24 0830) Pulse Rate:  [85-95] 85 (02/24 0400) Cardiac Rhythm: Normal sinus rhythm (02/24 0745) Resp:  [20-24] 21 (02/24 0400) BP: (112-134)/(70-83) 125/83 (02/24 0830) SpO2:  [96 %-100 %] 96 % (02/24 0830) Weight:  [136 lb 0.4 oz (61.7 kg)] 136 lb 0.4 oz (61.7 kg) (02/24 0425)      Intake/Output from previous day: 02/23 0701 - 02/24 0700 In: 1518 [P.O.:720; I.V.:798] Out: 1365 [Urine:1325; Chest Tube:40]   Physical Exam:  CV: RRR Pulmonary: Clear to auscultation on right and diminished left base Wounds: Dressing is clean and dry.   Chest Tube: to water seal, no air leak  Lab Results: CBC:  Recent Labs  12/22/16 0438 12/23/16 0425  WBC 7.5 7.0  HGB 10.1* 10.4*  HCT 31.2* 31.8*  PLT 308 318   BMET:   Recent Labs  12/22/16 0438 12/23/16 0425  NA 133* 134*  K 4.5 4.3  CL 101 98*  CO2 26 27  GLUCOSE 94 91  BUN 10 10  CREATININE 0.70 0.70  CALCIUM 9.1 9.7    PT/INR: No results for input(s): LABPROT, INR in the last 72 hours. ABG:  INR: Will add last result for INR, ABG once components are confirmed Will add last 4 CBG results once components are confirmed  Assessment/Plan:  1. CV - SR this am. 2.  Pulmonary - On room air. Chest tube with 40 cc of output last 24 hours. Will remove chest tube. CXR this am appears stable. Encourage incentive spirometer.  3. Anemia-H and H stable at 10.4 and 31.8 4. Will stop PCA after chest tube is removed 5. Will begin daily drainage of Pleur X in am.  Please record output. 6. Management per medicine  ZIMMERMAN,DONIELLE MPA-C 12/23/2016,8:56 AM  Still no final  path results from pathology yet  Chest xray in am and start draining pleurix daily   On dc daily drainage of pleurix When <150 /drainage session  for 3  Consecutive occasions then qod  When <150 /drainage session  for 3  Consecutive occasions on QOD call for evaluation for poss removal I have seen and examined Joshua White and agree with the above assessment  and plan.  Grace Isaac MD Beeper [phone] Office [phone] 12/23/2016 2:20 PM

## 2016-12-23 NOTE — Discharge Instructions (Signed)
Thoracoscopy, Care After Refer to this sheet in the next few weeks. These instructions provide you with information about caring for yourself after your procedure. Your health care provider may also give you more specific instructions. Your treatment has been planned according to current medical practices, but problems sometimes occur. Call your health care provider if you have any problems or questions after your procedure. What can I expect after the procedure? After your procedure, it is common to feel sore for up to two weeks. Follow these instructions at home:  There are many different ways to close and cover an incision, including stitches (sutures), skin glue, and adhesive strips. Follow your health care provider's instructions about:  Incision care.  Bandage (dressing) changes and removal.  Incision closure removal.  Check your incision area every day for signs of infection. Watch for:  Redness, swelling, or pain.  Fluid, blood, or pus.  Take medicines only as directed by your health care provider.  Try to cough often. Coughing helps to protect against lung infection (pneumonia). It may hurt to cough. If this happens, hold a pillow against your chest when you cough.  Take deep breaths. This also helps to protect against pneumonia.  If you were given an incentive spirometer, use it as directed by your health care provider.  Do not take baths, swim, or use a hot tub until your health care provider approves. You may take showers.  Avoid lifting until your health care provider approves.  Avoid driving until your health care provider approves.  Do not travel by airplane after the chest tube is removed until your health care provider approves. Contact a health care provider if:  You have a fever.  Pain medicines do not ease your pain.  You have redness, swelling, or increasing pain in your incision area.  You develop a cough that does not go away, or you are coughing up  mucus that is yellow or green. Get help right away if:  You have fluid, blood, or pus coming from your incision.  There is a bad smell coming from your incision or dressing.  You develop a rash.  You have difficulty breathing.  You cough up blood.  You develop light-headedness or you feel faint.  You develop chest pain.  Your heartbeat feels irregular or very fast. This information is not intended to replace advice given to you by your health care provider. Make sure you discuss any questions you have with your health care provider. Document Released: 05/05/2005 Document Revised: 06/18/2016 Document Reviewed: 07/01/2014 Elsevier Interactive Patient Education  2017 [employer].

## 2016-12-24 ENCOUNTER — Inpatient Hospital Stay (HOSPITAL_COMMUNITY): Payer: Medicare Other

## 2016-12-24 LAB — CULTURE, BODY FLUID W GRAM STAIN -BOTTLE

## 2016-12-24 LAB — CULTURE, BODY FLUID-BOTTLE: CULTURE: NO GROWTH

## 2016-12-24 NOTE — Progress Notes (Signed)
   Subjective:  Patient feels well this morning. Chest tube removed yesterday. He has some soreness but feels his pain is improving and controlled well. His breathing is improving. He walked down the hall yesterday and felt that he did well. His appetite is improving.  Objective:  Vital signs in last 24 hours: Vitals:   12/23/16 1937 12/23/16 2330 12/24/16 0326 12/24/16 0700  BP: 123/72 125/69 112/70 118/67  Pulse: 89 97 92 94  Resp: '20 17 18   '$ Temp: 97.8 F (36.6 C) 98.1 F (36.7 C) 98.7 F (37.1 C)   TempSrc: Oral Oral Oral   SpO2: 97% 98% 99%   Weight:      Height:       General: thin man, resting in bed, no acute distress Cardiac: RRR Pulm: clear to auscultation bilaterally, decreased breath sounds on left compared to right, PleurX catheter in place on the left Abd: soft, nondistended Ext: warm and well perfused Neuro: alert and oriented X3   Assessment/Plan:  Principal Problem:   Pleural effusion on left Active Problems:   Chronic hepatitis C without hepatic coma (HCC)   Chronic alcoholism in remission ([place])   History of exposure to asbestos   History of cigarette smoking   Malignant neoplasm of overlapping sites of left lung (HCC)   SOB (shortness of breath)  The patient is a 66 year old African-American male who presents with recurrent shortness of breath found to have pleural effusion on chest radiograph andCT imaging concerning for metastatic cancer.  # Suspected metastatic disease s/p VATS, Pleural Bx, Pleurx cath and Chest tube placement ## Left Pleural Effusion POD #4. Afebrile, HDS. Breathing improved. Pain well controlled. Chest tube removed on 2/24. On room air satting appropriately. Biopsy pathology still pending. -- Follow-up cytology from thoracentesis- No malignant cells identified. Fluid from 12/11/2016 shows atypical cells present concerning for malignancy  -- Per CVTS, will start daily PleurX draining -- Follow-up medical oncology  recommendations -- Follow-up cardiovascular and thoracic surgery recommendations -- F/u Bx results -- Norco 5-325 mg 1-2 tabs q4h prn pain -- Senokot-S 2 tabs BID for constipation -- Likely transfer to floor today    Dispo: Anticipated discharge pending clinical course.   Zada Finders, MD 12/24/2016, 8:26 AM

## 2016-12-24 NOTE — Progress Notes (Signed)
Central Tele called and notified that pt had a 2.68 second pause. When this RN reviewed tele strips a pause could not be identified. Called tele back and was told that a pause was not recorded for this pt.

## 2016-12-24 NOTE — Progress Notes (Addendum)
      [address]       [place] [address]             [phone]       4 Days Post-Op Procedure(s) (LRB): VIDEO BRONCHOSCOPY (N/A) VIDEO ASSISTED THORACOSCOPY (VATS) W/TALC PLEURADESIS (Left) PLEURAL BIOPSY (Left) INSERTION PLEURAL DRAINAGE CATHETER (Left)  Subjective: Patient resting. He states he has incisional pain on the left and a headache this am.  Objective: Vital signs in last 24 hours: Temp:  [97.8 F (36.6 C)-98.9 F (37.2 C)] 98.7 F (37.1 C) (02/25 0326) Pulse Rate:  [80-168] 94 (02/25 0700) Cardiac Rhythm: Normal sinus rhythm (02/25 0701) Resp:  [15-20] 18 (02/25 0326) BP: (112-125)/(66-73) 118/67 (02/25 0700) SpO2:  [97 %-99 %] 99 % (02/25 0326)      Intake/Output from previous day: 02/24 0701 - 02/25 0700 In: 200 [I.V.:200] Out: 800 [Urine:800]   Physical Exam: CV: RRR Pulmonary: Clear to auscultation on right and diminished left base Wounds: Dressing is clean and dry.     Lab Results: CBC:  Recent Labs  12/22/16 0438 12/23/16 0425  WBC 7.5 7.0  HGB 10.1* 10.4*  HCT 31.2* 31.8*  PLT 308 318   BMET:   Recent Labs  12/22/16 0438 12/23/16 0425  NA 133* 134*  K 4.5 4.3  CL 101 98*  CO2 26 27  GLUCOSE 94 91  BUN 10 10  CREATININE 0.70 0.70  CALCIUM 9.1 9.7    PT/INR: No results for input(s): LABPROT, INR in the last 72 hours. ABG:  INR: Will add last result for INR, ABG once components are confirmed Will add last 4 CBG results once components are confirmed  Assessment/Plan:  1. CV - SR this am. 2.  Pulmonary - On room air. CXR this am shows  left pleural effusion. Encourage incentive spirometer.  3. Anemia-H and H stable at 10.4 and 31.8 4. Will begin daily drainage of Pleur X . Please record output. 5. Management per medicine  ZIMMERMAN,DONIELLE MPA-C 12/24/2016,8:35 AM   On dc daily drainage of pleurex: When <150 /drainage session  for 3  Consecutive occasions then qod  When <150 /drainage  session  for 3  Consecutive occasions on QOD call for evaluation for poss removal   200 ml from plurix today, chest xray today done before drainage Path still pending , will need to check with path in am, ? Delay  I have seen and examined [name] and agree with the above assessment  and plan.  Grace Isaac MD Beeper [phone] Office [phone] 12/24/2016 10:52 AM

## 2016-12-25 ENCOUNTER — Telehealth: Payer: Self-pay | Admitting: *Deleted

## 2016-12-25 ENCOUNTER — Inpatient Hospital Stay (HOSPITAL_COMMUNITY): Payer: Medicare Other

## 2016-12-25 LAB — BASIC METABOLIC PANEL
ANION GAP: 10 (ref 5–15)
BUN: 13 mg/dL (ref 6–20)
CHLORIDE: 97 mmol/L — AB (ref 101–111)
CO2: 26 mmol/L (ref 22–32)
Calcium: 9.8 mg/dL (ref 8.9–10.3)
Creatinine, Ser: 0.78 mg/dL (ref 0.61–1.24)
GFR calc non Af Amer: >60 mL/min (ref >60)
GLUCOSE: 116 mg/dL — AB (ref 65–99)
Potassium: 4.3 mmol/L (ref 3.5–5.1)
Sodium: 133 mmol/L — ABNORMAL LOW (ref 135–145)

## 2016-12-25 MED ORDER — LACTULOSE 10 GM/15ML PO SOLN
20.0000 g | Freq: Once | ORAL | Status: AC
  Administered 2016-12-25: 20 g via ORAL
  Filled 2016-12-25: qty 30

## 2016-12-25 NOTE — Progress Notes (Signed)
   Subjective:  No acute events overnight. Patient feels well this morning. His pain is extremely well-controlled. He has not had a bowel movement in approximately one week. He had no other additional acute complaints or concerns today.  Objective:  Vital signs in last 24 hours: Vitals:   12/24/16 2331 12/25/16 0400 12/25/16 0500 12/25/16 0800  BP: 116/71 118/77  118/66  Pulse: 88 98  78  Resp: '20 15  13  '$ Temp: 99.1 F (37.3 C) 97.8 F (36.6 C)  98.8 F (37.1 C)  TempSrc: Oral Oral  Oral  SpO2: 97% 94%    Weight:   136 lb 3.9 oz (61.8 kg)   Height:       Physical Exam  Constitutional: He is oriented to person, place, and time.  Thin, resting comfortably  Cardiovascular: Normal rate and regular rhythm.   Respiratory: Effort normal and breath sounds normal.  Normal effort. Diminished breath sounds on the left.  GI: Soft. Bowel sounds are normal. He exhibits no distension.  Musculoskeletal: He exhibits no edema.  Neurological: He is alert and oriented to person, place, and time.      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  # Suspected metastatic disease s/p VATS, Pleural Bx, Pleurx cath  ## Left Pleural Effusion POD #5. Afebrile, HDS. Breathing improved. Pain well controlled. Chest tube removed on 2/24. On room air satting appropriately. Biopsy pathology still pending. I will call to attempt to find out why there is a delay in his biopsy results and to determine the underlying pathology. He  seems medically stable. He will be appropriate for discharge given pathology results and recommendations from CVTS either today or tomorrow. -- Follow-up cytology from thoracentesis- No malignant cells identified. Fluid from 12/11/2016 shows atypical cells present concerning for malignancy  -- Per CVTS, will start daily PleurX draining -- Follow-up medical oncology recommendations -- Follow-up cardiovascular and thoracic surgery recommendations -- F/u Bx results -- Norco 5-325 mg 1-2 tabs q4h prn pain -- Senokot-S 2 tabs BID for constipation + lactulose    Dispo: Anticipated discharge today or tomorrow.    Ophelia Shoulder, MD 12/25/2016, 11:27 AM

## 2016-12-25 NOTE — Telephone Encounter (Signed)
Per staff message, spoke to [place] in pathology and informed her of request.

## 2016-12-25 NOTE — Progress Notes (Addendum)
      [address]       [place] [address]             [phone]      5 Days Post-Op Procedure(s) (LRB): VIDEO BRONCHOSCOPY (N/A) VIDEO ASSISTED THORACOSCOPY (VATS) W/TALC PLEURADESIS (Left) PLEURAL BIOPSY (Left) INSERTION PLEURAL DRAINAGE CATHETER (Left)   Subjective:  Complains of abdominal pain.  States he hasn't moved his bowels in a week.  Objective: Vital signs in last 24 hours: Temp:  [97.8 F (36.6 C)-99.1 F (37.3 C)] 98.8 F (37.1 C) (02/26 0800) Pulse Rate:  [78-99] 78 (02/26 0800) Cardiac Rhythm: Normal sinus rhythm (02/26 0800) Resp:  [13-27] 13 (02/26 0800) BP: (116-156)/(66-87) 118/66 (02/26 0800) SpO2:  [94 %-98 %] 94 % (02/26 0400) Weight:  [136 lb 3.9 oz (61.8 kg)] 136 lb 3.9 oz (61.8 kg) (02/26 0500)  Intake/Output from previous day: 02/25 0701 - 02/26 0700 In: 840 [P.O.:840] Out: 600 [Urine:400; Chest Tube:200] Intake/Output this shift: Total I/O In: -  Out: 350 [Urine:350]  General appearance: alert, cooperative and no distress Heart: regular rate and rhythm Lungs: diminished breath sounds bibasilar Abdomen: soft, non-tender; bowel sounds normal; no masses,  no organomegaly Extremities: extremities normal, atraumatic, no cyanosis or edema Wound: clean andd ry  Lab Results:  Recent Labs  12/23/16 0425  WBC 7.0  HGB 10.4*  HCT 31.8*  PLT 318   BMET:  Recent Labs  12/23/16 0425  NA 134*  K 4.3  CL 98*  CO2 27  GLUCOSE 91  BUN 10  CREATININE 0.70  CALCIUM 9.7    PT/INR: No results for input(s): LABPROT, INR in the last 72 hours. ABG    Component Value Date/Time   PHART 7.411 12/21/2016 0407   HCO3 28.7 (H) 12/21/2016 0407   TCO2 30 12/21/2016 0407   O2SAT 99.0 12/21/2016 0407   CBG (last 3)   Recent Labs  12/22/16 1729 12/23/16 0827 12/23/16 1233  GLUCAP 89 115* 129*    Assessment/Plan: S/P Procedure(s) (LRB): VIDEO BRONCHOSCOPY (N/A) VIDEO ASSISTED THORACOSCOPY (VATS) W/TALC PLEURADESIS  (Left) PLEURAL BIOPSY (Left) INSERTION PLEURAL DRAINAGE CATHETER (Left)  1. Pulm- all chest tubes have been removed..... Pleur-x catheter remains in place, awaiting drainage today... Drainage instructions have been placed 2. GI- constipation, given stool softner, will give Lactulose 3. Pathology- remains pending, Dr. Servando Snare to call about pathology today 4. 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  Grace Isaac MD Beeper [phone] Office [phone] 12/25/2016 12:01 PM

## 2016-12-25 NOTE — Discharge Summary (Signed)
Name: Joshua White MRN: [id number] DOB: Sep 24, 1951 66 y.o. PCP: No Pcp Per Patient  Date of Admission: 12/18/2016  3:23 PM Date of Discharge: 12/26/2016 Attending Physician: Aldine Contes, MD  Discharge Diagnosis: 1. Recurrent pleural effusion 2. Malignancy, poorly differentiated favor adenocarcinoma per pathology report Principal Problem:   Pleural effusion on left Active Problems:   Chronic hepatitis C without hepatic coma (HCC)   Chronic alcoholism in remission ([place])   History of exposure to asbestos   History of cigarette smoking   Malignant neoplasm of overlapping sites of left lung (HCC)   SOB (shortness of breath)   Discharge Medications: Allergies as of 12/26/2016      Reactions   Tuberculin Purified Protein Derivative Swelling   ABSCESS   Sulfa Antibiotics       Medication List    STOP taking these medications   naproxen 500 MG tablet Commonly known as:  NAPROSYN     TAKE these medications   aspirin EC 81 MG tablet Take 162 mg by mouth daily.   Elbasvir-Grazoprevir 50-100 MG Tabs Commonly known as:  ZEPATIER Take 1 tablet by mouth daily.   HYDROcodone-acetaminophen 5-325 MG tablet Commonly known as:  NORCO/VICODIN Take 1-2 tablets by mouth every 4 (four) hours as needed for severe pain. What changed:  how much to take  when to take this  reasons to take this   ibuprofen 800 MG tablet Commonly known as:  ADVIL,MOTRIN Take 1 tablet (800 mg total) by mouth every 8 (eight) hours as needed. What changed:  Another medication with the same name was removed. Continue taking this medication, and follow the directions you see here.       Disposition and follow-up:   Joshua White was discharged from [facility] in Stable condition.  At the hospital follow up visit please address:  1.  Please ensure the patient follows up with oncology and CVTS.   2.  Labs / imaging needed at time of follow-up: None  3.  Pending labs/ test  needing follow-up: None  Follow-up Appointments: Follow-up Information    Grace Isaac, MD Follow up on 01/04/2017.   Specialty:  Cardiothoracic Surgery Why:  Appointment is at 12:30, please get CXR at 12:00 at [facility] located on the first floor of our office building Contact information: [address] [place] [address] [place] Follow up.   Why:  HHRN for pleurx drainage. Please drain left pleur x daily and record output. On dc daily drainage of pleurex: When <150 /drainage session  for 3  Consecutive occasions then qod  When <150 /drainage session  for 3  Consecutive occasions on QOD call for evalu Contact information: [place] [address] [phone]        Gautam Kale, MD. Schedule an appointment as soon as possible for a visit in 1 day(s).   Specialties:  Hematology, Oncology Contact information: [place] [address] [place] [facility] Course by problem list: Principal Problem:   Pleural effusion on left Active Problems:   Chronic hepatitis C without hepatic coma (HCC)   Chronic alcoholism in remission ([place])   History of exposure to asbestos   History of cigarette smoking   Malignant neoplasm of overlapping sites of left lung (HCC)   SOB (shortness of breath)   1. Recurrent pleural  effusion The patient presented to the [facility] emergency department on 12/18/2016 from his primary care physician's office with acute respiratory failure secondary to pleural effusion. The patient was seen in the emergency department 1 week prior to February 19 for respiratory failure also secondary to pleural effusion. At that time thoracentesis was performed and took off approximately 1 L of fluid. Fluid was consistent with exudate and showed atypical cells concerning for malignancy. After this initial presentation to the emergency department he  was instructed to return to his primary care physician's office in one week. At the return visit he was noted to be short of breath and he was sent back to the emergency department. CT chest demonstrated recurrent left-sided pleural effusion occupying the majority of the left hemithorax with multiple lesions concerning for metastatic lung disease. He was then admitted to the internal medicine teaching service. Once admitted CVTS was consulted and the patient underwent VATS, pleural biopsy, Pleurx catheter and chest tube placement. The patient recovered appropriately postoperatively and remained afebrile and without leukocytosis. His shortness of breath improved. His chest tube was removed on 12/23/2016. He'll be discharged with Pleurx catheter in for continued drainage with outpatient follow-up with CVTS. Pleural biopsies returned on 12/25/2016. Biopsies demonstrated poorly differentiated carcinoma favoring adenocarcinoma. Oncology was consulted and also evaluated the patient and will see the patient in follow-up in the outpatient setting. Additionally for further staging the patient had a CT abdomen and pelvis with contrast which did not show evidence of adrenal or hepatic metastasis. Additionally, the patient had an MRI brain without contrast which did not show evidence of CNS metastatic disease. He will be discharged with follow-up with oncology and CVTS.  2. Malignancy, poorly differentiated, favor adenocarcinoma per pathology As above the patient presented with recurrent exudative pleural effusions and underwent VATS with pleural biopsies. Pleural biopsies demonstrated poorly differentiated carcinoma favoring adenocarcinoma. Oncology was consulted who evaluated the patient in the inpatient setting. He will have outpatient follow-up with oncology and CVTS for ongoing management of his malignancy. Discharge Vitals:   BP 131/85 (BP Location: Right Arm)   Pulse 92   Temp 98.8 F (37.1 C) (Oral)   Resp 19    Ht '5\' 9"'$  (1.753 m)   Wt 132 lb 7.9 oz (60.1 kg)   SpO2 99%   BMI 19.57 kg/m   Pertinent Labs, Studies, and Procedures:  # CT chest abdomen and pelvis with contrast 1. Very large left-sided pleural effusion occupies most of the left hemithorax, with dense consolidation of much of the left lung. Underlying mass cannot be excluded. 2. Diffusely infiltrating mass along the left side of the mediastinum, measuring 3.6 cm adjacent to the superior mediastinum, and extending about much of the pleura of the left hemithorax, particularly medially, inferiorly and posteriorly. 3. Underlying mediastinal lymphadenopathy measures up to 1.7 cm, compatible with metastatic disease. 4. 2.1 cm nodular opacity at the right lung base is nonspecific but could reflect metastatic disease. 5. Scattered nodules about the left epicardial fat pad, reflecting metastatic disease. 6. Scattered blebs at the lung apices. 7. Mildly prominent retroperitoneal nodes, measuring up to 8 mm in short axis, are nonspecific but could reflect metastatic disease. 8. Scattered aortic atherosclerosis. Ectatic abdominal aorta at risk for aneurysm development. Recommend followup by ultrasound in 5 years. This recommendation follows ACR consensus guidelines: White Paper of the ACR Incidental Findings Committee II on Vascular Findings. J Am Coll Radiol 2013; 10:789-794.  #MRI Brain 1. No intracranial metastatic disease. 2. 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   # VATS, pleural biopsy and Pleurx catheter There are malignant nests of tumor with necrosis and debris. Immunohistochemistry reveals the tumor is positive for pancytokeratin and cytokeratin 7 with weak focal TTF-1 positivity. CDX-2, WT-1, calretinin, S100, cytokeratin 5/6, p63, cytokeratin 20, Napsin-A, and cytokeratin 903. The overall findings are consistent with a poorly  differentiated carcinoma, with the weak TTF-1 and cytokeratin 7 slightly favoring a lung adenocarcinoma  Discharge Instructions: Discharge Instructions    Diet - low sodium heart healthy    Complete by:  As directed    Discharge instructions    Complete by:  As directed    Please make sure you follow-up with your surgeon at the visit they scheduled for you.  The cancer doctor's office should call you today or tomorrow to set up your appointment. If you do not hear from them tomorrow please call the following number and schedule your appointment [phone].  Again, if you do not hear from your cancer doctors office by tomorrow please call [phone] to ensure you schedule an appointment that works with your schedule. At this visit they will discuss what the next steps in management are.   Also, please make an appointment to see your primary care doctor in the next week so they can make sure you are doing well.   Increase activity slowly    Complete by:  As directed       Signed: Ophelia Shoulder, MD 12/26/2016, 2:59 PM   Pager: [phone]

## 2016-12-25 NOTE — Progress Notes (Signed)
Transitions of Care Pharmacy Note  Plan:  Addressed concerns regarding efficacy of stool softener   Recommend removing Zaptier from medication list (as course is completed per medication reconciliation documentation)  --------------------------------------------- Joshua White is an 66 y.o. male who presents with a chief complaint of shortness of breath. In anticipation of discharge, pharmacy has reviewed this patient's prior to admission medication history, as well as current inpatient medications listed per the [facility].  Current medication indications, dosing, frequency, and notable side effects reviewed with patient and family. patient and family verbalized understanding of current inpatient medication regimen and is aware that the After Visit Summary when presented, will represent the most accurate medication list at discharge.   Joshua White expressed concerns regarding inability to have a bowel movement. We spoke about his bisacodyl which was given a few hours prior. He questioned why he had not had a bowel movement yet. I encouraged him to be patient, as this medication may take up to 12 hours to see effect. He is also aware that his Zepatier will no longer be on his medication list, as his therapy is completed (per medication reconciliation documentation).   Assessment: Understanding of regimen: good Understanding of indications: good Potential of compliance: good Barriers to Obtaining Medications: No  Patient instructed to contact inpatient pharmacy team with further questions or concerns if needed.    Time spent preparing for discharge counseling: 10 min  Time spent counseling patient: 10 min    Thank you for allowing pharmacy to be a part of this patient's care.  Argie Ramming, PharmD Pharmacy Resident  Pager [phone] 12/25/16 3:26 PM

## 2016-12-25 NOTE — Progress Notes (Signed)
[name]   HEMATOLOGY/ONCOLOGY INPATIENT PROGRESS NOTE  Date of Service: 12/25/2016  Inpatient Attending: .Aldine Contes, MD   SUBJECTIVE  Patient was seen at [facility] this afternoon. He notes that his breathing is better after his drainage of his pleural effusion and placement of pleurex catheter. He notes that he is being discharged home today with home health services. Notes his appetite is improved and he is eating better. We discussed the diagnosis was stage, prognosis and potential treatment options. We discussed that we will have the pathologist and on the Foundation 1 and PDL 1 to determine treatment options for his newly diagnosed lung cancer.    OBJECTIVE:  NAD  PHYSICAL EXAMINATION: . Vitals:   12/26/16 0415 12/26/16 0500 12/26/16 0831 12/26/16 1247  BP: 129/73   131/85  Pulse: 92   92  Resp: 20   19  Temp: 99.3 F (37.4 C)  98.6 F (37 C) 98.8 F (37.1 C)  TempSrc: Oral  Oral Oral  SpO2: 100%   99%  Weight:  132 lb 7.9 oz (60.1 kg)    Height:       Filed Weights   12/22/16 0444 12/23/16 0425 12/25/16 0500  Weight: 137 lb 5.6 oz (62.3 kg) 136 lb 0.4 oz (61.7 kg) 136 lb 3.9 oz (61.8 kg)   .Body mass index is 20.12 kg/m.  GENERAL:alert, in no acute distress and comfortable SKIN: skin color, texture, turgor are normal, no rashes or significant lesions EYES: normal, conjunctiva are pink and non-injected, sclera clear OROPHARYNX:no exudate, no erythema and lips, buccal mucosa, and tongue normal  NECK: supple, no JVD, thyroid normal size, non-tender, without nodularity LYMPH:  no palpable lymphadenopathy in the cervical, axillary or inguinal LUNGS:Decreased air entry left lung  HEART: regular rate & rhythm,  no murmurs and no lower extremity edema ABDOMEN: abdomen soft, non-tender, normoactive bowel sounds , No palpable hepatosplenomegaly  Musculoskeletal: no cyanosis of digits and no clubbing  PSYCH: alert & oriented x 3 with fluent speech NEURO:  no focal motor/sensory deficits  MEDICAL HISTORY:  Past Medical History:  Diagnosis Date  . G6PD deficiency ([place])   . H/O orthostatic hypotension   . Pneumonia   . RBBB (right bundle branch block)   . Seizures ([place])     SURGICAL HISTORY: Past Surgical History:  Procedure Laterality Date  . APPENDECTOMY    . CHEST TUBE INSERTION Left 12/20/2016   Procedure: INSERTION PLEURAL DRAINAGE CATHETER;  Surgeon: Grace Isaac, MD;  Location: [place];  Service: Thoracic;  Laterality: Left;  . PLEURAL BIOPSY Left 12/20/2016   Procedure: PLEURAL BIOPSY;  Surgeon: Grace Isaac, MD;  Location: [place];  Service: Thoracic;  Laterality: Left;  . US ECHOCARDIOGRAPHY  05/12/2008   EF 55-60%  . VIDEO ASSISTED THORACOSCOPY (VATS) W/TALC PLEUADESIS Left 12/20/2016   Procedure: VIDEO ASSISTED THORACOSCOPY (VATS) W/TALC PLEURADESIS;  Surgeon: Grace Isaac, MD;  Location: [place];  Service: Thoracic;  Laterality: Left;  [name] VIDEO BRONCHOSCOPY N/A 12/20/2016   Procedure: VIDEO BRONCHOSCOPY;  Surgeon: Grace Isaac, MD;  Location: [place];  Service: Thoracic;  Laterality: N/A;    SOCIAL HISTORY: Social History   Social History  . Marital status: Single    Spouse name: N/A  . Number of children: N/A  . Years of education: N/A   Occupational History  . Not on file.   Social History Main Topics  . Smoking status: Former Smoker    Packs/day: 2.00    Years: 30.00  Types: Cigars  . Smokeless tobacco: Never Used     Comment: Quit ~2017  . Alcohol use No     Comment: Former use  . Drug use: No  . Sexual activity: Not on file   Other Topics Concern  . Not on file   Social History Narrative  . No narrative on file    FAMILY HISTORY: Family History  Problem Relation Age of Onset  . Lung cancer Mother   . Brain cancer Mother   . Heart disease Father   . Hypertension Father   . Heart attack Father     ALLERGIES:  is allergic to tuberculin purified protein derivative and sulfa  antibiotics.  MEDICATIONS:  Scheduled Meds: . bisacodyl  10 mg Oral Daily  . Chlorhexidine Gluconate Cloth  6 each Topical Daily  . enoxaparin (LOVENOX) injection  40 mg Subcutaneous Q24H  . feeding supplement (ENSURE ENLIVE)  237 mL Oral TID BM  . mouth rinse  15 mL Mouth Rinse BID  . pantoprazole  40 mg Oral Daily  . senna-docusate  2 tablet Oral BID  . sodium chloride flush  10-40 mL Intracatheter Q12H   Continuous Infusions: . dextrose 5 % and 0.9 % NaCl with KCl 40 mEq/L 10 mL/hr at 12/23/16 0400   PRN Meds:.HYDROcodone-acetaminophen, ondansetron **OR** ondansetron (ZOFRAN) IV, potassium chloride (KCL MULTIRUN) 30 mEq in 265 mL IVPB, sodium chloride flush, traMADol  REVIEW OF SYSTEMS:    10 Point review of Systems was done is negative except as noted above.   LABORATORY DATA:  I have reviewed the data as listed  . CBC Latest Ref Rng & Units 12/23/2016 12/22/2016 12/21/2016  WBC 4.0 - 10.5 K/uL 7.0 7.5 10.3  Hemoglobin 13.0 - 17.0 g/dL 10.4(L) 10.1(L) 10.7(L)  Hematocrit 39.0 - 52.0 % 31.8(L) 31.2(L) 32.9(L)  Platelets 150 - 400 K/uL 318 308 358    . CMP Latest Ref Rng & Units 12/25/2016 12/23/2016 12/22/2016  Glucose 65 - 99 mg/dL 116(H) 91 94  BUN 6 - 20 mg/dL '13 10 10  '$ Creatinine 0.61 - 1.24 mg/dL 0.78 0.70 0.70  Sodium 135 - 145 mmol/L 133(L) 134(L) 133(L)  Potassium 3.5 - 5.1 mmol/L 4.3 4.3 4.5  Chloride 101 - 111 mmol/L 97(L) 98(L) 101  CO2 22 - 32 mmol/L '26 27 26  '$ Calcium 8.9 - 10.3 mg/dL 9.8 9.7 9.1  Total Protein 6.5 - 8.1 g/dL - - 7.1  Total Bilirubin 0.3 - 1.2 mg/dL - - 0.8  Alkaline Phos 38 - 126 U/L - - 53  AST 15 - 41 U/L - - 19  ALT 17 - 63 U/L - - 10(L)     RADIOGRAPHIC STUDIES: I have personally reviewed the radiological images as listed and agreed with the findings in the report. Dg Chest 1 View  Result Date: 12/19/2016 CLINICAL DATA:  Status post left-sided thoracentesis. Known left-sided superior mediastinal mass. EXAM: CHEST 1 VIEW  COMPARISON:  Chest x-ray of December 18 2016 and chest CT scan of December 18, 2016. FINDINGS: The volume of pleural fluid on the left has decreased somewhat. No postprocedure pneumothorax is observed. The right lung is well-expanded and clear. The right heart border is normal. The left heart border is obscured as are the aortic arch and left hilar structures. Masslike density above the aortic arch is again demonstrated. The observed bony thorax exhibits no acute abnormality. IMPRESSION: No postprocedure pneumothorax follow-up following left-sided thoracentesis. There is decreased pleural effusion on the left. Electronically Signed  By: David  Martinique M.D.   On: 12/19/2016 09:56   Dg Chest 1 View  Result Date: 12/11/2016 CLINICAL DATA:  66 year old who underwent left thoracentesis earlier this afternoon with removal of 1.3 L of fluid. The thoracentesis was discontinued due to chest pain. EXAM: CHEST 1 VIEW 3:02 p.m.: COMPARISON:  Two-view chest x-ray earlier today 9:24 a.m. 02/23/2011. FINDINGS: Interval decrease in size of the still large left pleural effusion. No pneumothorax. Slight improved aeration in the left upper lobe, though dense passive atelectasis and/or pneumonia persists in the left lower lobe and lingula. Right lung remains clear. IMPRESSION: 1. No pneumothorax after left thoracentesis. 2. Interval decrease in size of the still large left pleural effusion since earlier today. 3. Slight improved aeration in the left upper lobe, though dense passive atelectasis and/or pneumonia persists in the left lower lobe and lingula. Electronically Signed   By: Evangeline Dakin M.D.   On: 12/11/2016 15:35   Dg Chest 2 View  Result Date: 12/25/2016 CLINICAL DATA:  Postop check. 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 The largest and most anterior fluid level is now more fluid and gas, but overall disease and  opacification is similar to prior. Left-sided pleural thickening correlating with thick enhancing pleura on previous CT. Left pleural drain is in similar position to prior. Left subclavian line with tip near the SVC origin. Clear right chest. Stable heart size. 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 No significant change in left chest opacification. Electronically Signed   By: Monte Fantasia M.D.   On: 12/25/2016 14:10   Dg Chest 2 View  Result Date: 12/24/2016 CLINICAL DATA:  Two days post talc pleurodesis and PleurX catheter placement. EXAM: CHEST  2 VIEW COMPARISON:  One-view chest x-ray 12/23/2016. FINDINGS: The heart is mildly enlarged.  A left subclavian line is in place. Upright imaging demonstrates a fluid level compatible with a large residual left-sided hydropneumothorax. Left lower lobe airspace disease remains. IMPRESSION: 1. Persistent large left hydropneumothorax with catheter placed. 2. Left lower lobe airspace disease. Electronically Signed   By: San Morelle M.D.   On: 12/24/2016 09:15   Dg Chest 2 View  Result Date: 12/18/2016 CLINICAL DATA:  Shortness of breath and cough.  Chest pain. EXAM: CHEST  2 VIEW COMPARISON:  Seven days prior FINDINGS: Persistently enlarged left pleural effusion, reaching the apex. Masslike appearance over the medial left apex, superimposed on the aortic knob. Mild or atelectasis at the right base. Stable non obscured heart size. IMPRESSION: Large left pleural effusion with concerning masslike appearance at the left apex. Recommend chest CT with contrast. Electronically Signed   By: Monte Fantasia M.D.   On: 12/18/2016 14:14   Dg Chest 2 View  Result Date: 12/11/2016 CLINICAL DATA:  Hemoptysis EXAM: CHEST  2 VIEW COMPARISON:  02/23/2011 chest radiograph. FINDINGS: Stable cardiomediastinal silhouette with normal heart size. No pneumothorax. No right pleural effusion. Large left pleural effusion with mild  right mediastinal deviation. Near complete opacification of the left lung, with mild residual aeration in the apical left upper lobe. No consolidative airspace disease in the right lung. No pulmonary edema. IMPRESSION: Large left pleural effusion with near complete left lung opacification, with mild residual aeration in the apical left upper lobe. Left lung opacification could be due to atelectasis, mass and/or pneumonia. Follow-up post thoracentesis chest imaging advised. Electronically Signed   By: Janina Mayo.D.  On: 12/11/2016 09:32   Ct Chest W Contrast  Result Date: 12/18/2016 CLINICAL DATA:  Chronic upper abdominal pain under the ribs for 6 weeks. Shortness of breath. Generalized chest and back pain, with difficulty breathing. Initial encounter. EXAM: CT CHEST, ABDOMEN, AND PELVIS WITH CONTRAST TECHNIQUE: Multidetector CT imaging of the chest, abdomen and pelvis was performed following the standard protocol during bolus administration of intravenous contrast. CONTRAST:  100 mL ISOVUE-300 IOPAMIDOL (ISOVUE-300) INJECTION 61% COMPARISON:  Chest radiograph performed earlier today at 2:04 p.m. FINDINGS: CT CHEST FINDINGS Cardiovascular: The heart remains normal in size. The ascending thoracic aorta is borderline normal in caliber. Minimal calcification is seen along the aortic arch and descending thoracic aorta. The great vessels are grossly unremarkable in appearance. Mediastinum/Nodes: There is diffusely infiltrating mass along the left side of the mediastinum, with associated enlarged nodes measuring up to 1.7 cm at the aortopulmonary window. The mass measures 3.6 cm adjacent to the superior mediastinum, with diffuse pleural extension of disease noted about much of the left hemithorax. No pericardial effusion is identified. The visualized portions of the thyroid gland are unremarkable. No axillary lymphadenopathy is appreciated. Lungs/Pleura: A very large left-sided pleural effusion occupies most of  the left hemithorax. The expanded portions of the left lung are grossly clear. There is dense consolidation of much of the left lung; underlying mass cannot be excluded. Diffuse pleural thickening, most prominent along the mediastinum and along the posterior and inferior aspects of the hemithorax, reflects the patient's lung malignancy. A 2.1 cm nodular opacity at the right lung base is nonspecific but could reflect metastatic disease. Scattered blebs are noted at the lung apices. No pneumothorax is seen. Scattered nodules are noted about the left epicardial fat pad, reflecting metastatic disease. Musculoskeletal: No acute osseous abnormalities are identified. The visualized musculature is unremarkable in appearance. CT ABDOMEN PELVIS FINDINGS Hepatobiliary: The liver is unremarkable in appearance. The gallbladder is unremarkable in appearance. The common bile duct remains normal in caliber. Pancreas: The pancreas is within normal limits. Spleen: The spleen is unremarkable in appearance. Adrenals/Urinary Tract: The adrenal glands are unremarkable in appearance. The kidneys are within normal limits. There is no evidence of hydronephrosis. No renal or ureteral stones are identified. No perinephric stranding is seen. Stomach/Bowel: The stomach is unremarkable in appearance. The small bowel is within normal limits. The patient is status post appendectomy. The colon is unremarkable in appearance. Vascular/Lymphatic: There is ectasia of the distal abdominal aorta to 2.7 cm in AP dimension, without evidence of aneurysmal dilatation. Scattered calcification is seen along the abdominal aorta and its branches. Mildly prominent retroperitoneal nodes, measuring up to 8 mm in short axis, are nonspecific but may reflect metastatic disease. No pelvic sidewall lymphadenopathy is seen. Reproductive: The bladder is moderately distended and grossly unremarkable. The prostate remains normal in size. Other: No additional soft tissue  abnormalities are seen. Musculoskeletal: No acute osseous abnormalities are identified. The visualized musculature is unremarkable in appearance. IMPRESSION: 1. Very large left-sided pleural effusion occupies most of the left hemithorax, with dense consolidation of much of the left lung. Underlying mass cannot be excluded. 2. Diffusely infiltrating mass along the left side of the mediastinum, measuring 3.6 cm adjacent to the superior mediastinum, and extending about much of the pleura of the left hemithorax, particularly medially, inferiorly and posteriorly. 3. Underlying mediastinal lymphadenopathy measures up to 1.7 cm, compatible with metastatic disease. 4. 2.1 cm nodular opacity at the right lung base is nonspecific but could reflect metastatic disease. 5. Scattered  nodules about the left epicardial fat pad, reflecting metastatic disease. 6. Scattered blebs at the lung apices. 7. Mildly prominent retroperitoneal nodes, measuring up to 8 mm in short axis, are nonspecific but could reflect metastatic disease. 8. Scattered aortic atherosclerosis. Ectatic abdominal aorta at risk for aneurysm development. Recommend followup by ultrasound in 5 years. This recommendation follows ACR consensus guidelines: White Paper of the ACR Incidental Findings Committee II on Vascular Findings. J Am Coll Radiol 2013; 10:789-794. Electronically Signed   By: Garald Balding M.D.   On: 12/18/2016 18:28   Mr Jeri Cos QH Contrast  Result Date: 12/18/2016 CLINICAL DATA:  Lung cancer.  Evaluation for metastatic disease. EXAM: MRI HEAD WITHOUT AND WITH CONTRAST TECHNIQUE: Multiplanar, multiecho pulse sequences of the brain and surrounding structures were obtained without and with intravenous contrast. CONTRAST:  4m MULTIHANCE GADOBENATE DIMEGLUMINE 529 MG/ML IV SOLN COMPARISON:  Brain MRI 11/14/2005 FINDINGS: Brain: No focal diffusion restriction to indicate acute infarct. No intraparenchymal hemorrhage. There is a punctate old infarct  in the left cerebellum and a small, old lacunar infarct of the right basal ganglia. There is mild multifocal hyperintense T2-weighted signal within the periventricular white matter, most often seen in the setting of chronic microvascular ischemia. No mass lesion or midline shift. No hydrocephalus or extra-axial fluid collection. The midline structures are normal. No age advanced or lobar predominant atrophy. No contrast-enhancing lesions. Vascular: Major intracranial arterial and venous sinus flow voids are preserved. No evidence of chronic microhemorrhage or amyloid angiopathy. Skull and upper cervical spine: The visualized skull base, calvarium, upper cervical spine and extracranial soft tissues are normal. Sinuses/Orbits: No fluid levels or advanced mucosal thickening. No mastoid effusion. Normal orbits. IMPRESSION: 1. No intracranial metastatic disease. 2. 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 Electronically Signed   By: KUlyses JarredM.D.   On: 12/18/2016 22:07   Ct Abdomen Pelvis W Contrast  Result Date: 12/18/2016 CLINICAL DATA:  Chronic upper abdominal pain under the ribs for 6 weeks. Shortness of breath. Generalized chest and back pain, with difficulty breathing. Initial encounter. EXAM: CT CHEST, ABDOMEN, AND PELVIS WITH CONTRAST TECHNIQUE: Multidetector CT imaging of the chest, abdomen and pelvis was performed following the standard protocol during bolus administration of intravenous contrast. CONTRAST:  100 mL ISOVUE-300 IOPAMIDOL (ISOVUE-300) INJECTION 61% COMPARISON:  Chest radiograph performed earlier today at 2:04 p.m. FINDINGS: CT CHEST FINDINGS Cardiovascular: The heart remains normal in size. The ascending thoracic aorta is borderline normal in caliber. Minimal calcification is seen along the aortic arch and descending thoracic aorta. The great vessels are grossly unremarkable in appearance. Mediastinum/Nodes:  There is diffusely infiltrating mass along the left side of the mediastinum, with associated enlarged nodes measuring up to 1.7 cm at the aortopulmonary window. The mass measures 3.6 cm adjacent to the superior mediastinum, with diffuse pleural extension of disease noted about much of the left hemithorax. No pericardial effusion is identified. The visualized portions of the thyroid gland are unremarkable. No axillary lymphadenopathy is appreciated. Lungs/Pleura: A very large left-sided pleural effusion occupies most of the left hemithorax. The expanded portions of the left lung are grossly clear. There is dense consolidation of much of the left lung; underlying mass cannot be excluded. Diffuse pleural thickening, most prominent along the mediastinum and along the posterior and inferior aspects of the hemithorax, reflects the patient's lung malignancy. A 2.1 cm nodular opacity at the right lung base is nonspecific but could reflect  metastatic disease. Scattered blebs are noted at the lung apices. No pneumothorax is seen. Scattered nodules are noted about the left epicardial fat pad, reflecting metastatic disease. Musculoskeletal: No acute osseous abnormalities are identified. The visualized musculature is unremarkable in appearance. CT ABDOMEN PELVIS FINDINGS Hepatobiliary: The liver is unremarkable in appearance. The gallbladder is unremarkable in appearance. The common bile duct remains normal in caliber. Pancreas: The pancreas is within normal limits. Spleen: The spleen is unremarkable in appearance. Adrenals/Urinary Tract: The adrenal glands are unremarkable in appearance. The kidneys are within normal limits. There is no evidence of hydronephrosis. No renal or ureteral stones are identified. No perinephric stranding is seen. Stomach/Bowel: The stomach is unremarkable in appearance. The small bowel is within normal limits. The patient is status post appendectomy. The colon is unremarkable in appearance.  Vascular/Lymphatic: There is ectasia of the distal abdominal aorta to 2.7 cm in AP dimension, without evidence of aneurysmal dilatation. Scattered calcification is seen along the abdominal aorta and its branches. Mildly prominent retroperitoneal nodes, measuring up to 8 mm in short axis, are nonspecific but may reflect metastatic disease. No pelvic sidewall lymphadenopathy is seen. Reproductive: The bladder is moderately distended and grossly unremarkable. The prostate remains normal in size. Other: No additional soft tissue abnormalities are seen. Musculoskeletal: No acute osseous abnormalities are identified. The visualized musculature is unremarkable in appearance. IMPRESSION: 1. Very large left-sided pleural effusion occupies most of the left hemithorax, with dense consolidation of much of the left lung. Underlying mass cannot be excluded. 2. Diffusely infiltrating mass along the left side of the mediastinum, measuring 3.6 cm adjacent to the superior mediastinum, and extending about much of the pleura of the left hemithorax, particularly medially, inferiorly and posteriorly. 3. Underlying mediastinal lymphadenopathy measures up to 1.7 cm, compatible with metastatic disease. 4. 2.1 cm nodular opacity at the right lung base is nonspecific but could reflect metastatic disease. 5. Scattered nodules about the left epicardial fat pad, reflecting metastatic disease. 6. Scattered blebs at the lung apices. 7. Mildly prominent retroperitoneal nodes, measuring up to 8 mm in short axis, are nonspecific but could reflect metastatic disease. 8. Scattered aortic atherosclerosis. Ectatic abdominal aorta at risk for aneurysm development. Recommend followup by ultrasound in 5 years. This recommendation follows ACR consensus guidelines: White Paper of the ACR Incidental Findings Committee II on Vascular Findings. J Am Coll Radiol 2013; 10:789-794. Electronically Signed   By: Garald Balding M.D.   On: 12/18/2016 18:28   Dg Chest  Port 1 View  Result Date: 12/23/2016 CLINICAL DATA:  Left VATS with talc pleurodesis. EXAM: PORTABLE CHEST 1 VIEW COMPARISON:  12/22/2016 and prior exams FINDINGS: A left subclavian central venous catheter with tip overlying the proximal SVC and left thoracostomy tube again noted. Postsurgical changes and atelectasis within the left mid and lower lung again noted. There is no evidence of pneumothorax. Left pleural thickening/fluid and superior left paramediastinal density are unchanged. IMPRESSION: Unchanged appearance of the chest as described above. No evidence of pneumothorax. Electronically Signed   By: Margarette Canada M.D.   On: 12/23/2016 09:22   Dg Chest Port 1 View  Result Date: 12/22/2016 CLINICAL DATA:  66 year old male with chest tube, follow-up radiograph. EXAM: PORTABLE CHEST 1 VIEW COMPARISON:  Chest radiograph dated 12/21/2016 FINDINGS: Left-sided chest tube and left subclavian central venous line in stable positioning. There has been interval progression of the opacity in the left mid to lower lung field compared to the prior radiograph. Left apical pleural thickening as well as  left superior paramediastinal density corresponding to the pleural based mass seen on the prior CT. A small left pleural effusion remains. The right lung is clear. There is no pneumothorax. The cardiac silhouette is within normal limits. No acute osseous pathology. IMPRESSION: 1. Stable positioning of the left chest tube and subclavian central venous line. 2. Progression of the airspace density involving the left mid to lower lung field compared to the prior radiograph. 3. Small left pleural effusion and left lateral and apical pleural thickening. 4. Left upper lobe paramediastinal soft tissue density corresponding to the pleural based mass seen on the CT. Electronically Signed   By: Anner Crete M.D.   On: 12/22/2016 04:25   Dg Chest Port 1 View  Result Date: 12/21/2016 CLINICAL DATA:  Status post thoracoscopy with  pleurodesis and pleural biopsy and chest tube placement on the left on 20 December 2016. EXAM: PORTABLE CHEST 1 VIEW COMPARISON:  Portable chest x-ray of December 20, 2016 FINDINGS: The left-sided pneumothorax has decreased in volume but a small amount remains along the left mid and lower hemithorax laterally. The left chest tube tip is stable lying in the medial aspect of the interspace between the posterior portions of the fourth and fifth ribs. Vertically-oriented parenchymal density persists in the periphery of the left mid lung. There is a small amount of pleural fluid on the left. The right lung is clear. The heart is normal in size. The pulmonary vascularity is not engorged. The right subclavian venous catheter tip projects over the proximal SVC. IMPRESSION: Decreased volume of the left pneumothorax. Persistent parenchymal density peripherally in the left mid lung adjacent to the pleural surface. Probable trace left pleural effusion. The left chest tube is in stable position. No pneumonia on the right nor CHF. Electronically Signed   By: David  Martinique M.D.   On: 12/21/2016 07:30   Dg Chest Port 1 View  Result Date: 12/20/2016 CLINICAL DATA:  Pneumothorax, left-sided chest pain EXAM: PORTABLE CHEST 1 VIEW COMPARISON:  Chest x-ray of 12/19/2016 FINDINGS: After evacuation of a left pleural effusion there is a left pneumothorax, possibly ex vacuo. A left chest tube is present. Some left lung opacity is noted peripherally possibly due to atelectasis or re- expansion. The right lung is clear. Mild cardiomegaly is stable. Left central venous line tip overlies the upper SVC IMPRESSION: 1. Left pneumothorax after evacuation of the left pleural effusion, possibly ex vacuo well. Left chest tube is present. 2. Parenchymal opacity in the left mid lung peripherally possibly due to atelectasis or re-expansion. Electronically Signed   By: Ivar Drape M.D.   On: 12/20/2016 17:10   US Thoracentesis Asp Pleural Space  W/img Guide  Result Date: 12/19/2016 INDICATION: Left-sided chest mass. Recurrent large left pleural effusion. Request diagnostic and therapeutic thoracentesis. EXAM: ULTRASOUND GUIDED LEFT THORACENTESIS MEDICATIONS: None. COMPLICATIONS: None immediate. Postprocedural chest x-ray negative for pneumothorax. PROCEDURE: An ultrasound guided thoracentesis was thoroughly discussed with the patient and questions answered. The benefits, risks, alternatives and complications were also discussed. The patient understands and wishes to proceed with the procedure. Written consent was obtained. Ultrasound was performed to localize and mark an adequate pocket of fluid in the left chest. The area was then prepped and draped in the normal sterile fashion. 1% Lidocaine was used for local anesthesia. Under ultrasound guidance a Safe-T-Centesis catheter was introduced. Thoracentesis was performed. The catheter was removed and a dressing applied. FINDINGS: A total of approximately 1.1 L of hazy, blood-tinged fluid was removed. Patient developed  significant discomfort and cough at this point. The procedure was terminated. Samples were sent to the laboratory as requested by the clinical team. IMPRESSION: Successful ultrasound guided left thoracentesis yielding 1.1 L of pleural fluid. Read by: Ascencion Dike PA-C Electronically Signed   By: Marybelle Killings M.D.   On: 12/19/2016 09:58   US Thoracentesis Asp Pleural Space W/img Guide  Result Date: 12/11/2016 INDICATION: Hemoptysis and shortness of breath for 10 weeks. New findings of left-sided pleural effusion. Request is made for diagnostic and therapeutic thoracentesis. EXAM: ULTRASOUND GUIDED DIAGNOSTIC AND THERAPEUTIC THORACENTESIS MEDICATIONS: 1% lidocaine COMPLICATIONS: None immediate. PROCEDURE: An ultrasound guided thoracentesis was thoroughly discussed with the patient and questions answered. The benefits, risks, alternatives and complications were also discussed. The patient  understands and wishes to proceed with the procedure. Written consent was obtained. Ultrasound was performed to localize and mark an adequate pocket of fluid in the left chest. The area was then prepped and draped in the normal sterile fashion. 1% Lidocaine was used for local anesthesia. Under ultrasound guidance a Saf-T-Centesis catheter was introduced. Thoracentesis was performed. The catheter was removed and a dressing applied. FINDINGS: A total of approximately 1.3 L of serosanguineous fluid was removed. Samples were sent to the laboratory as requested by the clinical team. IMPRESSION: Successful ultrasound guided left thoracentesis yielding 1.3 L of pleural fluid. The procedure was terminated at this amount secondary to coughing and chest pain. The patient will be arranged to return as an outpatient for a follow up thoracentesis and CT of the chest. This was discussed with Dr. Alvino Chapel, ED MD. Read by: Saverio Danker, PA-C Electronically Signed   By: Sandi Mariscal M.D.   On: 12/11/2016 15:19    ASSESSMENT & PLAN:   65 year old male with  #1 Newly diagnosed Metastatic lung adenocarcinoma (with M1a disease - pleural nodules and malignant pleural effusion) #2 Recurrent left-sided malignant pleural effusion -now s/p pleurex catheter placement. No overt evidence of metastatic disease outside of the chest cavity or in the brain. Pathology showed poorly differentiated adenocarcinoma. Plan -Appreciate pulmonary and cardiothoracic surgery input with regards to management of his recurrent malignant left-sided pleural -patient will continue to f/u with CT surgery Dr Servando Snare in clinic for mx of his pleurex cath and pleural effusion -We have called pathology and added on a foundation 1 and PDL 1 testing for his tumor -We will decide on systemic palliative therapies based on his molecular mutation profile, PDL 1 status. If he did not have a targetable mutation might need to consider platinum doublet with Alimta  with G-CSF support.  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 -Dietitian input. -Consider low-dose dexamethasone 2 mg by mouth daily for a couple weeks.  We have called my scheduler [place] to help set him up for follow-up in 10-14 days in the cancer clinic with repeat labs. He was given our contact information to call if he has any additional questions or concerns.  I spent 30 minutes counseling the patient face to face. The total time spent in the appointment was 40 minutes and more than 50% was on counseling and direct patient cares.  Sullivan Lone MD [place] AAHIVMS [facility] Hematology/Oncology Physician [facility]  (Office):       [phone] (Work cell):  [phone] (Fax):           [phone]  12/25/2016 1:06 PM

## 2016-12-25 NOTE — Progress Notes (Signed)
Patient's pleurx catheter drained with 50cc of sanguineous output. Patient and family member educated on pleurx drain.

## 2016-12-25 NOTE — Telephone Encounter (Signed)
-----  Message from Brunetta Genera, MD sent at 12/25/2016  1:14 PM EST ----- Regarding: Pathology testing add on Joshua White, Could you please call pathology and have them add on  Foundation One testing and PDL1 testing to this patient recent lung biopsy from 12/20/2016 (new diagnosis of lung adenocarcinoma)  Thanks, GK

## 2016-12-26 ENCOUNTER — Other Ambulatory Visit: Payer: Self-pay | Admitting: *Deleted

## 2016-12-26 DIAGNOSIS — F1021 Alcohol dependence, in remission: Secondary | ICD-10-CM

## 2016-12-26 DIAGNOSIS — J910 Malignant pleural effusion: Secondary | ICD-10-CM

## 2016-12-26 DIAGNOSIS — C3490 Malignant neoplasm of unspecified part of unspecified bronchus or lung: Secondary | ICD-10-CM

## 2016-12-26 DIAGNOSIS — Z681 Body mass index (BMI) 19 or less, adult: Secondary | ICD-10-CM

## 2016-12-26 DIAGNOSIS — C3482 Malignant neoplasm of overlapping sites of left bronchus and lung: Secondary | ICD-10-CM

## 2016-12-26 DIAGNOSIS — B1920 Unspecified viral hepatitis C without hepatic coma: Secondary | ICD-10-CM

## 2016-12-26 DIAGNOSIS — R630 Anorexia: Secondary | ICD-10-CM

## 2016-12-26 DIAGNOSIS — R64 Cachexia: Secondary | ICD-10-CM

## 2016-12-26 LAB — CBC
HEMATOCRIT: 29 % — AB (ref 39.0–52.0)
HEMOGLOBIN: 9.6 g/dL — AB (ref 13.0–17.0)
MCH: 31.9 pg (ref 26.0–34.0)
MCHC: 33.1 g/dL (ref 30.0–36.0)
MCV: 96.3 fL (ref 78.0–100.0)
Platelets: 312 10*3/uL (ref 150–400)
RBC: 3.01 MIL/uL — AB (ref 4.22–5.81)
RDW: 12.7 % (ref 11.5–15.5)
WBC: 6.9 10*3/uL (ref 4.0–10.5)

## 2016-12-26 MED ORDER — HYDROCODONE-ACETAMINOPHEN 5-325 MG PO TABS: 1.0000 | ORAL_TABLET | ORAL | 0 refills | Status: DC | PRN

## 2016-12-26 NOTE — Progress Notes (Signed)
Patient's visitors/family requested to have RN drain the patient's pleurx catheter and allow them to watch. RN drained pleurx and instructed visitors/family on the steps to drain the pleurx once the patient goes home. Visitors/family were receptive to instructions and felt they would be able to drain it once the patient was at home. 50cc drained today.

## 2016-12-26 NOTE — Progress Notes (Signed)
Patient's visitors/family were not comfortable draining the patient's Plurex catheter yet. Patient reports that they have videos they will watch several times before doing this and will watch staff drain and change dressing again today.

## 2016-12-26 NOTE — Progress Notes (Signed)
Discharge Note. PIV removed without any problems. Reviewed with patient and family at the bedside all discharge instructions/education including but not limited to care of the Pleurex and that home health will be coming, all medications, gave Rx for PRN pain medication, slowly progress activity as tolerated, and all follow up appointments with importance of attending each. Patient and family receptive to all education. Patient will be discharging home once he is dressed and ready.

## 2016-12-26 NOTE — Evaluation (Signed)
Physical Therapy Evaluation/ Discharge  Patient Details Name: Joshua White MRN: [id number] DOB: 12-10-1950 Today's Date: 12/26/2016   History of Present Illness  66 yo admitted with SOB found to have left pleural effusion and lung CA s/p VATS. 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 Pt able to complete all mobility and gait without need for assist, no LOB including head turns and change of speed and direction with gait, no SOB with activity. Pt will have assist of son as needed as well as friend at home. Pt at baseline functional status without further therapy needs at this time, pt aware and agreeable, will sign off.   HR 114 sats 100% on RA    Follow Up Recommendations No PT follow up    Equipment Recommendations  None recommended by PT    Recommendations for Other Services       Precautions / Restrictions Precautions Precautions: None Restrictions Weight Bearing Restrictions: No      Mobility  Bed Mobility Overal bed mobility: Modified Independent                Transfers Overall transfer level: Independent                  Ambulation/Gait Ambulation/Gait assistance: Independent Ambulation Distance (Feet): 800 Feet Assistive device: None Gait Pattern/deviations: WFL(Within Functional Limits)   Gait velocity interpretation: at or above normal speed for age/gender    Stairs            Wheelchair Mobility    Modified Rankin (Stroke Patients Only)       Balance Overall balance assessment: No apparent balance deficits (not formally assessed)                                           Pertinent Vitals/Pain Pain Assessment: No/denies pain    Home Living Family/patient expects to be discharged to:: Private residence Living Arrangements: Children Available Help at Discharge: Available PRN/intermittently Type of Home: House Home Access: Level  entry     Home Layout: One level Home Equipment: None      Prior Function Level of Independence: Independent               Hand Dominance        Extremity/Trunk Assessment   Upper Extremity Assessment Upper Extremity Assessment: Overall WFL for tasks assessed    Lower Extremity Assessment Lower Extremity Assessment: Overall WFL for tasks assessed    Cervical / Trunk Assessment Cervical / Trunk Assessment: Normal  Communication   Communication: No difficulties  Cognition Arousal/Alertness: Awake/alert Behavior During Therapy: WFL for tasks assessed/performed Overall Cognitive Status: Within Functional Limits for tasks assessed                      General Comments      Exercises     Assessment/Plan    PT Assessment Patent does not need any further PT services  PT Problem List         PT Treatment Interventions      PT Goals (Current goals can be found in the Care Plan section)  Acute Rehab PT Goals PT Goal Formulation: All assessment and education complete, DC therapy    Frequency     Barriers to discharge  Co-evaluation               End of Session   Activity Tolerance: Patient tolerated treatment well Patient left: in bed;with call bell/phone within reach Nurse Communication: Mobility status PT Visit Diagnosis: Other (comment) (requested to evaluate post VATS)         Time: 4696-2952 PT Time Calculation (min) (ACUTE ONLY): 13 min   Charges:   PT Evaluation $PT Eval Low Complexity: 1 Procedure     PT G Codes:         Serine Kea B Joleen Stuckert January 09, 2017, 10:26 AM  Delaney Meigs, PT [phone]

## 2016-12-26 NOTE — Progress Notes (Signed)
   Subjective:  No acute events overnight. Patient had multiple episodes of loose formed stool overnight. He was having constipation and was treated with an aggressive bowel regimen. We'll discontinue his bowel regimen today. He denies chest pain or shortness of breath. He states he is a little sore from surgery but otherwise recovering well. He was able to ambulate around the hall with assistance this morning.  He is aware of his cancer diagnosis. He cannot remember the exact type of cancer but was told that the pathology did show cancer. He is aware that he will have follow-up with oncology when he leaves the hospital. [facility] plan for discharge this afternoon.  Objective:  Vital signs in last 24 hours: Vitals:   12/25/16 2314 12/26/16 0415 12/26/16 0500 12/26/16 0831  BP: 128/79 129/73    Pulse: 92 92    Resp: (!) 24 20    Temp: 99.2 F (37.3 C) 99.3 F (37.4 C)  98.6 F (37 C)  TempSrc: Oral Oral  Oral  SpO2: 100% 100%    Weight:   132 lb 7.9 oz (60.1 kg)   Height:       Physical Exam  Constitutional: He is oriented to person, place, and time.  Thin, resting comfortably  Cardiovascular: Normal rate and regular rhythm.   Respiratory: Effort normal and breath sounds normal.  Normal effort. Diminished breath sounds on the left.  GI: Soft. Bowel sounds are normal. He exhibits no distension.  Musculoskeletal: He exhibits no edema.  Neurological: He is alert and oriented to person, place, and time.      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  # Suspected metastatic disease s/p VATS, Pleural Bx, Pleurx cath  ## Left Pleural Effusion POD #6. Afebrile, HDS. Breathing improved. Pain well controlled. Chest tube removed on 2/24. On room air satting appropriately. Pathology is positive for malignancy and is reported as poorly differentiated carcinoma with slight favor of adenocarcinoma. He is aware of his cancer diagnosis. We will plan for discharge this afternoon. He will have outpatient follow-up with oncology and cardiothoracic surgery to manage his Pleurx catheter. -- Follow-up cytology from thoracentesis- No malignant cells identified. Fluid from 12/11/2016 shows atypical cells present concerning for malignancy  -- Per CVTS, will start daily PleurX draining -- Follow-up medical oncology recommendations -- Follow-up cardiovascular and thoracic surgery recommendations -- Norco 5-325 mg 1-2 tabs q4h prn pain     Dispo: Anticipated discharge today.   Ophelia Shoulder, MD 12/26/2016, 8:32 AM

## 2016-12-27 ENCOUNTER — Telehealth: Payer: Self-pay | Admitting: Hematology

## 2016-12-27 ENCOUNTER — Other Ambulatory Visit: Payer: Medicaid Other

## 2016-12-27 DIAGNOSIS — C3482 Malignant neoplasm of overlapping sites of left bronchus and lung: Secondary | ICD-10-CM | POA: Diagnosis not present

## 2016-12-27 DIAGNOSIS — J910 Malignant pleural effusion: Secondary | ICD-10-CM | POA: Diagnosis not present

## 2016-12-27 NOTE — Telephone Encounter (Signed)
Hosp f/u appt has been scheduled for the pt to see Dr. Irene Limbo on 3/13 at 1:30pm. Pt aware to arrive 30 minutes early. Voiced understanding and agreed to the appt date and time.

## 2016-12-29 ENCOUNTER — Other Ambulatory Visit: Payer: Self-pay | Admitting: Cardiothoracic Surgery

## 2016-12-29 DIAGNOSIS — J90 Pleural effusion, not elsewhere classified: Secondary | ICD-10-CM

## 2017-01-02 ENCOUNTER — Other Ambulatory Visit: Payer: Self-pay | Admitting: *Deleted

## 2017-01-02 ENCOUNTER — Encounter (HOSPITAL_COMMUNITY): Payer: Self-pay

## 2017-01-02 DIAGNOSIS — G8918 Other acute postprocedural pain: Secondary | ICD-10-CM

## 2017-01-02 MED ORDER — HYDROCODONE-ACETAMINOPHEN 5-325 MG PO TABS: 1.0000 | ORAL_TABLET | ORAL | 0 refills | Status: DC | PRN

## 2017-01-04 ENCOUNTER — Encounter: Payer: Self-pay | Admitting: Cardiothoracic Surgery

## 2017-01-04 ENCOUNTER — Ambulatory Visit (INDEPENDENT_AMBULATORY_CARE_PROVIDER_SITE_OTHER): Payer: Self-pay | Admitting: Cardiothoracic Surgery

## 2017-01-04 ENCOUNTER — Ambulatory Visit
Admission: RE | Admit: 2017-01-04 | Discharge: 2017-01-04 | Disposition: A | Discharge status: Home / Self Care | Payer: Medicare Other | Source: Ambulatory Visit | Attending: Cardiothoracic Surgery | Admitting: Cardiothoracic Surgery

## 2017-01-04 VITALS — BP 110/64 | HR 86 | Resp 20 | Ht 69.0 in | Wt 135.0 lb

## 2017-01-04 DIAGNOSIS — J90 Pleural effusion, not elsewhere classified: Secondary | ICD-10-CM

## 2017-01-04 DIAGNOSIS — Z09 Encounter for follow-up examination after completed treatment for conditions other than malignant neoplasm: Secondary | ICD-10-CM

## 2017-01-04 NOTE — Progress Notes (Signed)
[address]       ,[place] [address]             [phone]      [facility] Medical Record #[id number] Date of Birth: 01-04-1951  Referring: Ophelia Shoulder, MD Primary Care: No PCP Per Patient  Chief Complaint:   POST OP FOLLOW UP 12/20/2016  OPERATIVE REPORT  PREOPERATIVE DIAGNOSIS:  Large left pleural effusion. 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 PROCEDURES PERFORMED:  Bronchoscopy, left video-assisted thoracoscopy, pleural biopsy, drainage of effusion, sterile talc pleurodesis, and placement of PleurX catheter. SURGEON:  Lanelle Bal, MD  History of Present Illness:     Patient returns today after recent diagnosis of lung cancer with a large left pleural effusion, patient had undergone bronchoscopy left video-assisted thoracoscopy with pleural biopsy and drainage of the effusion. A talc pleurodesis was performed. Pleurx catheter was placed with drainage. The patient was discharged home on daily drainage with very little drainage coming out, this was converted to every day drainage 3 with just scant drainage. Patient returns to the office today for follow-up to discuss removal of Pleurx and also due to get a follow-up chest x-ray. He has an appointment with oncology next week    Past Medical History:  Diagnosis Date  . 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 H/O orthostatic hypotension   . Pneumonia   . RBBB (right bundle branch block)   . Seizures ([place])      History  Smoking Status  . Former Smoker  . Packs/day: 2.00  . Years: 30.00  . Types: Cigars  Smokeless Tobacco  . Never Used    Comment: Quit ~2017    History  Alcohol Use No    Comment: Former use     Allergies  Allergen Reactions  . Tuberculin Purified Protein Derivative Swelling    ABSCESS  . Sulfa Antibiotics     Current Outpatient Prescriptions  Medication Sig Dispense Refill  . aspirin EC 81 MG tablet Take  162 mg by mouth daily.    [name] HYDROcodone-acetaminophen (NORCO/VICODIN) 5-325 MG tablet Take 1-2 tablets by mouth every 4 (four) hours as needed for severe pain. 30 tablet 0  . ibuprofen (ADVIL,MOTRIN) 800 MG tablet Take 1 tablet (800 mg total) by mouth every 8 (eight) hours as needed. 21 tablet 0  . Elbasvir-Grazoprevir (ZEPATIER) 50-100 MG TABS Take 1 tablet by mouth daily. (Patient not taking: Reported on 01/04/2017) 28 tablet 2   No current facility-administered medications for this visit.        Physical Exam: BP 110/64   Pulse 86   Resp 20   Ht '5\' 9"'$  (1.753 m)   Wt 135 lb (61.2 kg)   SpO2 98% Comment: RA  BMI 19.94 kg/m   General appearance: alert, cooperative and appears stated age Neurologic: intact Heart: regular rate and rhythm, S1, S2 normal, no murmur, click, rub or gallop Lungs: diminished breath sounds LLL Abdomen: soft, non-tender; bowel sounds normal; no masses,  no organomegaly Extremities: extremities normal, atraumatic, no cyanosis or edema and Homans sign is negative, no sign of DVT Wound: We drain the Pleurx in the office today and very little came out Patient's incisions are well-healed the sutures were removed.  The Pleurx was removed in the office today without difficulty after obtaining permission from the patient and applying topical lidocaine.  Diagnostic Studies & Laboratory data:     Recent Radiology Findings:  Dg Chest 2 View  Result Date: 01/04/2017 CLINICAL DATA:  History of left-sided VATS EXAM: CHEST  2 VIEW COMPARISON:  12/25/2016 FINDINGS: Left-sided pleural effusion again noted with left base collapse/ consolidation. The pleural air-fluid level seen on the left previously have resolved in the interval. Left pleural drain remains in place. Probable loculated pleural fluid in the left apex. Right lung remains clear. Heart size is stable. The visualized bony structures of the thorax are intact. IMPRESSION: 1. The scattered air-fluid level seen in the  left pleural space previously of resolved in the interval. 2. Persistent moderate left pleural effusion with left base collapse/consolidation. Probable loculated fluid towards the left apex. 3. Left pleural drain remains in place. Electronically Signed   By: Misty Stanley M.D.   On: 01/04/2017 12:08      Recent Lab Findings: Lab Results  Component Value Date   WBC 6.9 12/26/2016   HGB 9.6 (L) 12/26/2016   HCT 29.0 (L) 12/26/2016   PLT 312 12/26/2016   GLUCOSE 116 (H) 12/25/2016   CHOL  02/23/2011    130        ATP III CLASSIFICATION:  <200     mg/dL   Desirable  200-239  mg/dL   Borderline High  >=240    mg/dL   High          TRIG 123 02/23/2011   HDL 26 (L) 02/23/2011   LDLCALC  02/23/2011    79        Total Cholesterol/HDL:CHD Risk Coronary Heart Disease Risk Table                     Men   Women  1/2 Average Risk   3.4   3.3  Average Risk       5.0   4.4  2 X Average Risk   9.6   7.1  3 X Average Risk  23.4   11.0        Use the calculated Patient Ratio above and the CHD Risk Table to determine the patient's CHD Risk.        ATP III CLASSIFICATION (LDL):  <100     mg/dL   Optimal  100-129  mg/dL   Near or Above                    Optimal  130-159  mg/dL   Borderline  160-189  mg/dL   High  >190     mg/dL   Very High   ALT 10 (L) 12/22/2016   AST 19 12/22/2016   NA 133 (L) 12/25/2016   K 4.3 12/25/2016   CL 97 (L) 12/25/2016   CREATININE 0.78 12/25/2016   BUN 13 12/25/2016   CO2 26 12/25/2016   INR 1.16 12/19/2016   HGBA1C 4.9 12/22/2016      Assessment / Plan:      Patient with stage IV lung cancer with pleural involvement, status post talc pleurodesis on the left, Pleurx was removed today Clinically the patient is stable he notes his respiratory status is improved He has follow-up with oncology early next week I plan to see him back in 3 weeks with a follow-up chest x-ray      Grace Isaac MD      [address] [place] [address] Office [phone]   Beeper [phone]  01/04/2017 12:50 PM

## 2017-01-09 ENCOUNTER — Encounter: Payer: Self-pay | Admitting: *Deleted

## 2017-01-09 ENCOUNTER — Ambulatory Visit (HOSPITAL_BASED_OUTPATIENT_CLINIC_OR_DEPARTMENT_OTHER): Payer: Medicare Other | Admitting: Hematology

## 2017-01-09 ENCOUNTER — Telehealth: Payer: Self-pay | Admitting: Hematology

## 2017-01-09 ENCOUNTER — Encounter: Payer: Self-pay | Admitting: Hematology

## 2017-01-09 VITALS — BP 127/70 | HR 102 | Temp 97.6°F | Resp 16 | Wt 142.6 lb

## 2017-01-09 DIAGNOSIS — R630 Anorexia: Secondary | ICD-10-CM

## 2017-01-09 DIAGNOSIS — G893 Neoplasm related pain (acute) (chronic): Secondary | ICD-10-CM | POA: Diagnosis not present

## 2017-01-09 DIAGNOSIS — J910 Malignant pleural effusion: Secondary | ICD-10-CM

## 2017-01-09 DIAGNOSIS — D649 Anemia, unspecified: Secondary | ICD-10-CM

## 2017-01-09 DIAGNOSIS — E440 Moderate protein-calorie malnutrition: Secondary | ICD-10-CM

## 2017-01-09 DIAGNOSIS — C3492 Malignant neoplasm of unspecified part of left bronchus or lung: Secondary | ICD-10-CM

## 2017-01-09 DIAGNOSIS — Z7189 Other specified counseling: Secondary | ICD-10-CM

## 2017-01-09 MED ORDER — ONDANSETRON HCL 8 MG PO TABS: 8.0000 mg | ORAL_TABLET | Freq: Two times a day (BID) | ORAL | 1 refills | Status: DC | PRN

## 2017-01-09 MED ORDER — SENNOSIDES-DOCUSATE SODIUM 8.6-50 MG PO TABS: 2.0000 | ORAL_TABLET | Freq: Two times a day (BID) | ORAL | 2 refills | Status: DC

## 2017-01-09 MED ORDER — POLYETHYLENE GLYCOL 3350 17 G PO PACK: 17.0000 g | PACK | Freq: Every day | ORAL | 1 refills | Status: DC

## 2017-01-09 MED ORDER — FOLIC ACID 1 MG PO TABS: 1.0000 mg | ORAL_TABLET | Freq: Every day | ORAL | 3 refills | Status: DC

## 2017-01-09 MED ORDER — PROCHLORPERAZINE MALEATE 10 MG PO TABS: 10.0000 mg | ORAL_TABLET | Freq: Four times a day (QID) | ORAL | 1 refills | Status: DC | PRN

## 2017-01-09 MED ORDER — DEXAMETHASONE 4 MG PO TABS: ORAL_TABLET | ORAL | 1 refills | Status: DC

## 2017-01-09 MED ORDER — DRONABINOL 5 MG PO CAPS: 5.0000 mg | ORAL_CAPSULE | Freq: Two times a day (BID) | ORAL | 0 refills | Status: DC

## 2017-01-09 MED ORDER — OXYCODONE HCL 5 MG PO TABS: 5.0000 mg | ORAL_TABLET | ORAL | 0 refills | Status: DC | PRN

## 2017-01-09 MED ORDER — OXYCODONE HCL ER 10 MG PO T12A
10.0000 mg | EXTENDED_RELEASE_TABLET | Freq: Two times a day (BID) | ORAL | 0 refills | Status: DC
Start: 2017-01-09 — End: 2017-01-25

## 2017-01-09 NOTE — Progress Notes (Signed)
Oncology Nurse Navigator Documentation  Oncology Nurse Navigator Flowsheets 01/09/2017  Navigator Location [facility]  Navigator Encounter Type Clinic/MDC/I spoke with Joshua White and his family today.  He is newly DX with stage IV lung cancer.  He will be getting chemotherapy for his treatment.  He would like to CSW regarding advance directives.  I notified Loren Racer regarding request.    Patient Visit Type MedOnc  Treatment Phase Pre-Tx/Tx Discussion  Barriers/Navigation Needs Coordination of Care;Education  Education Other  Interventions Coordination of Care;Education  Coordination of Care Other  Education Method Verbal  Acuity Level 2  Time Spent with Patient 15

## 2017-01-09 NOTE — Telephone Encounter (Signed)
Gave relative avs report and appointments for March and April. Patient will have lab/inj at [facility] 3/16 and PRBC's at [facility] after. Spoke with Ander Purpura re social work referral - social work will call patient.

## 2017-01-09 NOTE — Progress Notes (Signed)
Gave patient and family new patient packet.  Information on lung cancer and resources at [facility].

## 2017-01-09 NOTE — Progress Notes (Signed)
START ON PATHWAY REGIMEN - Non-Small Cell Lung     A cycle is every 21 days:     Pemetrexed      Carboplatin   **Always confirm dose/schedule in your pharmacy ordering system**    Patient Characteristics: Stage IV Metastatic, Non Squamous, Initial Chemotherapy/Immunotherapy, PS = 2 AJCC T Category: TX Current Disease Status: Distant Metastases AJCC N Category: NX AJCC M Category: M1a AJCC 8 Stage Grouping: IVA Histology: Non Squamous Cell ROS1 Rearrangement Status: Quantity Not Sufficient T790M Mutation Status: Not Applicable - EGFR Mutation Negative/Unknown Other Mutations/Biomarkers: No Other Actionable Mutations PD-L1 Expression Status: PD-L1 Negative Chemotherapy/Immunotherapy LOT: Initial Chemotherapy/Immunotherapy Molecular Targeted Therapy: Not Appropriate ALK Translocation Status: Quantity Not Sufficient Would you be surprised if this patient died  in the next year? I would NOT be surprised if this patient died in the next year EGFR Mutation Status: Quantity Not Sufficient BRAF V600E Mutation Status: Quantity Not Sufficient Performance Status: PS = 2  Intent of Therapy: Non-Curative / Palliative Intent, Discussed with Patient

## 2017-01-10 ENCOUNTER — Encounter: Payer: Self-pay | Admitting: Hematology

## 2017-01-10 ENCOUNTER — Encounter: Payer: Self-pay | Admitting: *Deleted

## 2017-01-10 NOTE — Progress Notes (Signed)
Received questionnaire back from RN for Oxycodone. Faxed to [facility]. Fax received ok per confirmation sheet.

## 2017-01-10 NOTE — Progress Notes (Signed)
Received PA request for Oxycodone from [place]. Called [facility] to obtain clinical questionnaire faxed. Received questionnaire and gave to RN to complete and have physician sign and return to me to fax.

## 2017-01-10 NOTE — Progress Notes (Signed)
[place] Work  Clinical Social Work was referred by patient navigator for assessment of psychosocial needs, assistance with ADRs paperwork.  Clinical Social Worker contacted patient at home to offer support and assess for needs.  Pt is interested in completing ADRs and open to meeting with CSW on 01/12/17 when he returns to the center. CSW to meet and review packet on that date.    Clinical Social Work interventions:  ADR education  Loren Racer, [place], [place] Clinical Social Worker [place]  [place] Phone: [phone] Fax: [phone]

## 2017-01-11 ENCOUNTER — Encounter: Payer: Self-pay | Admitting: Pharmacist

## 2017-01-11 DIAGNOSIS — Z7189 Other specified counseling: Secondary | ICD-10-CM | POA: Insufficient documentation

## 2017-01-12 ENCOUNTER — Encounter: Payer: Self-pay | Admitting: *Deleted

## 2017-01-12 ENCOUNTER — Other Ambulatory Visit (HOSPITAL_BASED_OUTPATIENT_CLINIC_OR_DEPARTMENT_OTHER): Payer: Medicare Other

## 2017-01-12 ENCOUNTER — Ambulatory Visit (HOSPITAL_COMMUNITY)
Admission: RE | Admit: 2017-01-12 | Discharge: 2017-01-12 | Disposition: A | Discharge status: Home / Self Care | Payer: Medicare Other | Source: Ambulatory Visit | Attending: Hematology | Admitting: Hematology

## 2017-01-12 ENCOUNTER — Ambulatory Visit (HOSPITAL_COMMUNITY)
Admission: RE | Admit: 2017-01-12 | Discharge: 2017-01-12 | Disposition: A | Discharge status: Home / Self Care | Payer: Medicare Other | Source: Ambulatory Visit

## 2017-01-12 ENCOUNTER — Ambulatory Visit (HOSPITAL_BASED_OUTPATIENT_CLINIC_OR_DEPARTMENT_OTHER): Payer: Medicare Other

## 2017-01-12 ENCOUNTER — Encounter (HOSPITAL_COMMUNITY): Payer: Self-pay

## 2017-01-12 VITALS — BP 140/65 | HR 80 | Temp 97.4°F | Resp 18

## 2017-01-12 DIAGNOSIS — D649 Anemia, unspecified: Secondary | ICD-10-CM

## 2017-01-12 DIAGNOSIS — C3492 Malignant neoplasm of unspecified part of left bronchus or lung: Secondary | ICD-10-CM

## 2017-01-12 DIAGNOSIS — B182 Chronic viral hepatitis C: Secondary | ICD-10-CM

## 2017-01-12 LAB — COMPREHENSIVE METABOLIC PANEL
ALT: 8 U/L (ref 0–55)
ANION GAP: 8 meq/L (ref 3–11)
AST: 13 U/L (ref 5–34)
Albumin: 2.8 g/dL — ABNORMAL LOW (ref 3.5–5.0)
Alkaline Phosphatase: 77 U/L (ref 40–150)
BILIRUBIN TOTAL: 0.52 mg/dL (ref 0.20–1.20)
BUN: 12.2 mg/dL (ref 7.0–26.0)
CHLORIDE: 102 meq/L (ref 98–109)
CO2: 25 meq/L (ref 22–29)
Calcium: 10.2 mg/dL (ref 8.4–10.4)
Creatinine: 0.8 mg/dL (ref 0.7–1.3)
GLUCOSE: 81 mg/dL (ref 70–140)
POTASSIUM: 4.3 meq/L (ref 3.5–5.1)
SODIUM: 135 meq/L — AB (ref 136–145)
TOTAL PROTEIN: 8.6 g/dL — AB (ref 6.4–8.3)

## 2017-01-12 LAB — CBC & DIFF AND RETIC
BASO%: 0.2 % (ref 0.0–2.0)
Basophils Absolute: 0 10*3/uL (ref 0.0–0.1)
EOS%: 0.7 % (ref 0.0–7.0)
Eosinophils Absolute: 0 10*3/uL (ref 0.0–0.5)
HCT: 30.3 % — ABNORMAL LOW (ref 38.4–49.9)
HGB: 9.7 g/dL — ABNORMAL LOW (ref 13.0–17.1)
IMMATURE RETIC FRACT: 6.2 % (ref 3.00–10.60)
LYMPH#: 1.2 10*3/uL (ref 0.9–3.3)
LYMPH%: 21.5 % (ref 14.0–49.0)
MCH: 31.8 pg (ref 27.2–33.4)
MCHC: 32 g/dL (ref 32.0–36.0)
MCV: 99.3 fL — ABNORMAL HIGH (ref 79.3–98.0)
MONO#: 0.7 10*3/uL (ref 0.1–0.9)
MONO%: 13.1 % (ref 0.0–14.0)
NEUT%: 64.5 % (ref 39.0–75.0)
NEUTROS ABS: 3.6 10*3/uL (ref 1.5–6.5)
Platelets: 290 10*3/uL (ref 140–400)
RBC: 3.05 10*6/uL — ABNORMAL LOW (ref 4.20–5.82)
RDW: 13.6 % (ref 11.0–14.6)
RETIC %: 2.11 % — AB (ref 0.80–1.80)
RETIC CT ABS: 64.36 10*3/uL (ref 34.80–93.90)
WBC: 5.5 10*3/uL (ref 4.0–10.3)

## 2017-01-12 LAB — ABO/RH: ABO/RH(D): A POS

## 2017-01-12 LAB — PREPARE RBC (CROSSMATCH)

## 2017-01-12 MED ORDER — SODIUM CHLORIDE 0.9% FLUSH: 10.0000 mL | INTRAVENOUS | Status: DC | PRN

## 2017-01-12 MED ORDER — SODIUM CHLORIDE 0.9% FLUSH
3.0000 mL | INTRAVENOUS | Status: DC | PRN
Start: 2017-01-12 — End: 2017-01-13

## 2017-01-12 MED ORDER — ACETAMINOPHEN 325 MG PO TABS
650.0000 mg | ORAL_TABLET | Freq: Once | ORAL | Status: AC
  Administered 2017-01-12: 650 mg via ORAL
  Filled 2017-01-12: qty 2

## 2017-01-12 MED ORDER — DIPHENHYDRAMINE HCL 25 MG PO CAPS
25.0000 mg | ORAL_CAPSULE | Freq: Once | ORAL | Status: AC
  Administered 2017-01-12: 25 mg via ORAL
  Filled 2017-01-12: qty 1

## 2017-01-12 MED ORDER — SODIUM CHLORIDE 0.9 % IV SOLN
250.0000 mL | Freq: Once | INTRAVENOUS | Status: AC
  Administered 2017-01-12: 250 mL via INTRAVENOUS

## 2017-01-12 MED ORDER — HEPARIN SOD (PORK) LOCK FLUSH 100 UNIT/ML IV SOLN: 500.0000 [IU] | Freq: Every day | INTRAVENOUS | Status: DC | PRN

## 2017-01-12 MED ORDER — CYANOCOBALAMIN 1000 MCG/ML IJ SOLN
1000.0000 ug | Freq: Once | INTRAMUSCULAR | Status: AC
  Administered 2017-01-12: 1000 ug via SUBCUTANEOUS

## 2017-01-12 MED ORDER — HEPARIN SOD (PORK) LOCK FLUSH 100 UNIT/ML IV SOLN: 250.0000 [IU] | INTRAVENOUS | Status: DC | PRN

## 2017-01-12 NOTE — Progress Notes (Signed)
[place] Work  Clinical Social Work was referred by patient navigator for assessment of psychosocial needs, assistance with ADRs paperwork.  Clinical Social Worker met with patient and son, Gabrian at [facility] to offer support and assess for needs.  Pt is interested in completing ADRs and CSW reviewed packet and community resources to assist with other possible needs; transportation. Pt plans to review packet and reach out to CSW when ready to get notarized. Pt and family provided with packet and resources.   Clinical Social Work interventions:  Resource education and referral  Loren Racer, LCSW, OSW-C Clinical Social Worker [place]  [place] Phone: [phone] Fax: [phone]

## 2017-01-12 NOTE — Progress Notes (Signed)
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 Irene Limbo  Procedure: Pt received 1 unit of prbcs.  Pt tolerated procedure well.  Post procedure: Pt alert, oriented and ambulatory at discharge. Discharge instructions given with verbal understanding.

## 2017-01-12 NOTE — Patient Instructions (Signed)
Cyanocobalamin, Vitamin B12 injection What is this medicine? CYANOCOBALAMIN (sye an oh koe BAL a min) is a man made form of vitamin B12. Vitamin B12 is used in the growth of healthy blood cells, nerve cells, and proteins in the body. It also helps with the metabolism of fats and carbohydrates. This medicine is used to treat people who can not absorb vitamin B12. This medicine may be used for other purposes; ask your health care provider or pharmacist if you have questions. COMMON BRAND NAME(S): B-12 Compliance Kit, B-12 Injection Kit, Cyomin, LA-12, Nutri-Twelve, Physicians EZ Use B-12, Primabalt What should I tell my health care provider before I take this medicine? They need to know if you have any of these conditions: -kidney disease -Leber's disease -megaloblastic anemia -an unusual or allergic reaction to cyanocobalamin, cobalt, other medicines, foods, dyes, or preservatives -pregnant or trying to get pregnant -breast-feeding How should I use this medicine? This medicine is injected into a muscle or deeply under the skin. It is usually given by a health care professional in a clinic or doctor's office. However, your doctor may teach you how to inject yourself. Follow all instructions. Talk to your pediatrician regarding the use of this medicine in children. Special care may be needed. Overdosage: If you think you have taken too much of this medicine contact a poison control center or emergency room at once. NOTE: This medicine is only for you. Do not share this medicine with others. What if I miss a dose? If you are given your dose at a clinic or doctor's office, call to reschedule your appointment. If you give your own injections and you miss a dose, take it as soon as you can. If it is almost time for your next dose, take only that dose. Do not take double or extra doses. What may interact with this medicine? -colchicine -heavy alcohol intake This list may not describe all possible  interactions. Give your health care provider a list of all the medicines, herbs, non-prescription drugs, or dietary supplements you use. Also tell them if you smoke, drink alcohol, or use illegal drugs. Some items may interact with your medicine. What should I watch for while using this medicine? Visit your doctor or health care professional regularly. You may need blood work done while you are taking this medicine. You may need to follow a special diet. Talk to your doctor. Limit your alcohol intake and avoid smoking to get the best benefit. What side effects may I notice from receiving this medicine? Side effects that you should report to your doctor or health care professional as soon as possible: -allergic reactions like skin rash, itching or hives, swelling of the face, lips, or tongue -blue tint to skin -chest tightness, pain -difficulty breathing, wheezing -dizziness -red, swollen painful area on the leg Side effects that usually do not require medical attention (report to your doctor or health care professional if they continue or are bothersome): -diarrhea -headache This list may not describe all possible side effects. Call your doctor for medical advice about side effects. You may report side effects to FDA at 1-800-FDA-1088. Where should I keep my medicine? Keep out of the reach of children. Store at room temperature between 15 and 30 degrees C (59 and 85 degrees F). Protect from light. Throw away any unused medicine after the expiration date. NOTE: This sheet is a summary. It may not cover all possible information. If you have questions about this medicine, talk to your doctor, pharmacist, or   health care provider.  2018 Elsevier/Gold Standard (2008-01-27 22:10:20)  

## 2017-01-15 ENCOUNTER — Other Ambulatory Visit: Payer: Medicare Other

## 2017-01-15 LAB — TYPE AND SCREEN
ABO/RH(D): A POS
ANTIBODY SCREEN: NEGATIVE
UNIT DIVISION: 0

## 2017-01-15 LAB — BPAM RBC
Blood Product Expiration Date: 201804092359
ISSUE DATE / TIME: 201803161007
UNIT TYPE AND RH: 6200

## 2017-01-16 ENCOUNTER — Encounter: Payer: Self-pay | Admitting: *Deleted

## 2017-01-16 ENCOUNTER — Ambulatory Visit: Payer: Medicare Other | Admitting: Nutrition

## 2017-01-16 ENCOUNTER — Ambulatory Visit (HOSPITAL_BASED_OUTPATIENT_CLINIC_OR_DEPARTMENT_OTHER): Payer: Medicare Other

## 2017-01-16 ENCOUNTER — Other Ambulatory Visit: Payer: Self-pay | Admitting: *Deleted

## 2017-01-16 ENCOUNTER — Other Ambulatory Visit: Payer: Self-pay | Admitting: Hematology

## 2017-01-16 VITALS — BP 131/77 | HR 81 | Temp 97.7°F | Resp 18

## 2017-01-16 DIAGNOSIS — C3492 Malignant neoplasm of unspecified part of left bronchus or lung: Secondary | ICD-10-CM

## 2017-01-16 DIAGNOSIS — Z7189 Other specified counseling: Secondary | ICD-10-CM

## 2017-01-16 DIAGNOSIS — Z5111 Encounter for antineoplastic chemotherapy: Secondary | ICD-10-CM

## 2017-01-16 DIAGNOSIS — Z5189 Encounter for other specified aftercare: Secondary | ICD-10-CM | POA: Diagnosis not present

## 2017-01-16 MED ORDER — PROCHLORPERAZINE MALEATE 10 MG PO TABS: 10.0000 mg | ORAL_TABLET | Freq: Four times a day (QID) | ORAL | 1 refills | Status: AC | PRN

## 2017-01-16 MED ORDER — SODIUM CHLORIDE 0.9 % IV SOLN
453.0000 mg | Freq: Once | INTRAVENOUS | Status: AC
  Administered 2017-01-16: 450 mg via INTRAVENOUS
  Filled 2017-01-16: qty 45

## 2017-01-16 MED ORDER — DEXAMETHASONE SODIUM PHOSPHATE 10 MG/ML IJ SOLN
10.0000 mg | Freq: Once | INTRAMUSCULAR | Status: AC
  Administered 2017-01-16: 10 mg via INTRAVENOUS

## 2017-01-16 MED ORDER — FOLIC ACID 1 MG PO TABS: 1.0000 mg | ORAL_TABLET | Freq: Every day | ORAL | 3 refills | Status: DC

## 2017-01-16 MED ORDER — PALONOSETRON HCL INJECTION 0.25 MG/5ML
INTRAVENOUS | Status: AC
  Filled 2017-01-16: qty 5

## 2017-01-16 MED ORDER — PEMETREXED DISODIUM CHEMO INJECTION 500 MG
500.0000 mg/m2 | Freq: Once | INTRAVENOUS | Status: AC
  Administered 2017-01-16: 875 mg via INTRAVENOUS
  Filled 2017-01-16: qty 20

## 2017-01-16 MED ORDER — DEXAMETHASONE 4 MG PO TABS: ORAL_TABLET | ORAL | 1 refills | Status: DC

## 2017-01-16 MED ORDER — PEGFILGRASTIM 6 MG/0.6ML ~~LOC~~ PSKT
6.0000 mg | PREFILLED_SYRINGE | Freq: Once | SUBCUTANEOUS | Status: AC
  Administered 2017-01-16: 6 mg via SUBCUTANEOUS
  Filled 2017-01-16: qty 0.6

## 2017-01-16 MED ORDER — PALONOSETRON HCL INJECTION 0.25 MG/5ML
0.2500 mg | Freq: Once | INTRAVENOUS | Status: AC
  Administered 2017-01-16: 0.25 mg via INTRAVENOUS

## 2017-01-16 MED ORDER — DEXAMETHASONE SODIUM PHOSPHATE 10 MG/ML IJ SOLN
INTRAMUSCULAR | Status: AC
  Filled 2017-01-16: qty 1

## 2017-01-16 MED ORDER — SODIUM CHLORIDE 0.9 % IV SOLN
Freq: Once | INTRAVENOUS | Status: AC
  Administered 2017-01-16: 09:00:00 via INTRAVENOUS

## 2017-01-16 NOTE — Progress Notes (Signed)
Per Arbie Cookey in pharmacy okay for patient to receive treatment today without having taken Folic Acid or Decadron prior to treatment; patient will need to start taking prescription today after treatment. Patient and patient's son aware and verbalized understanding.   Discharge and neulasta instructions reviewed with patient and patient's caregiver, both verbalized understanding. Patient stable upon discharge.

## 2017-01-16 NOTE — Progress Notes (Signed)
[facility] Social Work  Clinical Social Work was referred by patient to review and complete healthcare advance directives.  Clinical Social Worker met with patient in the infusion room at [facility].  The patient designated Joshua White as their primary healthcare agent and [name] as their secondary agent.  Patient also completed healthcare living will.    Clinical Social Worker notarized documents and made copies for patient/family. Clinical Social Worker will send documents to medical records to be scanned into patient's chart. Clinical Social Worker encouraged patient/family to contact with any additional questions or concerns.  [name], MSW, LCSW, OSW-C Clinical Social Worker [facility] [phone]

## 2017-01-16 NOTE — Progress Notes (Signed)
66 year old male diagnosed with stage IV lung cancer.  He is a patient of Dr. Irene Limbo.  Past medical history includes seizures, orthostatic hypotension, tobacco, alcohol and hep C.  Medications include Decadron, Marinol, Zofran, MiraLAX, Compazine, and Senokot-S.  Labs include sodium 135 and albumin 2.8.  Height: 5 feet 9 inches. Weight: 142.6 pounds March 13. Usual body weight: 153 pounds November 2017. BMI: 21.06.  Patient diagnosed with severe malnutrition, during hospital stay. Patient reports his appetite and oral intake have improved since discharge. He has some nausea but no vomiting. Reports nausea medication is effective. Patient reports history of constipation which is now resolved as he is on a bowel regimen. Reports he was told to stop drinking oral nutrition supplements because they cause constipation.  Nutrition diagnosis:  Food and nutrition related knowledge deficit related to stage IV lung cancer and associated treatments as evidenced by no prior need for nutrition related information.  Intervention: I educated patient to consume small frequent meals and snacks utilizing high-calorie, high-protein foods and provided a fact sheet. Recommended patient take nausea medication when he has nausea. Reviewed strategies for avoiding constipation, during treatment. Provided samples of ensure enlive. Recommended he start with one daily. Questions were answered.  Teach back method used.  Contact information given.  Monitoring, evaluation, goals:  Patient will work to increase calories and protein to minimize weight loss.  Next visit: To be scheduled as needed with treatment.  **Disclaimer: This note was dictated with voice recognition software. Similar sounding words can inadvertently be transcribed and this note may contain transcription errors which may not have been corrected upon publication of note.**

## 2017-01-16 NOTE — Patient Instructions (Signed)
[place] Discharge Instructions for Patients Receiving Chemotherapy  Today you received the following chemotherapy agents: Alimta and Carboplatin  To help prevent nausea and vomiting after your treatment, we encourage you to take your nausea medication as directed.    If you develop nausea and vomiting that is not controlled by your nausea medication, call the clinic.   BELOW ARE SYMPTOMS THAT SHOULD BE REPORTED IMMEDIATELY:  *FEVER GREATER THAN 100.5 F  *CHILLS WITH OR WITHOUT FEVER  NAUSEA AND VOMITING THAT IS NOT CONTROLLED WITH YOUR NAUSEA MEDICATION  *UNUSUAL SHORTNESS OF BREATH  *UNUSUAL BRUISING OR BLEEDING  TENDERNESS IN MOUTH AND THROAT WITH OR WITHOUT PRESENCE OF ULCERS  *URINARY PROBLEMS  *BOWEL PROBLEMS  UNUSUAL RASH Items with * indicate a potential emergency and should be followed up as soon as possible.  Feel free to call the clinic you have any questions or concerns. The clinic phone number is [phone].  Please show the [place] at check-in to the Emergency Department and triage nurse.  Pemetrexed injection (Alimta) What is this medicine? PEMETREXED (PEM e TREX ed) is a chemotherapy drug used to treat lung cancers like non-small cell lung cancer and mesothelioma. It may also be used to treat other cancers. This medicine may be used for other purposes; ask your health care provider or pharmacist if you have questions. COMMON BRAND NAME(S): Alimta What should I tell my health care provider before I take this medicine? They need to know if you have any of these conditions: -infection (especially a virus infection such as chickenpox, cold sores, or herpes) -kidney disease -low blood counts, like low white cell, platelet, or red cell counts -lung or breathing disease, like asthma -radiation therapy -an unusual or allergic reaction to pemetrexed, other medicines, foods, dyes, or preservative -pregnant or trying to get  pregnant -breast-feeding How should I use this medicine? This drug is given as an infusion into a vein. It is administered in a hospital or clinic by a specially trained health care professional. Talk to your pediatrician regarding the use of this medicine in children. Special care may be needed. Overdosage: If you think you have taken too much of this medicine contact a poison control center or emergency room at once. NOTE: This medicine is only for you. Do not share this medicine with others. What if I miss a dose? It is important not to miss your dose. Call your doctor or health care professional if you are unable to keep an appointment. What may interact with this medicine? This medicine may interact with the following medications: -Ibuprofen This list may not describe all possible interactions. Give your health care provider a list of all the medicines, herbs, non-prescription drugs, or dietary supplements you use. Also tell them if you smoke, drink alcohol, or use illegal drugs. Some items may interact with your medicine. What should I watch for while using this medicine? Visit your doctor for checks on your progress. This drug may make you feel generally unwell. This is not uncommon, as chemotherapy can affect healthy cells as well as cancer cells. Report any side effects. Continue your course of treatment even though you feel ill unless your doctor tells you to stop. In some cases, you may be given additional medicines to help with side effects. Follow all directions for their use. Call your doctor or health care professional for advice if you get a fever, chills or sore throat, or other symptoms of a cold or flu. Do  not treat yourself. This drug decreases your body's ability to fight infections. Try to avoid being around people who are sick. This medicine may increase your risk to bruise or bleed. Call your doctor or health care professional if you notice any unusual bleeding. Be careful  brushing and flossing your teeth or using a toothpick because you may get an infection or bleed more easily. If you have any dental work done, tell your dentist you are receiving this medicine. Avoid taking products that contain aspirin, acetaminophen, ibuprofen, naproxen, or ketoprofen unless instructed by your doctor. These medicines may hide a fever. Call your doctor or health care professional if you get diarrhea or mouth sores. Do not treat yourself. To protect your kidneys, drink water or other fluids as directed while you are taking this medicine. Do not become pregnant while taking this medicine or for 6 months after stopping it. Women should inform their doctor if they wish to become pregnant or think they might be pregnant. Men should not father a child while taking this medicine and for 3 months after stopping it. This may interfere with the ability to father a child. You should talk to your doctor or health care professional if you are concerned about your fertility. There is a potential for serious side effects to an unborn child. Talk to your health care professional or pharmacist for more information. Do not breast-feed an infant while taking this medicine or for 1 week after stopping it. What side effects may I notice from receiving this medicine? Side effects that you should report to your doctor or health care professional as soon as possible: -allergic reactions like skin rash, itching or hives, swelling of the face, lips, or tongue -breathing problems -redness, blistering, peeling or loosening of the skin, including inside the mouth -signs and symptoms of bleeding such as bloody or black, tarry stools; red or dark-brown urine; spitting up blood or brown material that looks like coffee grounds; red spots on the skin; unusual bruising or bleeding from the eye, gums, or nose -signs and symptoms of infection like fever or chills; cough; sore throat; pain or trouble passing urine -signs  and symptoms of kidney injury like trouble passing urine or change in the amount of urine -signs and symptoms of liver injury like dark yellow or brown urine; general ill feeling or flu-like symptoms; light-colored stools; loss of appetite; nausea; right upper belly pain; unusually weak or tired; yellowing of the eyes or skin Side effects that usually do not require medical attention (report to your doctor or health care professional if they continue or are bothersome): -constipation -dizziness -mouth sores -nausea, vomiting -pain, tingling, numbness in the hands or feet -unusually weak or tired This list may not describe all possible side effects. Call your doctor for medical advice about side effects. You may report side effects to FDA at 1-800-FDA-1088. Where should I keep my medicine? This drug is given in a hospital or clinic and will not be stored at home. NOTE: This sheet is a summary. It may not cover all possible information. If you have questions about this medicine, talk to your doctor, pharmacist, or health care provider.  2018 Elsevier/Gold Standard (2016-08-15 18:51:46)   Carboplatin injection What is this medicine? CARBOPLATIN (KAR boe pla tin) is a chemotherapy drug. It targets fast dividing cells, like cancer cells, and causes these cells to die. This medicine is used to treat ovarian cancer and many other cancers. This medicine may be used for other purposes;  ask your health care provider or pharmacist if you have questions. COMMON BRAND NAME(S): Paraplatin What should I tell my health care provider before I take this medicine? They need to know if you have any of these conditions: -blood disorders -hearing problems -kidney disease -recent or ongoing radiation therapy -an unusual or allergic reaction to carboplatin, cisplatin, other chemotherapy, other medicines, foods, dyes, or preservatives -pregnant or trying to get pregnant -breast-feeding How should I use this  medicine? This drug is usually given as an infusion into a vein. It is administered in a hospital or clinic by a specially trained health care professional. Talk to your pediatrician regarding the use of this medicine in children. Special care may be needed. Overdosage: If you think you have taken too much of this medicine contact a poison control center or emergency room at once. NOTE: This medicine is only for you. Do not share this medicine with others. What if I miss a dose? It is important not to miss a dose. Call your doctor or health care professional if you are unable to keep an appointment. What may interact with this medicine? -medicines for seizures -medicines to increase blood counts like filgrastim, pegfilgrastim, sargramostim -some antibiotics like amikacin, gentamicin, neomycin, streptomycin, tobramycin -vaccines Talk to your doctor or health care professional before taking any of these medicines: -acetaminophen -aspirin -ibuprofen -ketoprofen -naproxen This list may not describe all possible interactions. Give your health care provider a list of all the medicines, herbs, non-prescription drugs, or dietary supplements you use. Also tell them if you smoke, drink alcohol, or use illegal drugs. Some items may interact with your medicine. What should I watch for while using this medicine? Your condition will be monitored carefully while you are receiving this medicine. You will need important blood work done while you are taking this medicine. This drug may make you feel generally unwell. This is not uncommon, as chemotherapy can affect healthy cells as well as cancer cells. Report any side effects. Continue your course of treatment even though you feel ill unless your doctor tells you to stop. In some cases, you may be given additional medicines to help with side effects. Follow all directions for their use. Call your doctor or health care professional for advice if you get a  fever, chills or sore throat, or other symptoms of a cold or flu. Do not treat yourself. This drug decreases your body's ability to fight infections. Try to avoid being around people who are sick. This medicine may increase your risk to bruise or bleed. Call your doctor or health care professional if you notice any unusual bleeding. Be careful brushing and flossing your teeth or using a toothpick because you may get an infection or bleed more easily. If you have any dental work done, tell your dentist you are receiving this medicine. Avoid taking products that contain aspirin, acetaminophen, ibuprofen, naproxen, or ketoprofen unless instructed by your doctor. These medicines may hide a fever. Do not become pregnant while taking this medicine. Women should inform their doctor if they wish to become pregnant or think they might be pregnant. There is a potential for serious side effects to an unborn child. Talk to your health care professional or pharmacist for more information. Do not breast-feed an infant while taking this medicine. What side effects may I notice from receiving this medicine? Side effects that you should report to your doctor or health care professional as soon as possible: -allergic reactions like skin rash, itching or hives,  swelling of the face, lips, or tongue -signs of infection - fever or chills, cough, sore throat, pain or difficulty passing urine -signs of decreased platelets or bleeding - bruising, pinpoint red spots on the skin, black, tarry stools, nosebleeds -signs of decreased red blood cells - unusually weak or tired, fainting spells, lightheadedness -breathing problems -changes in hearing -changes in vision -chest pain -high blood pressure -low blood counts - This drug may decrease the number of white blood cells, red blood cells and platelets. You may be at increased risk for infections and bleeding. -nausea and vomiting -pain, swelling, redness or irritation at the  injection site -pain, tingling, numbness in the hands or feet -problems with balance, talking, walking -trouble passing urine or change in the amount of urine Side effects that usually do not require medical attention (report to your doctor or health care professional if they continue or are bothersome): -hair loss -loss of appetite -metallic taste in the mouth or changes in taste This list may not describe all possible side effects. Call your doctor for medical advice about side effects. You may report side effects to FDA at 1-800-FDA-1088. Where should I keep my medicine? This drug is given in a hospital or clinic and will not be stored at home. NOTE: This sheet is a summary. It may not cover all possible information. If you have questions about this medicine, talk to your doctor, pharmacist, or health care provider.  2018 Elsevier/Gold Standard (2008-01-21 14:38:05)

## 2017-01-17 ENCOUNTER — Telehealth: Payer: Self-pay

## 2017-01-17 NOTE — Telephone Encounter (Signed)
Pt a little nasueated and a little more sore. He is using compazine and pain medication with good control. He is drinking 2 bottles water, ginger tea and coffee today and is able to eat. Moving bowels well. He was appreciative of the call and the encouragement it gave him.

## 2017-01-17 NOTE — Telephone Encounter (Signed)
-----   Message from Beatriz Chancellor, RN sent at 01/16/2017 11:18 AM EDT ----- Regarding: Dr. Doreatha Massed Follow Up Call Contact: [phone] Patient of Dr. Irene Limbo first time Alimta and Carboplatin. Patient tolerated treatment well.

## 2017-01-19 ENCOUNTER — Other Ambulatory Visit: Payer: Self-pay | Admitting: Cardiothoracic Surgery

## 2017-01-19 DIAGNOSIS — C3490 Malignant neoplasm of unspecified part of unspecified bronchus or lung: Secondary | ICD-10-CM

## 2017-01-22 NOTE — Progress Notes (Signed)
[name]  HEMATOLOGY ONCOLOGY PROGRESS NOTE  Date of service: .01/09/2017  Patient Care Team: No Pcp Per Patient as PCP - General (General Practice)  CC: f/u for metastatic Non small cell lung cancer  Diagnosis: Newly diagnosed Metastatic lung adenocarcinoma (with M1a disease - pleural nodules and malignant pleural effusion)  Current Treatment: planning to start Carboplatin + Alimta  HPI  Joshua White is a 66 y.o. male who has been referred to [place] by Dr Aldine Contes, MD for evaluation and management of likely metastatic lung cancer.  Patient has a history of seizures, G6PD deficiency, right bundle branch block , heavy smoking history and hepatitis C and is just about done with his 12 week treatment with Harvoni for his hepatitis C. Patient notes that he has had progressive fatigue and anorexia for the last 2-3 months. He initially assigned his symptoms to his treatment for hepatitis C with Harvoni but was concerned when the symptoms persisted. He notes he has lost about 15-20 pounds over the last couple of months. She presented to the emergency room about a week ago with significant shortness of breath for 2-3 weeks  and was noted to have a large pleural effusion and had a therapeutic thoracentesis with removal of 1 L fluid which was sent for analysis. It was noted to be exudative with cytology concerning for atypical cells which are likely malignant.  Patient presented to his primary care physician in follow-up and was noted to have significant shortness of breath again and recurrent large left-sided pleural effusion for which she was admitted. He had a CT of the chest which showed an extremely large left-sided pleural effusion occupying majority of the left hemithorax with dense consolidation of most of the left lung. Diffuse infiltrating mass along the left side of the mediastinum measuring 3.6 cm and extending into much of the pleura and left hemithorax . He was also noted to have  mediastinal lymphadenopathy and a 2.1 cm nodular opacity in the right lung . Concern for possible metastases in the epicardial fat pad . Also had mildly prominent retroperitoneal lymph nodes measuring about 8 mm which were nonspecific . CT abdomen showed no other obvious metastatic disease.  Patient had an MRI of the brain which shows no evidence of metastatic disease. 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  Patient has had a repeat therapeutic thoracentesis today cytology from this is currently pending. Cardiothoracic surgery was consulted for his recurrent large pleural effusion and there are plans for a VATS procedure with pleural biopsy and possible placement of a Pleurx catheter on 12/20/2016.  Patient notes his breathing is somewhat better after the thoracentesis. Has some left sided chest pain.  He notes that he smoked about 1-2 packs a day since age 36 and quit about a year ago. No headaches no focal neurological deficits. No abdominal pain or distention. Weight loss of about 20 pounds over the last couple months.  Pathology results showed poorly differentiated lung adenocarcinoma.  PDL1 testing 0% Other foundation One results pending  INTERVAL HISTORY:  Patient is here for his posthospitalization follow-up with his son. He notes that his breathing has improved. His left-sided Pleurx catheter has been removed since it was not draining much. Notes significant fatigue but is functioning okay at home ECOG performance status of 2. We discussed his diagnosis, natural history, prognosis and palliative nature of treatment options. We discussed best supportive cares versus palliative systemic therapies and he wants to pursue the  latter currently.  Having left-sided chest wall pain that is persistent and needing pain medications every 4-6 hours. Okay with starting long-acting pain medication. Notes poor appetite given prescription for  Marinol.  REVIEW OF SYSTEMS:    10 Point review of systems of done and is negative except as noted above.  . Past Medical History:  Diagnosis Date  . G6PD deficiency ([place])   . H/O orthostatic hypotension   . Pneumonia   . RBBB (right bundle branch block)   . Seizures ([place])     . Past Surgical History:  Procedure Laterality Date  . APPENDECTOMY    . CHEST TUBE INSERTION Left 12/20/2016   Procedure: INSERTION PLEURAL DRAINAGE CATHETER;  Surgeon: Grace Isaac, MD;  Location: [place];  Service: Thoracic;  Laterality: Left;  . PLEURAL BIOPSY Left 12/20/2016   Procedure: PLEURAL BIOPSY;  Surgeon: Grace Isaac, MD;  Location: [place];  Service: Thoracic;  Laterality: Left;  . US ECHOCARDIOGRAPHY  05/12/2008   EF 55-60%  . VIDEO ASSISTED THORACOSCOPY (VATS) W/TALC PLEUADESIS Left 12/20/2016   Procedure: VIDEO ASSISTED THORACOSCOPY (VATS) W/TALC PLEURADESIS;  Surgeon: Grace Isaac, MD;  Location: [place];  Service: Thoracic;  Laterality: Left;  [name] VIDEO BRONCHOSCOPY N/A 12/20/2016   Procedure: VIDEO BRONCHOSCOPY;  Surgeon: Grace Isaac, MD;  Location: [facility] OR;  Service: Thoracic;  Laterality: N/A;    . Social History  Substance Use Topics  . Smoking status: Former Smoker    Packs/day: 2.00    Years: 30.00    Types: Cigars  . Smokeless tobacco: Never Used     Comment: Quit ~2017  . Alcohol use No     Comment: Former use    ALLERGIES:  is allergic to tuberculin purified protein derivative and sulfa antibiotics.  MEDICATIONS:  Current Outpatient Prescriptions  Medication Sig Dispense Refill  . aspirin EC 81 MG tablet Take 162 mg by mouth daily.    [name] HYDROcodone-acetaminophen (NORCO/VICODIN) 5-325 MG tablet Take 1-2 tablets by mouth every 4 (four) hours as needed for severe pain. 30 tablet 0  . ibuprofen (ADVIL,MOTRIN) 800 MG tablet Take 1 tablet (800 mg total) by mouth every 8 (eight) hours as needed. 21 tablet 0  . dexamethasone (DECADRON) 4 MG tablet Take 1 tab two  times a day the day before Alimta chemo. Take 2 tabs two times a day starting the day after chemo for 3 days. 30 tablet 1  . dronabinol (MARINOL) 5 MG capsule Take 1 capsule (5 mg total) by mouth 2 (two) times daily before a meal. 60 capsule 0  . folic acid (FOLVITE) 1 MG tablet Take 1 tablet (1 mg total) by mouth daily. Start 5-7 days before Alimta chemotherapy. Continue until 21 days after Alimta completed. 100 tablet 3  . ondansetron (ZOFRAN) 8 MG tablet Take 1 tablet (8 mg total) by mouth 2 (two) times daily as needed for refractory nausea / vomiting. Start on day 3 after chemo. 30 tablet 1  . oxyCODONE (OXY IR/ROXICODONE) 5 MG immediate release tablet Take 1-2 tablets (5-10 mg total) by mouth every 4 (four) hours as needed for severe pain. 60 tablet 0  . oxyCODONE (OXYCONTIN) 10 mg 12 hr tablet Take 1 tablet (10 mg total) by mouth every 12 (twelve) hours. 60 tablet 0  . polyethylene glycol (MIRALAX) packet Take 17 g by mouth daily. 30 each 1  . prochlorperazine (COMPAZINE) 10 MG tablet Take 1 tablet (10 mg total) by mouth every 6 (six) hours as  needed (Nausea or vomiting). 30 tablet 1  . senna-docusate (SENNA S) 8.6-50 MG tablet Take 2 tablets by mouth 2 (two) times daily. 60 tablet 2   No current facility-administered medications for this visit.     PHYSICAL EXAMINATION: ECOG PERFORMANCE STATUS: 2 - Symptomatic, <50% confined to bed  . Vitals:   01/09/17 1339  BP: 127/70  Pulse: (!) 102  Resp: 16  Temp: 97.6 F (36.4 C)    Filed Weights   01/09/17 1339  Weight: 142 lb 9.6 oz (64.7 kg)   .Body mass index is 21.06 kg/m.  GENERAL:alert, in no acute distress and comfortable SKIN: no acute rashes, no significant lesions EYES: conjunctiva are pink and non-injected, sclera anicteric OROPHARYNX: MMM, no exudates, no oropharyngeal erythema or ulceration NECK: supple, no JVD LYMPH:  no palpable lymphadenopathy in the cervical, axillary or inguinal regions LUNGS:Decreased at entry  left lung base  HEART: regular rate & rhythm ABDOMEN:  normoactive bowel sounds , non tender, not distended. Extremity: no pedal edema PSYCH: alert & oriented x 3 with fluent speech NEURO: no focal motor/sensory deficits  LABORATORY DATA:   I have reviewed the data as listed  . CBC Latest Ref Rng & Units 01/12/2017 12/26/2016 12/23/2016  WBC 4.0 - 10.3 10e3/uL 5.5 6.9 7.0  Hemoglobin 13.0 - 17.1 g/dL 9.7(L) 9.6(L) 10.4(L)  Hematocrit 38.4 - 49.9 % 30.3(L) 29.0(L) 31.8(L)  Platelets 140 - 400 10e3/uL 290 312 318    . CMP Latest Ref Rng & Units 01/12/2017 12/25/2016 12/23/2016  Glucose 70 - 140 mg/dl 81 116(H) 91  BUN 7.0 - 26.0 mg/dL 12.2 13 10   Creatinine 0.7 - 1.3 mg/dL 0.8 0.78 0.70  Sodium 136 - 145 mEq/L 135(L) 133(L) 134(L)  Potassium 3.5 - 5.1 mEq/L 4.3 4.3 4.3  Chloride 101 - 111 mmol/L - 97(L) 98(L)  CO2 22 - 29 mEq/L 25 26 27   Calcium 8.4 - 10.4 mg/dL 10.2 9.8 9.7  Total Protein 6.4 - 8.3 g/dL 8.6(H) - -  Total Bilirubin 0.20 - 1.20 mg/dL 0.52 - -  Alkaline Phos 40 - 150 U/L 77 - -  AST 5 - 34 U/L 13 - -  ALT 0 - 55 U/L 8 - -         RADIOGRAPHIC STUDIES: I have personally reviewed the radiological images as listed and agreed with the findings in the report. Dg Chest 2 View  Result Date: 01/04/2017 CLINICAL DATA:  History of left-sided VATS EXAM: CHEST  2 VIEW COMPARISON:  12/25/2016 FINDINGS: Left-sided pleural effusion again noted with left base collapse/ consolidation. The pleural air-fluid level seen on the left previously have resolved in the interval. Left pleural drain remains in place. Probable loculated pleural fluid in the left apex. Right lung remains clear. Heart size is stable. The visualized bony structures of the thorax are intact. IMPRESSION: 1. The scattered air-fluid level seen in the left pleural space previously of resolved in the interval. 2. Persistent moderate left pleural effusion with left base collapse/consolidation. Probable loculated fluid  towards the left apex. 3. Left pleural drain remains in place. Electronically Signed   By: Misty Stanley M.D.   On: 01/04/2017 12:08   Dg Chest 2 View  Result Date: 12/25/2016 CLINICAL DATA:  Postop check. 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 The largest and most anterior fluid level is now more fluid and gas, but overall disease and opacification is similar  to prior. Left-sided pleural thickening correlating with thick enhancing pleura on previous CT. Left pleural drain is in similar position to prior. Left subclavian line with tip near the SVC origin. Clear right chest. Stable heart size. 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 No significant change in left chest opacification. Electronically Signed   By: Monte Fantasia M.D.   On: 12/25/2016 14:10   Dg Chest 2 View  Result Date: 12/24/2016 CLINICAL DATA:  Two days post talc pleurodesis and PleurX catheter placement. EXAM: CHEST  2 VIEW COMPARISON:  One-view chest x-ray 12/23/2016. FINDINGS: The heart is mildly enlarged.  A left subclavian line is in place. Upright imaging demonstrates a fluid level compatible with a large residual left-sided hydropneumothorax. Left lower lobe airspace disease remains. IMPRESSION: 1. Persistent large left hydropneumothorax with catheter placed. 2. Left lower lobe airspace disease. Electronically Signed   By: San Morelle M.D.   On: 12/24/2016 09:15   Dg Chest Port 1 View  Result Date: 12/23/2016 CLINICAL DATA:  Left VATS with talc pleurodesis. EXAM: PORTABLE CHEST 1 VIEW COMPARISON:  12/22/2016 and prior exams FINDINGS: A left subclavian central venous catheter with tip overlying the proximal SVC and left thoracostomy tube again noted. Postsurgical changes and atelectasis within the left mid and lower lung again noted. There is no evidence of pneumothorax. Left pleural  thickening/fluid and superior left paramediastinal density are unchanged. IMPRESSION: Unchanged appearance of the chest as described above. No evidence of pneumothorax. Electronically Signed   By: Margarette Canada M.D.   On: 12/23/2016 09:22    ASSESSMENT & PLAN:   66 year old male with  #1 Newly diagnosed Metastatic lung adenocarcinoma (with M1a disease - pleural nodules and malignant pleural effusion) PDL1 0% #2 Recurrent left-sided malignant pleural effusion -now s/p pleurex catheter placement and drainage. Pleurx catheter removed on outpatient follow-up with cardiothoracic surgery . No overt evidence of metastatic disease outside of the chest cavity or in the brain. Pathology showed poorly differentiated adenocarcinoma. Plan -Messaged by RN to follow-up on Foundation one results to determine if he has any targetable mutations -not a candidate for 1st line Immunotherapy -discussed goals of care and treatment options BSC vs palliative systemic therapies -he chooses to pursue palliative ctx with platinum doublet with Alimta with G-CSF support. -Chemotherapy counseling  -Orders placed. -Lung cancer navigator RN involved to help with Medication. Social worker referral for advanced Directives living will and health care proxy paperwork and other questions that the patient has.  #2 ex-smoker with more than 100-pack-year history of smoking  -he has quit smoking about a year ago  #3 exalcohol abuse and remote IVDU- patient notes no recent IV drug use and has been sober from alcohol use from a year.  #4 hepatitis C has finished treatment with harvonirecently  #5 moderate protein calorie malnutrition and anorexia due to malignancy and recent treatment for hepatitis C. -Dietitian referral given -Started on Marinol 5 mg by mouth twice a day  #6 cancer related pain -Started on OxyContin 10 mg by mouth twice a day -Oxycodone every 4 hours as needed for breakthrough pain -Senna S and  MiraLAX for bowel prophylaxis.   #7 symptomatic anemia -Will transfuse 1 unit of PRBC.  Needs to establish PCP.   PRBC transfusion x 1 unit this week (labs prior to this) B12 shot when he comes for PRBC transfusion. Dietician referral Social worker referral Chemotherapy -Carboplatin/Alimta Chemo-counselling prior to chemotherapy RTC with Dr Irene Limbo in 7-10 days post C1 chemotherapy for  toxicity check  I spent 40 minutes counseling the patient face to face. The total time spent in the appointment was 50 minutes and more than 50% was on counseling and direct patient cares.    Sullivan Lone MD [place] AAHIVMS [facility] Hematology/Oncology Physician [facility]  (Office):       [phone] (Work cell):  [phone] (Fax):           [phone]

## 2017-01-23 ENCOUNTER — Other Ambulatory Visit: Payer: Self-pay | Admitting: *Deleted

## 2017-01-23 MED ORDER — OXYCODONE HCL 5 MG PO TABS: 5.0000 mg | ORAL_TABLET | ORAL | 0 refills | Status: DC | PRN

## 2017-01-24 ENCOUNTER — Encounter (HOSPITAL_COMMUNITY): Payer: Self-pay

## 2017-01-24 ENCOUNTER — Other Ambulatory Visit: Payer: Self-pay | Admitting: *Deleted

## 2017-01-24 DIAGNOSIS — C3492 Malignant neoplasm of unspecified part of left bronchus or lung: Secondary | ICD-10-CM

## 2017-01-25 ENCOUNTER — Encounter: Payer: Self-pay | Admitting: Cardiothoracic Surgery

## 2017-01-25 ENCOUNTER — Other Ambulatory Visit (HOSPITAL_BASED_OUTPATIENT_CLINIC_OR_DEPARTMENT_OTHER): Payer: Medicare Other

## 2017-01-25 ENCOUNTER — Ambulatory Visit (HOSPITAL_COMMUNITY)
Admission: RE | Admit: 2017-01-25 | Discharge: 2017-01-25 | Disposition: A | Discharge status: Home / Self Care | Payer: Medicare Other | Source: Ambulatory Visit | Attending: Hematology | Admitting: Hematology

## 2017-01-25 ENCOUNTER — Encounter: Payer: Self-pay | Admitting: Hematology

## 2017-01-25 ENCOUNTER — Ambulatory Visit (INDEPENDENT_AMBULATORY_CARE_PROVIDER_SITE_OTHER): Payer: Self-pay | Admitting: Cardiothoracic Surgery

## 2017-01-25 ENCOUNTER — Telehealth: Payer: Self-pay | Admitting: Hematology

## 2017-01-25 ENCOUNTER — Ambulatory Visit (HOSPITAL_BASED_OUTPATIENT_CLINIC_OR_DEPARTMENT_OTHER): Payer: Medicare Other | Admitting: Hematology

## 2017-01-25 VITALS — BP 127/80 | HR 101 | Temp 98.4°F | Resp 20

## 2017-01-25 VITALS — BP 118/69 | HR 96 | Resp 20 | Ht 69.0 in | Wt 138.0 lb

## 2017-01-25 DIAGNOSIS — Z09 Encounter for follow-up examination after completed treatment for conditions other than malignant neoplasm: Secondary | ICD-10-CM

## 2017-01-25 DIAGNOSIS — C3482 Malignant neoplasm of overlapping sites of left bronchus and lung: Secondary | ICD-10-CM

## 2017-01-25 DIAGNOSIS — C384 Malignant neoplasm of pleura: Secondary | ICD-10-CM | POA: Diagnosis present

## 2017-01-25 DIAGNOSIS — D649 Anemia, unspecified: Secondary | ICD-10-CM | POA: Diagnosis not present

## 2017-01-25 DIAGNOSIS — J910 Malignant pleural effusion: Secondary | ICD-10-CM

## 2017-01-25 DIAGNOSIS — E440 Moderate protein-calorie malnutrition: Secondary | ICD-10-CM

## 2017-01-25 DIAGNOSIS — C3492 Malignant neoplasm of unspecified part of left bronchus or lung: Secondary | ICD-10-CM | POA: Insufficient documentation

## 2017-01-25 DIAGNOSIS — G893 Neoplasm related pain (acute) (chronic): Secondary | ICD-10-CM | POA: Diagnosis not present

## 2017-01-25 DIAGNOSIS — J90 Pleural effusion, not elsewhere classified: Secondary | ICD-10-CM

## 2017-01-25 DIAGNOSIS — D696 Thrombocytopenia, unspecified: Secondary | ICD-10-CM | POA: Diagnosis not present

## 2017-01-25 LAB — CBC WITH DIFFERENTIAL/PLATELET
BASO%: 0.1 % (ref 0.0–2.0)
Basophils Absolute: 0 10*3/uL (ref 0.0–0.1)
EOS ABS: 0 10*3/uL (ref 0.0–0.5)
EOS%: 0.3 % (ref 0.0–7.0)
HCT: 31.9 % — ABNORMAL LOW (ref 38.4–49.9)
HGB: 10.2 g/dL — ABNORMAL LOW (ref 13.0–17.1)
LYMPH%: 10.7 % — AB (ref 14.0–49.0)
MCH: 30.2 pg (ref 27.2–33.4)
MCHC: 32 g/dL (ref 32.0–36.0)
MCV: 94.4 fL (ref 79.3–98.0)
MONO#: 1 10*3/uL — AB (ref 0.1–0.9)
MONO%: 14.1 % — AB (ref 0.0–14.0)
NEUT#: 5.3 10*3/uL (ref 1.5–6.5)
NEUT%: 74.8 % (ref 39.0–75.0)
PLATELETS: 81 10*3/uL — AB (ref 140–400)
RBC: 3.38 10*6/uL — AB (ref 4.20–5.82)
RDW: 15.9 % — ABNORMAL HIGH (ref 11.0–14.6)
WBC: 7 10*3/uL (ref 4.0–10.3)
lymph#: 0.8 10*3/uL — ABNORMAL LOW (ref 0.9–3.3)

## 2017-01-25 LAB — COMPREHENSIVE METABOLIC PANEL
ALT: 10 U/L (ref 0–55)
ANION GAP: 9 meq/L (ref 3–11)
AST: 22 U/L (ref 5–34)
Albumin: 2.9 g/dL — ABNORMAL LOW (ref 3.5–5.0)
Alkaline Phosphatase: 106 U/L (ref 40–150)
BUN: 14.4 mg/dL (ref 7.0–26.0)
CHLORIDE: 100 meq/L (ref 98–109)
CO2: 23 meq/L (ref 22–29)
Calcium: 9.9 mg/dL (ref 8.4–10.4)
Creatinine: 0.9 mg/dL (ref 0.7–1.3)
Glucose: 104 mg/dl (ref 70–140)
POTASSIUM: 4.2 meq/L (ref 3.5–5.1)
SODIUM: 132 meq/L — AB (ref 136–145)
Total Bilirubin: 0.6 mg/dL (ref 0.20–1.20)
Total Protein: 8.9 g/dL — ABNORMAL HIGH (ref 6.4–8.3)

## 2017-01-25 MED ORDER — DEXAMETHASONE 4 MG PO TABS: 4.0000 mg | ORAL_TABLET | Freq: Two times a day (BID) | ORAL | 0 refills | Status: AC

## 2017-01-25 MED ORDER — MORPHINE SULFATE ER 30 MG PO TBCR: 30.0000 mg | EXTENDED_RELEASE_TABLET | Freq: Two times a day (BID) | ORAL | 0 refills | Status: DC

## 2017-01-25 NOTE — Progress Notes (Signed)
[address]       [place] [address]             [phone]      Lanell V Bolz Cheney Medical Record #[id number] Date of Birth: [birth year]  Referring: Ophelia Shoulder, MD Primary Care: No PCP Per Patient  Chief Complaint:   POST OP FOLLOW UP 12/20/2016  OPERATIVE REPORT  PREOPERATIVE DIAGNOSIS:  Large left pleural effusion. 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 PROCEDURES PERFORMED:  Bronchoscopy, left video-assisted thoracoscopy, pleural biopsy, drainage of effusion, sterile talc pleurodesis, and placement of PleurX catheter. SURGEON:  Lanelle Bal, MD  History of Present Illness:     Patient returns today after recent diagnosis of lung cancer with a large left pleural effusion, patient had undergone bronchoscopy left video-assisted thoracoscopy with pleural biopsy and drainage of the effusion. A talc pleurodesis was performed. Pleurx catheter was placed with drainage. The patient was discharged home on daily drainage with very little drainage coming out, this was converted to every day drainage 3 with just scant drainage.   Since last seen the patient has been seen by oncology and started on every three-week chemotherapy regiment he appears to be tolerating this without significant shortness of breath.   Past Medical History:  Diagnosis Date  . G6PD deficiency ([place])   . H/O orthostatic hypotension   . Pneumonia   . RBBB (right bundle branch block)   . Seizures ([place])      History  Smoking Status  . Former Smoker  . Packs/day: 2.00  . Years: 30.00  . Types: Cigars  Smokeless Tobacco  . Never Used    Comment: Quit ~2017    History  Alcohol Use No    Comment: Former use     Allergies  Allergen Reactions  . Tuberculin Purified Protein Derivative Swelling    ABSCESS  . Sulfa Antibiotics     Current Outpatient Prescriptions  Medication Sig Dispense Refill  . aspirin EC 81  MG tablet Take 162 mg by mouth daily.    [name] dexamethasone (DECADRON) 4 MG tablet Take 1 tab two times a day the day before Alimta chemo. Take 2 tabs two times a day starting the day after chemo for 3 days. 30 tablet 1  . dexamethasone (DECADRON) 4 MG tablet Take 1 tablet (4 mg total) by mouth 2 (two) times daily. 10 tablet 0  . dronabinol (MARINOL) 5 MG capsule Take 1 capsule (5 mg total) by mouth 2 (two) times daily before a meal. 60 capsule 0  . folic acid (FOLVITE) 1 MG tablet Take 1 tablet (1 mg total) by mouth daily. Start 5-7 days before Alimta chemotherapy. Continue until 21 days after Alimta completed. 100 tablet 3  . morphine (MS CONTIN) 30 MG 12 hr tablet Take 1 tablet (30 mg total) by mouth every 12 (twelve) hours. 60 tablet 0  . ondansetron (ZOFRAN) 8 MG tablet Take 1 tablet (8 mg total) by mouth 2 (two) times daily as needed for refractory nausea / vomiting. Start on day 3 after chemo. 30 tablet 1  . oxyCODONE (OXY IR/ROXICODONE) 5 MG immediate release tablet Take 1-2 tablets (5-10 mg total) by mouth every 4 (four) hours as needed for severe pain. 60 tablet 0  . polyethylene glycol (MIRALAX) packet Take 17 g by mouth daily. 30 each 1  . prochlorperazine (COMPAZINE) 10 MG tablet Take 1 tablet (10 mg total) by mouth every 6 (six)  hours as needed (Nausea or vomiting). 30 tablet 1  . senna-docusate (SENNA S) 8.6-50 MG tablet Take 2 tablets by mouth 2 (two) times daily. 60 tablet 2   No current facility-administered medications for this visit.        Physical Exam: BP 118/69   Pulse 96   Resp 20   Ht '5\' 9"'$  (1.753 m)   Wt 138 lb (62.6 kg)   SpO2 93% Comment: RA  BMI 20.38 kg/m   General appearance: alert, cooperative and appears stated age Neurologic: intact Heart: regular rate and rhythm, S1, S2 normal, no murmur, click, rub or gallop Lungs: diminished breath sounds LLL Abdomen: soft, non-tender; bowel sounds normal; no masses,  no organomegaly Extremities: extremities normal,  atraumatic, no cyanosis or edema and Homans sign is negative, no sign of DVT Wound:  Patient's incisions are well-healed     Diagnostic Studies & Laboratory data:     Recent Radiology Findings:   Dg Chest 2 View  Result Date: 01/25/2017 CLINICAL DATA:  66 year old male status post left side VATS and talc pleurodesis in February for malignant left pleural effusion (poorly differentiated probable adenocarcinoma). Subsequent encounter. EXAM: CHEST  2 VIEW COMPARISON:  01/04/2017 and earlier. FINDINGS: Left lower pleural drain no longer present. Stable appearance of moderate opacification throughout the mid and lower left lung since 01/04/2017, including moderate volume residual left pleural effusion. Streaky perihilar opacity persists including into the left lung apex where pleural thickening and medial apical pleural based mass were noted on prior CT. Visible mediastinal contours are stable. The right lung appears stable and negative. Visualized tracheal air column is within normal limits. Negative visible bowel gas pattern. No acute osseous abnormality identified. IMPRESSION: Stable radiographic appearance of the chest since 01/04/2017. Residual left pleural pleural effusion and pleural based mass. No newcardiopulmonary abnormality. Electronically Signed   By: Genevie Ann M.D.   On: 01/25/2017 10:36      Recent Lab Findings: Lab Results  Component Value Date   WBC 7.0 01/25/2017   HGB 10.2 (L) 01/25/2017   HCT 31.9 (L) 01/25/2017   PLT 81 (L) 01/25/2017   GLUCOSE 104 01/25/2017   CHOL  02/23/2011    130        ATP III CLASSIFICATION:  <200     mg/dL   Desirable  200-239  mg/dL   Borderline High  >=240    mg/dL   High          TRIG 123 02/23/2011   HDL 26 (L) 02/23/2011   LDLCALC  02/23/2011    79        Total Cholesterol/HDL:CHD Risk Coronary Heart Disease Risk Table                     Men   Women  1/2 Average Risk   3.4   3.3  Average Risk       5.0   4.4  2 X Average Risk   9.6    7.1  3 X Average Risk  23.4   11.0        Use the calculated Patient Ratio above and the CHD Risk Table to determine the patient's CHD Risk.        ATP III CLASSIFICATION (LDL):  <100     mg/dL   Optimal  100-129  mg/dL   Near or Above                    Optimal  130-159  mg/dL   Borderline  160-189  mg/dL   High  >190     mg/dL   Very High   ALT 10 01/25/2017   AST 22 01/25/2017   NA 132 (L) 01/25/2017   K 4.2 01/25/2017   CL 97 (L) 12/25/2016   CREATININE 0.9 01/25/2017   BUN 14.4 01/25/2017   CO2 23 01/25/2017   INR 1.16 12/19/2016   HGBA1C 4.9 12/22/2016      Assessment / Plan:      Patient with stage IV lung cancer with pleural involvement, status post talc pleurodesis on the left, Pleurx was 3 weeks ago Some residual pleural based mass on chest x-ray. Symptomatically the patient is stable tolerating chemotherapy. Plan to see back in 3 months with a follow-up chest x-ray     Grace Isaac MD      [address] [place] [address] Office [phone]   Beeper [phone]  01/25/2017 12:03 PM

## 2017-01-25 NOTE — Patient Instructions (Signed)
Thank you for choosing [facility] to provide your oncology and hematology care.  To afford each patient quality time with our providers, please arrive 30 minutes before your scheduled appointment time.  If you arrive late for your appointment, you may be asked to reschedule.  We strive to give you quality time with our providers, and arriving late affects you and other patients whose appointments are after yours.  If you are a no show for multiple scheduled visits, you may be dismissed from the clinic at the providers discretion.   Again, thank you for choosing [facility], our hope is that these requests will decrease the amount of time that you wait before being seen by our physicians.  ______________________________________________________________________ Should you have questions after your visit to the [facility], please contact our office at [phone] between the hours of 8:30 and 4:30 p.m.    Voicemails left after 4:30p.m will not be returned until the following business day.   For prescription refill requests, please have your pharmacy contact us directly.  Please also try to allow 48 hours for prescription requests.   Please contact the scheduling department for questions regarding scheduling.  For scheduling of procedures such as PET scans, CT scans, MRI, Ultrasound, etc please contact central scheduling at [phone].   Resources For Cancer Patients and Caregivers:  American Cancer Society:  800-227-2345  Can help patients locate various types of support and financial assistance Cancer Care: 1-800-813-HOPE (4673) Provides financial assistance, online support groups, medication/co-pay assistance.   [place] DSS:  336-641-3447 Where to apply for food stamps, Medicaid, and utility assistance Medicare Rights Center: 800-333-4114 Helps people with Medicare understand their rights and benefits, navigate the Medicare system, and secure the  quality healthcare they deserve SCAT: 336-333-6589 [employer]'s shared-ride transportation service for eligible riders who have a disability that prevents them from riding the fixed route bus.   For additional information on assistance programs please contact our social worker:   Grier Hock/Abigail Elmore:  [phone] 

## 2017-01-25 NOTE — Telephone Encounter (Signed)
Appointments scheduled per 3.29.18 LOS. Patient given AVS report and calendars with future scheduled appointments. °

## 2017-02-01 NOTE — Progress Notes (Signed)
[name]  HEMATOLOGY ONCOLOGY PROGRESS NOTE  Date of service: .01/25/2017  Patient Care Team: No Pcp Per Patient as PCP - General (General Practice)  CC: f/u for metastatic Non small cell lung cancer  Diagnosis: Newly diagnosed Metastatic lung adenocarcinoma (with M1a disease - pleural nodules and malignant pleural effusion)  Current Treatment: Carboplatin + Alimta  HPI  Joshua White is a 66 y.o. male who has been referred to [place] by Dr Aldine Contes, MD for evaluation and management of likely metastatic lung cancer.  Patient has a history of seizures, G6PD deficiency, right bundle branch block , heavy smoking history and hepatitis C and is just about done with his 12 week treatment with Harvoni for his hepatitis C. Patient notes that he has had progressive fatigue and anorexia for the last 2-3 months. He initially assigned his symptoms to his treatment for hepatitis C with Harvoni but was concerned when the symptoms persisted. He notes he has lost about 15-20 pounds over the last couple of months. She presented to the emergency room about a week ago with significant shortness of breath for 2-3 weeks  and was noted to have a large pleural effusion and had a therapeutic thoracentesis with removal of 1 L fluid which was sent for analysis. It was noted to be exudative with cytology concerning for atypical cells which are likely malignant.  Patient presented to his primary care physician in follow-up and was noted to have significant shortness of breath again and recurrent large left-sided pleural effusion for which she was admitted. He had a CT of the chest which showed an extremely large left-sided pleural effusion occupying majority of the left hemithorax with dense consolidation of most of the left lung. Diffuse infiltrating mass along the left side of the mediastinum measuring 3.6 cm and extending into much of the pleura and left hemithorax . He was also noted to have mediastinal lymphadenopathy  and a 2.1 cm nodular opacity in the right lung . Concern for possible metastases in the epicardial fat pad . Also had mildly prominent retroperitoneal lymph nodes measuring about 8 mm which were nonspecific . CT abdomen showed no other obvious metastatic disease.  Patient had an MRI of the brain which shows no evidence of metastatic disease. 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  Patient has had a repeat therapeutic thoracentesis today cytology from this is currently pending. Cardiothoracic surgery was consulted for his recurrent large pleural effusion and there are plans for a VATS procedure with pleural biopsy and possible placement of a Pleurx catheter on 12/20/2016.  Patient notes his breathing is somewhat better after the thoracentesis. Has some left sided chest pain.  He notes that he smoked about 1-2 packs a day since age 57 and quit about a year ago. No headaches no focal neurological deficits. No abdominal pain or distention. Weight loss of about 20 pounds over the last couple months.  Pathology results showed poorly differentiated lung adenocarcinoma.  PDL1 testing 0% Other [facility] One results pending  INTERVAL HISTORY:  Patient is here for toxicity check after his 1st cycle of chemotherapy. Notes grade 1 fatigue. No mucositis. No other acute new focal toxicities. He was unable to get his Oxycontin since his insurance would not pay for it --switch to MS contin. Has been having left chest pain, Has appointment with Dr Servando Snare at noon today for conitnued mx of his left sided pleural effusion. No fevers/chills. Eating better and feels the Marinol is  been helpful. No other acute new concerns.  REVIEW OF SYSTEMS:    10 Point review of systems of done and is negative except as noted above.  . Past Medical History:  Diagnosis Date  . G6PD deficiency ([place])   . H/O orthostatic hypotension   . Pneumonia   . RBBB (right bundle  branch block)   . Seizures ([place])     . Past Surgical History:  Procedure Laterality Date  . APPENDECTOMY    . CHEST TUBE INSERTION Left 12/20/2016   Procedure: INSERTION PLEURAL DRAINAGE CATHETER;  Surgeon: Grace Isaac, MD;  Location: [facility];  Service: Thoracic;  Laterality: Left;  . PLEURAL BIOPSY Left 12/20/2016   Procedure: PLEURAL BIOPSY;  Surgeon: Grace Isaac, MD;  Location: [facility];  Service: Thoracic;  Laterality: Left;  . US ECHOCARDIOGRAPHY  05/12/2008   EF 55-60%  . VIDEO ASSISTED THORACOSCOPY (VATS) W/TALC PLEUADESIS Left 12/20/2016   Procedure: VIDEO ASSISTED THORACOSCOPY (VATS) W/TALC PLEURADESIS;  Surgeon: Grace Isaac, MD;  Location: [facility];  Service: Thoracic;  Laterality: Left;  [name] VIDEO BRONCHOSCOPY N/A 12/20/2016   Procedure: VIDEO BRONCHOSCOPY;  Surgeon: Grace Isaac, MD;  Location: [facility] OR;  Service: Thoracic;  Laterality: N/A;    . Social History  Substance Use Topics  . Smoking status: Former Smoker    Packs/day: 2.00    Years: 30.00    Types: Cigars  . Smokeless tobacco: Never Used     Comment: Quit ~2017  . Alcohol use No     Comment: Former use    ALLERGIES:  is allergic to tuberculin purified protein derivative and sulfa antibiotics.  MEDICATIONS:  Current Outpatient Prescriptions  Medication Sig Dispense Refill  . aspirin EC 81 MG tablet Take 162 mg by mouth daily.    [name] dexamethasone (DECADRON) 4 MG tablet Take 1 tab two times a day the day before Alimta chemo. Take 2 tabs two times a day starting the day after chemo for 3 days. 30 tablet 1  . dronabinol (MARINOL) 5 MG capsule Take 1 capsule (5 mg total) by mouth 2 (two) times daily before a meal. 60 capsule 0  . folic acid (FOLVITE) 1 MG tablet Take 1 tablet (1 mg total) by mouth daily. Start 5-7 days before Alimta chemotherapy. Continue until 21 days after Alimta completed. 100 tablet 3  . ondansetron (ZOFRAN) 8 MG tablet Take 1 tablet (8 mg total) by mouth 2 (two) times daily as  needed for refractory nausea / vomiting. Start on day 3 after chemo. 30 tablet 1  . oxyCODONE (OXY IR/ROXICODONE) 5 MG immediate release tablet Take 1-2 tablets (5-10 mg total) by mouth every 4 (four) hours as needed for severe pain. 60 tablet 0  . polyethylene glycol (MIRALAX) packet Take 17 g by mouth daily. 30 each 1  . prochlorperazine (COMPAZINE) 10 MG tablet Take 1 tablet (10 mg total) by mouth every 6 (six) hours as needed (Nausea or vomiting). 30 tablet 1  . senna-docusate (SENNA S) 8.6-50 MG tablet Take 2 tablets by mouth 2 (two) times daily. 60 tablet 2  . dexamethasone (DECADRON) 4 MG tablet Take 1 tablet (4 mg total) by mouth 2 (two) times daily. 10 tablet 0  . morphine (MS CONTIN) 30 MG 12 hr tablet Take 1 tablet (30 mg total) by mouth every 12 (twelve) hours. 60 tablet 0   No current facility-administered medications for this visit.     PHYSICAL EXAMINATION: ECOG PERFORMANCE STATUS: 2 - Symptomatic, <50%  confined to bed  . Vitals:   01/25/17 0920  BP: 127/80  Pulse: (!) 101  Resp: 20  Temp: 98.4 F (36.9 C)    There were no vitals filed for this visit. .There is no height or weight on file to calculate BMI.  GENERAL:alert, in no acute distress and comfortable SKIN: no acute rashes, no significant lesions EYES: conjunctiva are pink and non-injected, sclera anicteric OROPHARYNX: MMM, no exudates, no oropharyngeal erythema or ulceration NECK: supple, no JVD LYMPH:  no palpable lymphadenopathy in the cervical, axillary or inguinal regions LUNGS:Decreased at entry left lung base  HEART: regular rate & rhythm ABDOMEN:  normoactive bowel sounds , non tender, not distended. Extremity: no pedal edema PSYCH: alert & oriented x 3 with fluent speech NEURO: no focal motor/sensory deficits  LABORATORY DATA:   I have reviewed the data as listed  . CBC Latest Ref Rng & Units 01/25/2017 01/12/2017 12/26/2016  WBC 4.0 - 10.3 10e3/uL 7.0 5.5 6.9  Hemoglobin 13.0 - 17.1 g/dL  10.2(L) 9.7(L) 9.6(L)  Hematocrit 38.4 - 49.9 % 31.9(L) 30.3(L) 29.0(L)  Platelets 140 - 400 10e3/uL 81(L) 290 312    . CMP Latest Ref Rng & Units 01/25/2017 01/12/2017 12/25/2016  Glucose 70 - 140 mg/dl 104 81 116(H)  BUN 7.0 - 26.0 mg/dL 14.4 12.2 13  Creatinine 0.7 - 1.3 mg/dL 0.9 0.8 0.78  Sodium 136 - 145 mEq/L 132(L) 135(L) 133(L)  Potassium 3.5 - 5.1 mEq/L 4.2 4.3 4.3  Chloride 101 - 111 mmol/L - - 97(L)  CO2 22 - 29 mEq/L _0 Calcium 8.4 - 10.4 mg/dL 9.9 10.2 9.8  Total Protein 6.4 - 8.3 g/dL 8.9(H) 8.6(H) -  Total Bilirubin 0.20 - 1.20 mg/dL 0.60 0.52 -  Alkaline Phos 40 - 150 U/L 106 77 -  AST 5 - 34 U/L 22 13 -  ALT 0 - 55 U/L 10 8 -         RADIOGRAPHIC STUDIES: I have personally reviewed the radiological images as listed and agreed with the findings in the report. Dg Chest 2 View  Result Date: 01/25/2017 CLINICAL DATA:  66 year old male status post left side VATS and talc pleurodesis in February for malignant left pleural effusion (poorly differentiated probable adenocarcinoma). Subsequent encounter. EXAM: CHEST  2 VIEW COMPARISON:  01/04/2017 and earlier. FINDINGS: Left lower pleural drain no longer present. Stable appearance of moderate opacification throughout the mid and lower left lung since 01/04/2017, including moderate volume residual left pleural effusion. Streaky perihilar opacity persists including into the left lung apex where pleural thickening and medial apical pleural based mass were noted on prior CT. Visible mediastinal contours are stable. The right lung appears stable and negative. Visualized tracheal air column is within normal limits. Negative visible bowel gas pattern. No acute osseous abnormality identified. IMPRESSION: Stable radiographic appearance of the chest since 01/04/2017. Residual left pleural pleural effusion and pleural based mass. No newcardiopulmonary abnormality. Electronically Signed   By: Genevie Ann M.D.   On: 01/25/2017 10:36   Dg  Chest 2 View  Result Date: 01/04/2017 CLINICAL DATA:  History of left-sided VATS EXAM: CHEST  2 VIEW COMPARISON:  12/25/2016 FINDINGS: Left-sided pleural effusion again noted with left base collapse/ consolidation. The pleural air-fluid level seen on the left previously have resolved in the interval. Left pleural drain remains in place. Probable loculated pleural fluid in the left apex. Right lung remains clear. Heart size is stable. The visualized bony structures of the thorax are intact.  IMPRESSION: 1. The scattered air-fluid level seen in the left pleural space previously of resolved in the interval. 2. Persistent moderate left pleural effusion with left base collapse/consolidation. Probable loculated fluid towards the left apex. 3. Left pleural drain remains in place. Electronically Signed   By: Misty Stanley M.D.   On: 01/04/2017 12:08    ASSESSMENT & PLAN:   66 year old male with  #1 Newly diagnosed Metastatic lung adenocarcinoma (with M1a disease - pleural nodules and malignant pleural effusion) PDL1 0% #2 Recurrent left-sided malignant pleural effusion -now s/p pleurex catheter placement and drainage. Pleurx catheter removed on outpatient follow-up with cardiothoracic surgery . No overt evidence of metastatic disease outside of the chest cavity or in the brain. Pathology showed poorly differentiated adenocarcinoma. Plan -no prohibitive toxicity from 1st cycle of carboplatin/Alimta -CXR done today shows stable pleural effusion --following with Dr Servando Snare for mx of pleural effusion. -pathology was called to follow-up on Foundation one results to determine if he has any targetable mutations Social worker referral for advanced Directives living will and health care proxy paperwork and other questions that the patient has.  #2 ex-smoker with more than 100-pack-year history of smoking  -he has quit smoking about a year ago  #3 exalcohol abuse and remote IVDU- patient notes no recent IV  drug use and has been sober from alcohol use from a year.  #4 hepatitis C has finished treatment with harvonirecently  #5 moderate protein calorie malnutrition and anorexia due to malignancy and recent treatment for hepatitis C. -Dietitian referral given -continue on Marinol 5 mg by mouth twice a day-- patient notes some improvement in his appetite.  #6 cancer related pain -Oxycontin switched to MS contin 61m po q12h due to insurance coverage issues. -Oxycodone every 4 hours as needed for breakthrough pain -Senna S and MiraLAX for bowel prophylaxis.   #7 symptomatic anemia- stable  #8 Thrombocytopenia - 81k likely due to ctx (has h/ Hep C- recently treated) -monitor  Needs to establish PCP.  CXR today RTC with Dr KIrene Limboon 02/06/2017 with labs    I spent 20 minutes counseling the patient face to face. The total time spent in the appointment was 30 minutes and more than 50% was on counseling and direct patient cares.    GSullivan LoneMD MWest Falls ChurchAAHIVMS SLake Health Beachwood Medical CenterCRiverside General HospitalHematology/Oncology Physician [facility] (Office):       [phone](Work cell):  [phone](Fax):           [phone]

## 2017-02-05 ENCOUNTER — Ambulatory Visit: Payer: Medicare Other

## 2017-02-06 ENCOUNTER — Other Ambulatory Visit (HOSPITAL_BASED_OUTPATIENT_CLINIC_OR_DEPARTMENT_OTHER): Payer: Medicare Other

## 2017-02-06 ENCOUNTER — Telehealth: Payer: Self-pay | Admitting: Hematology

## 2017-02-06 ENCOUNTER — Encounter: Payer: Self-pay | Admitting: Hematology

## 2017-02-06 ENCOUNTER — Ambulatory Visit (HOSPITAL_BASED_OUTPATIENT_CLINIC_OR_DEPARTMENT_OTHER): Payer: Medicare Other

## 2017-02-06 ENCOUNTER — Ambulatory Visit: Payer: Medicaid Other

## 2017-02-06 ENCOUNTER — Ambulatory Visit (HOSPITAL_BASED_OUTPATIENT_CLINIC_OR_DEPARTMENT_OTHER): Payer: Medicare Other | Admitting: Hematology

## 2017-02-06 VITALS — BP 118/63 | HR 83 | Temp 97.9°F | Resp 17 | Ht 69.0 in | Wt 139.0 lb

## 2017-02-06 DIAGNOSIS — J910 Malignant pleural effusion: Secondary | ICD-10-CM

## 2017-02-06 DIAGNOSIS — Z5189 Encounter for other specified aftercare: Secondary | ICD-10-CM

## 2017-02-06 DIAGNOSIS — C3492 Malignant neoplasm of unspecified part of left bronchus or lung: Secondary | ICD-10-CM

## 2017-02-06 DIAGNOSIS — Z7189 Other specified counseling: Secondary | ICD-10-CM

## 2017-02-06 DIAGNOSIS — Z5111 Encounter for antineoplastic chemotherapy: Secondary | ICD-10-CM | POA: Diagnosis not present

## 2017-02-06 DIAGNOSIS — E440 Moderate protein-calorie malnutrition: Secondary | ICD-10-CM | POA: Diagnosis not present

## 2017-02-06 DIAGNOSIS — C384 Malignant neoplasm of pleura: Secondary | ICD-10-CM

## 2017-02-06 DIAGNOSIS — D649 Anemia, unspecified: Secondary | ICD-10-CM

## 2017-02-06 DIAGNOSIS — G893 Neoplasm related pain (acute) (chronic): Secondary | ICD-10-CM

## 2017-02-06 DIAGNOSIS — R630 Anorexia: Secondary | ICD-10-CM

## 2017-02-06 DIAGNOSIS — C3482 Malignant neoplasm of overlapping sites of left bronchus and lung: Secondary | ICD-10-CM

## 2017-02-06 LAB — COMPREHENSIVE METABOLIC PANEL
ALT: 10 U/L (ref 0–55)
ANION GAP: 11 meq/L (ref 3–11)
AST: 15 U/L (ref 5–34)
Albumin: 3.1 g/dL — ABNORMAL LOW (ref 3.5–5.0)
Alkaline Phosphatase: 89 U/L (ref 40–150)
BUN: 20.5 mg/dL (ref 7.0–26.0)
CHLORIDE: 105 meq/L (ref 98–109)
CO2: 21 meq/L — AB (ref 22–29)
CREATININE: 0.9 mg/dL (ref 0.7–1.3)
Calcium: 10.3 mg/dL (ref 8.4–10.4)
EGFR: >90 mL/min/{1.73_m2} (ref >90)
Glucose: 97 mg/dl (ref 70–140)
Potassium: 4.6 mEq/L (ref 3.5–5.1)
Sodium: 137 mEq/L (ref 136–145)
Total Bilirubin: 0.37 mg/dL (ref 0.20–1.20)
Total Protein: 9 g/dL — ABNORMAL HIGH (ref 6.4–8.3)

## 2017-02-06 LAB — CBC & DIFF AND RETIC
BASO%: 0.1 % (ref 0.0–2.0)
Basophils Absolute: 0 10*3/uL (ref 0.0–0.1)
EOS%: 0.1 % (ref 0.0–7.0)
Eosinophils Absolute: 0 10*3/uL (ref 0.0–0.5)
HCT: 32.4 % — ABNORMAL LOW (ref 38.4–49.9)
HGB: 10.4 g/dL — ABNORMAL LOW (ref 13.0–17.1)
Immature Retic Fract: 10.9 % — ABNORMAL HIGH (ref 3.00–10.60)
LYMPH%: 9.1 % — ABNORMAL LOW (ref 14.0–49.0)
MCH: 31 pg (ref 27.2–33.4)
MCHC: 32.1 g/dL (ref 32.0–36.0)
MCV: 96.4 fL (ref 79.3–98.0)
MONO#: 1 10*3/uL — ABNORMAL HIGH (ref 0.1–0.9)
MONO%: 7.4 % (ref 0.0–14.0)
NEUT#: 11.7 10*3/uL — ABNORMAL HIGH (ref 1.5–6.5)
NEUT%: 83.3 % — ABNORMAL HIGH (ref 39.0–75.0)
Platelets: 254 10*3/uL (ref 140–400)
RBC: 3.36 10*6/uL — AB (ref 4.20–5.82)
RDW: 17.5 % — AB (ref 11.0–14.6)
RETIC %: 1.71 % (ref 0.80–1.80)
Retic Ct Abs: 57.46 10*3/uL (ref 34.80–93.90)
WBC: 14 10*3/uL — ABNORMAL HIGH (ref 4.0–10.3)
lymph#: 1.3 10*3/uL (ref 0.9–3.3)

## 2017-02-06 MED ORDER — SODIUM CHLORIDE 0.9 % IV SOLN
Freq: Once | INTRAVENOUS | Status: AC
  Administered 2017-02-06: 13:00:00 via INTRAVENOUS

## 2017-02-06 MED ORDER — OXYCODONE HCL 5 MG PO TABS: 5.0000 mg | ORAL_TABLET | ORAL | 0 refills | Status: DC | PRN

## 2017-02-06 MED ORDER — PALONOSETRON HCL INJECTION 0.25 MG/5ML
0.2500 mg | Freq: Once | INTRAVENOUS | Status: AC
  Administered 2017-02-06: 0.25 mg via INTRAVENOUS

## 2017-02-06 MED ORDER — PEGFILGRASTIM 6 MG/0.6ML ~~LOC~~ PSKT
6.0000 mg | PREFILLED_SYRINGE | Freq: Once | SUBCUTANEOUS | Status: AC
  Administered 2017-02-06: 6 mg via SUBCUTANEOUS
  Filled 2017-02-06: qty 0.6

## 2017-02-06 MED ORDER — SODIUM CHLORIDE 0.9 % IV SOLN
453.0000 mg | Freq: Once | INTRAVENOUS | Status: AC
  Administered 2017-02-06: 450 mg via INTRAVENOUS
  Filled 2017-02-06: qty 45

## 2017-02-06 MED ORDER — DRONABINOL 5 MG PO CAPS: 5.0000 mg | ORAL_CAPSULE | Freq: Two times a day (BID) | ORAL | 0 refills | Status: DC

## 2017-02-06 MED ORDER — DEXAMETHASONE SODIUM PHOSPHATE 10 MG/ML IJ SOLN
INTRAMUSCULAR | Status: AC
  Filled 2017-02-06: qty 1

## 2017-02-06 MED ORDER — SODIUM CHLORIDE 0.9 % IV SOLN
510.0000 mg/m2 | Freq: Once | INTRAVENOUS | Status: AC
  Administered 2017-02-06: 900 mg via INTRAVENOUS
  Filled 2017-02-06: qty 16

## 2017-02-06 MED ORDER — DEXAMETHASONE SODIUM PHOSPHATE 10 MG/ML IJ SOLN
10.0000 mg | Freq: Once | INTRAMUSCULAR | Status: AC
  Administered 2017-02-06: 10 mg via INTRAVENOUS

## 2017-02-06 MED ORDER — PALONOSETRON HCL INJECTION 0.25 MG/5ML
INTRAVENOUS | Status: AC
  Filled 2017-02-06: qty 5

## 2017-02-06 MED ORDER — MORPHINE SULFATE ER 30 MG PO TBCR
30.0000 mg | EXTENDED_RELEASE_TABLET | Freq: Three times a day (TID) | ORAL | 0 refills | Status: DC
Start: 2017-02-06 — End: 2017-03-15

## 2017-02-06 NOTE — Progress Notes (Signed)
Nutrition Follow-up:  Patient seen during infusion this pm for stage IV lung cancer.  Patient is followed by Dr. Irene Limbo.  Patient reports that appetite is better, continues on marinol.  Reports typically eats bacon or sausage and eggs with coffee and juice for breakfast, for lunch eats a whopper or pizza and for dinner may have small snacks (ie hot dog, ice cream).  Has not tried any of the ensure drinks.   Reports mild nausea usually in the mornings but medication helps. Reports bowel movements are better and typically having 1 per day of normal consistency.   Medications: reviewed  Labs: reviewed  Anthropometrics:   Patient weight today is 139 pounds decreased from 142.6 pounds on 3/13 (initial assessment) but increased from 3/29 visit.   NUTRITION DIAGNOSIS: Food and nutrition related knowledge deficit improving   MALNUTRITION DIAGNOSIS: Severe malnutrition continues   INTERVENTION:   Encouraged patient to continue to consume high calorie, high protein foods. Examples provided.  Encouraged patient to consume at least 1 oral nutrition supplement drink with at least 350 calories or more per day.     MONITORING, EVALUATION, GOAL: Patient will work to increase calories and protein to minimize weight loss.   NEXT VISIT: to be scheduled with treatment as needed  Danile Trier B. Zenia Resides, [place], [place] Registered Dietitian [phone] (pager)

## 2017-02-06 NOTE — Telephone Encounter (Signed)
Appointments scheduled per 02/06/17 los. Patient was given a copy of the AVS report and appointment schedule,per 02/06/17 los. °

## 2017-02-06 NOTE — Progress Notes (Signed)
Joshua White  HEMATOLOGY ONCOLOGY PROGRESS NOTE  Date of service: .02/06/2017  Patient Care Team: No Pcp Per Patient as PCP - General (General Practice)  CC: f/u for metastatic Non small cell lung cancer  Diagnosis: Newly diagnosed Metastatic lung adenocarcinoma (with M1a disease - pleural nodules and malignant pleural effusion)  Current Treatment: Carboplatin + Alimta  HPI  Joshua White is a 66 y.o. male who has been referred to [place] by Dr Aldine Contes, MD for evaluation and management of likely metastatic lung cancer.  Patient has a history of seizures, G6PD deficiency, right bundle branch block , heavy smoking history and hepatitis C and is just about done with his 12 week treatment with Harvoni for his hepatitis C. Patient notes that he has had progressive fatigue and anorexia for the last 2-3 months. He initially assigned his symptoms to his treatment for hepatitis C with Harvoni but was concerned when the symptoms persisted. He notes he has lost about 15-20 pounds over the last couple of months. She presented to the emergency room about a week ago with significant shortness of breath for 2-3 weeks  and was noted to have a large pleural effusion and had a therapeutic thoracentesis with removal of 1 L fluid which was sent for analysis. It was noted to be exudative with cytology concerning for atypical cells which are likely malignant.  Patient presented to his primary care physician in follow-up and was noted to have significant shortness of breath again and recurrent large left-sided pleural effusion for which she was admitted. He had a CT of the chest which showed an extremely large left-sided pleural effusion occupying majority of the left hemithorax with dense consolidation of most of the left lung. Diffuse infiltrating mass along the left side of the mediastinum measuring 3.6 cm and extending into much of the pleura and left hemithorax . He was also noted to have mediastinal lymphadenopathy  and a 2.1 cm nodular opacity in the right lung . Concern for possible metastases in the epicardial fat pad . Also had mildly prominent retroperitoneal lymph nodes measuring about 8 mm which were nonspecific . CT abdomen showed no other obvious metastatic disease.  Patient had an MRI of the brain which shows no evidence of metastatic disease. 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  Patient has had a repeat therapeutic thoracentesis today cytology from this is currently pending. Cardiothoracic surgery was consulted for his recurrent large pleural effusion and there are plans for a VATS procedure with pleural biopsy and possible placement of a Pleurx catheter on 12/20/2016.  Patient notes his breathing is somewhat better after the thoracentesis. Has some left sided chest pain.  He notes that he smoked about 1-2 packs a day since age 66 and quit about a year ago. No headaches no focal neurological deficits. No abdominal pain or distention. Weight loss of about 20 pounds over the last couple months.  Pathology results showed poorly differentiated lung adenocarcinoma.  PDL1 testing 0% Other foundation One results pending  INTERVAL HISTORY:  Patient is here for follow-up prior to his second cycle of palliative carboplatin and Alimta chemotherapy . He notes that he is eating better. No nausea. No change in breathing. Notes that his left sided chest wall pain due to his lung cancer is better controlled but is still using oxycodone 4-5 times a day for breakthrough . His MS Contin dose was appropriately adjusted . He finds that his Marinol has been quite useful  in helping his food intake and that he is eating much better . His albumin level seems to have improved . No fevers no chills . No other reported new symptoms . No other acute new concerns.  REVIEW OF SYSTEMS:    10 Point review of systems of done and is negative except as noted above.  . Past  Medical History:  Diagnosis Date  . G6PD deficiency ([place])   . H/O orthostatic hypotension   . Pneumonia   . RBBB (right bundle branch block)   . Seizures ([place])     . Past Surgical History:  Procedure Laterality Date  . APPENDECTOMY    . CHEST TUBE INSERTION Left 12/20/2016   Procedure: INSERTION PLEURAL DRAINAGE CATHETER;  Surgeon: Grace Isaac, MD;  Location: [place];  Service: Thoracic;  Laterality: Left;  . PLEURAL BIOPSY Left 12/20/2016   Procedure: PLEURAL BIOPSY;  Surgeon: Grace Isaac, MD;  Location: [place];  Service: Thoracic;  Laterality: Left;  . US ECHOCARDIOGRAPHY  05/12/2008   EF 55-60%  . VIDEO ASSISTED THORACOSCOPY (VATS) W/TALC PLEUADESIS Left 12/20/2016   Procedure: VIDEO ASSISTED THORACOSCOPY (VATS) W/TALC PLEURADESIS;  Surgeon: Grace Isaac, MD;  Location: [place];  Service: Thoracic;  Laterality: Left;  Joshua White VIDEO BRONCHOSCOPY N/A 12/20/2016   Procedure: VIDEO BRONCHOSCOPY;  Surgeon: Grace Isaac, MD;  [facility];  Service: Thoracic;  Laterality: N/A;    . Social History  Substance Use Topics  . Smoking status: Former Smoker    Packs/day: 2.00    Years: 30.00    Types: Cigars  . Smokeless tobacco: Never Used     Comment: Quit ~2017  . Alcohol use No     Comment: Former use    ALLERGIES:  is allergic to tuberculin purified protein derivative and sulfa antibiotics.  MEDICATIONS:  Current Outpatient Prescriptions  Medication Sig Dispense Refill  . aspirin EC 81 MG tablet Take 162 mg by mouth daily.    Joshua White dexamethasone (DECADRON) 4 MG tablet Take 1 tab two times a day the day before Alimta chemo. Take 2 tabs two times a day starting the day after chemo for 3 days. 30 tablet 1  . dexamethasone (DECADRON) 4 MG tablet Take 1 tablet (4 mg total) by mouth 2 (two) times daily. 10 tablet 0  . dronabinol (MARINOL) 5 MG capsule Take 1 capsule (5 mg total) by mouth 2 (two) times daily before a meal. 60 capsule 0  . folic acid (FOLVITE) 1 MG tablet Take  1 tablet (1 mg total) by mouth daily. Start 5-7 days before Alimta chemotherapy. Continue until 21 days after Alimta completed. 100 tablet 3  . morphine (MS CONTIN) 30 MG 12 hr tablet Take 1 tablet (30 mg total) by mouth every 8 (eight) hours. 90 tablet 0  . ondansetron (ZOFRAN) 8 MG tablet Take 1 tablet (8 mg total) by mouth 2 (two) times daily as needed for refractory nausea / vomiting. Start on day 3 after chemo. 30 tablet 1  . oxyCODONE (OXY IR/ROXICODONE) 5 MG immediate release tablet Take 1-2 tablets (5-10 mg total) by mouth every 4 (four) hours as needed for severe pain. 60 tablet 0  . polyethylene glycol (MIRALAX) packet Take 17 g by mouth daily. 30 each 1  . prochlorperazine (COMPAZINE) 10 MG tablet Take 1 tablet (10 mg total) by mouth every 6 (six) hours as needed (Nausea or vomiting). 30 tablet 1  . senna-docusate (SENNA S) 8.6-50 MG tablet Take 2 tablets by  mouth 2 (two) times daily. 60 tablet 2   No current facility-administered medications for this visit.     PHYSICAL EXAMINATION: ECOG PERFORMANCE STATUS: 2 - Symptomatic, <50% confined to bed  . Vitals:   02/06/17 1221  BP: 118/63  Pulse: 83  Resp: 17  Temp: 97.9 F (36.6 C)    Filed Weights   02/06/17 1221  Weight: 139 lb (63 kg)   .Body mass index is 20.53 kg/m.  GENERAL:alert, in no acute distress and comfortable SKIN: no acute rashes, no significant lesions EYES: conjunctiva are pink and non-injected, sclera anicteric OROPHARYNX: MMM, no exudates, no oropharyngeal erythema or ulceration NECK: supple, no JVD LYMPH:  no palpable lymphadenopathy in the cervical, axillary or inguinal regions LUNGS:Decreased at entry left lung base  HEART: regular rate & rhythm ABDOMEN:  normoactive bowel sounds , non tender, not distended. Extremity: no pedal edema PSYCH: alert & oriented x 3 with fluent speech NEURO: no focal motor/sensory deficits  LABORATORY DATA:   I have reviewed the data as listed  . CBC Latest Ref  Rng & Units 02/06/2017 01/25/2017 01/12/2017  WBC 4.0 - 10.3 10e3/uL 14.0(H) 7.0 5.5  Hemoglobin 13.0 - 17.1 g/dL 10.4(L) 10.2(L) 9.7(L)  Hematocrit 38.4 - 49.9 % 32.4(L) 31.9(L) 30.3(L)  Platelets 140 - 400 10e3/uL 254 81(L) 290    . CMP Latest Ref Rng & Units 02/06/2017 01/25/2017 01/12/2017  Glucose 70 - 140 mg/dl 97 104 81  BUN 7.0 - 26.0 mg/dL 20.5 14.4 12.2  Creatinine 0.7 - 1.3 mg/dL 0.9 0.9 0.8  Sodium 136 - 145 mEq/L 137 132(L) 135(L)  Potassium 3.5 - 5.1 mEq/L 4.6 4.2 4.3  Chloride 101 - 111 mmol/L - - -  CO2 22 - 29 mEq/L 21(L) 23 25  Calcium 8.4 - 10.4 mg/dL 10.3 9.9 10.2  Total Protein 6.4 - 8.3 g/dL 9.0(H) 8.9(H) 8.6(H)  Total Bilirubin 0.20 - 1.20 mg/dL 0.37 0.60 0.52  Alkaline Phos 40 - 150 U/L 89 106 77  AST 5 - 34 U/L _0 ALT 0 - 55 U/L _1 RADIOGRAPHIC STUDIES: I have personally reviewed the radiological images as listed and agreed with the findings in the report. Dg Chest 2 View  Result Date: 01/25/2017 CLINICAL DATA:  66 year old male status post left side VATS and talc pleurodesis in February for malignant left pleural effusion (poorly differentiated probable adenocarcinoma). Subsequent encounter. EXAM: CHEST  2 VIEW COMPARISON:  01/04/2017 and earlier. FINDINGS: Left lower pleural drain no longer present. Stable appearance of moderate opacification throughout the mid and lower left lung since 01/04/2017, including moderate volume residual left pleural effusion. Streaky perihilar opacity persists including into the left lung apex where pleural thickening and medial apical pleural based mass were noted on prior CT. Visible mediastinal contours are stable. The right lung appears stable and negative. Visualized tracheal air column is within normal limits. Negative visible bowel gas pattern. No acute osseous abnormality identified. IMPRESSION: Stable radiographic appearance of the chest since 01/04/2017. Residual left pleural pleural effusion and  pleural based mass. No newcardiopulmonary abnormality. Electronically Signed   By: Genevie Ann M.D.   On: 01/25/2017 10:36    ASSESSMENT & PLAN:   66 year old male with  #1 Newly diagnosed Metastatic lung adenocarcinoma (with M1a disease - pleural nodules and malignant pleural effusion) PDL1 0% Negative for EGFR, ALK, Ros1 and BRAF mutations.  #2 Recurrent left-sided malignant pleural effusion -now s/p pleurex catheter placement  and drainage. Pleurx catheter removed on outpatient follow-up with cardiothoracic surgery . No overt evidence of metastatic disease outside of the chest cavity or in the brain. Pathology showed poorly differentiated adenocarcinoma. Plan -no prohibitive toxicity from 1st cycle of carboplatin/Alimta -Labs today are stable -Patient is appropriate to proceed with his second cycle of carboplatin/Alimta palliative chemotherapy with G-CSF support. -We shall plan to repeat staging CT chest abdomen pelvis after 3 cycles of treatment unless new symptoms suggest otherwise.  #2 ex-smoker with more than 100-pack-year history of smoking  -he has quit smoking about a year ago  #3 exalcohol abuse and remote IVDU- patient notes no recent IV drug use and has been sober from alcohol use from a year.  #4 hepatitis C has finished treatment with harvonirecently  #5 moderate protein calorie malnutrition and anorexia due to malignancy and recent treatment for hepatitis C. -Dietitian referral given -continue on Marinol 5 mg by mouth twice a day-- patient notes some improvement in his appetite.  #6 cancer related pain -Based on need for breakthrough medications will increase MS contin to 69m po q8h  -Oxycodone every 4 hours as needed for breakthrough pain -Senna S and MiraLAX for bowel prophylaxis.   #7 symptomatic anemia- stable  #8 Thrombocytopenia - resolved platelet counts are normal today -monitor  Needs to establish PCP.  Return to clinic with Dr. KIrene Limboin 3 weeks  with cycle 3 day 1 with labs. Continue treatment as per plan   I spent 20 minutes counseling the patient face to face. The total time spent in the appointment was 25 minutes and more than 50% was on counseling and direct patient cares.    GSullivan LoneMD MMonmouthAAHIVMS SMclaren FlintCUniversity Of Iowa Hospital & ClinicsHematology/Oncology Physician CMadison Hospital (Office):       [phone](Work cell):  [phone](Fax):           [phone]

## 2017-02-06 NOTE — Patient Instructions (Signed)
[facility] Discharge Instructions for Patients Receiving Chemotherapy  Today you received the following chemotherapy agents: Alimta and Carboplatin.  To help prevent nausea and vomiting after your treatment, we encourage you to take your nausea medication as directed.   If you develop nausea and vomiting that is not controlled by your nausea medication, call the clinic.   BELOW ARE SYMPTOMS THAT SHOULD BE REPORTED IMMEDIATELY:  *FEVER GREATER THAN 100.5 F  *CHILLS WITH OR WITHOUT FEVER  NAUSEA AND VOMITING THAT IS NOT CONTROLLED WITH YOUR NAUSEA MEDICATION  *UNUSUAL SHORTNESS OF BREATH  *UNUSUAL BRUISING OR BLEEDING  TENDERNESS IN MOUTH AND THROAT WITH OR WITHOUT PRESENCE OF ULCERS  *URINARY PROBLEMS  *BOWEL PROBLEMS  UNUSUAL RASH Items with * indicate a potential emergency and should be followed up as soon as possible.  Feel free to call the clinic you have any questions or concerns. The clinic phone number is [phone].  Please show the CHEMO ALERT CARD at check-in to the Emergency Department and triage nurse.   

## 2017-02-08 ENCOUNTER — Encounter: Payer: Self-pay | Admitting: Hematology

## 2017-02-08 NOTE — Progress Notes (Signed)
Received PA request from [facility] for Oxycodone. Called [facility] after attempting to submit PA through Cover My Meds and was unsuccessful due to one already completed. Spoke with Glenard Haring whom states PA was not needed (after spending quite some time reviewing) because it was not exceeding 180 for 30 days. Called [facility] and spoke with Alver Fisher whom then transferred me to [place] the pharmacist. Ulice Dash states if it will go through if ran as a 10 day supply per directions but may run into a problem if needed before the ten days is up. This is where the quantity exception form may be needed.

## 2017-02-19 ENCOUNTER — Other Ambulatory Visit: Payer: Self-pay | Admitting: *Deleted

## 2017-02-19 MED ORDER — OXYCODONE HCL 5 MG PO TABS: 5.0000 mg | ORAL_TABLET | ORAL | 0 refills | Status: DC | PRN

## 2017-02-20 ENCOUNTER — Telehealth: Payer: Self-pay | Admitting: *Deleted

## 2017-02-20 ENCOUNTER — Encounter: Payer: Self-pay | Admitting: Hematology

## 2017-02-20 NOTE — Progress Notes (Signed)
Received PA request from [facility] for Oxycodone. [facility] and spoke with Abby who stated she changed the day supply to 6 for 10 day supply and it went through. Patient already picked up.

## 2017-02-20 NOTE — Telephone Encounter (Signed)
Received prior authorization for oxycodone.  Placed in box for PA in HIM @ 1200 02/20/17.

## 2017-02-26 ENCOUNTER — Ambulatory Visit (HOSPITAL_BASED_OUTPATIENT_CLINIC_OR_DEPARTMENT_OTHER): Payer: Medicare Other | Admitting: Hematology

## 2017-02-26 ENCOUNTER — Other Ambulatory Visit (HOSPITAL_BASED_OUTPATIENT_CLINIC_OR_DEPARTMENT_OTHER): Payer: Medicare Other

## 2017-02-26 ENCOUNTER — Encounter: Payer: Self-pay | Admitting: Hematology

## 2017-02-26 ENCOUNTER — Ambulatory Visit (HOSPITAL_BASED_OUTPATIENT_CLINIC_OR_DEPARTMENT_OTHER): Payer: Medicare Other

## 2017-02-26 ENCOUNTER — Other Ambulatory Visit: Payer: Self-pay | Admitting: *Deleted

## 2017-02-26 VITALS — BP 130/68 | HR 90 | Temp 97.5°F | Resp 18 | Wt 141.1 lb

## 2017-02-26 DIAGNOSIS — C3482 Malignant neoplasm of overlapping sites of left bronchus and lung: Secondary | ICD-10-CM

## 2017-02-26 DIAGNOSIS — J910 Malignant pleural effusion: Secondary | ICD-10-CM

## 2017-02-26 DIAGNOSIS — C384 Malignant neoplasm of pleura: Secondary | ICD-10-CM

## 2017-02-26 DIAGNOSIS — C3492 Malignant neoplasm of unspecified part of left bronchus or lung: Secondary | ICD-10-CM

## 2017-02-26 DIAGNOSIS — G893 Neoplasm related pain (acute) (chronic): Secondary | ICD-10-CM

## 2017-02-26 DIAGNOSIS — E440 Moderate protein-calorie malnutrition: Secondary | ICD-10-CM

## 2017-02-26 DIAGNOSIS — Z5189 Encounter for other specified aftercare: Secondary | ICD-10-CM

## 2017-02-26 DIAGNOSIS — D649 Anemia, unspecified: Secondary | ICD-10-CM

## 2017-02-26 DIAGNOSIS — Z5111 Encounter for antineoplastic chemotherapy: Secondary | ICD-10-CM | POA: Diagnosis not present

## 2017-02-26 DIAGNOSIS — Z7189 Other specified counseling: Secondary | ICD-10-CM

## 2017-02-26 LAB — CBC & DIFF AND RETIC
BASO%: 0 % (ref 0.0–2.0)
BASOS ABS: 0 10*3/uL (ref 0.0–0.1)
EOS ABS: 0 10*3/uL (ref 0.0–0.5)
EOS%: 0 % (ref 0.0–7.0)
HEMATOCRIT: 34.5 % — AB (ref 38.4–49.9)
HEMOGLOBIN: 11.4 g/dL — AB (ref 13.0–17.1)
IMMATURE RETIC FRACT: 5.1 % (ref 3.00–10.60)
LYMPH%: 9.4 % — AB (ref 14.0–49.0)
MCH: 32.2 pg (ref 27.2–33.4)
MCHC: 33 g/dL (ref 32.0–36.0)
MCV: 97.5 fL (ref 79.3–98.0)
MONO#: 0.1 10*3/uL (ref 0.1–0.9)
MONO%: 2.9 % (ref 0.0–14.0)
NEUT#: 3.4 10*3/uL (ref 1.5–6.5)
NEUT%: 87.7 % — ABNORMAL HIGH (ref 39.0–75.0)
NRBC: 0 % (ref 0–0)
Platelets: 170 10*3/uL (ref 140–400)
RBC: 3.54 10*6/uL — ABNORMAL LOW (ref 4.20–5.82)
RDW: 18.9 % — AB (ref 11.0–14.6)
Retic %: 1.58 % (ref 0.80–1.80)
Retic Ct Abs: 55.93 10*3/uL (ref 34.80–93.90)
WBC: 3.9 10*3/uL — ABNORMAL LOW (ref 4.0–10.3)
lymph#: 0.4 10*3/uL — ABNORMAL LOW (ref 0.9–3.3)

## 2017-02-26 LAB — COMPREHENSIVE METABOLIC PANEL
ALBUMIN: 3.2 g/dL — AB (ref 3.5–5.0)
ALK PHOS: 105 U/L (ref 40–150)
ALT: 9 U/L (ref 0–55)
ANION GAP: 12 meq/L — AB (ref 3–11)
AST: 18 U/L (ref 5–34)
BILIRUBIN TOTAL: 0.52 mg/dL (ref 0.20–1.20)
BUN: 13.5 mg/dL (ref 7.0–26.0)
CALCIUM: 10.5 mg/dL — AB (ref 8.4–10.4)
CHLORIDE: 99 meq/L (ref 98–109)
CO2: 22 mEq/L (ref 22–29)
CREATININE: 0.9 mg/dL (ref 0.7–1.3)
EGFR: >90 mL/min/{1.73_m2} (ref >90)
Glucose: 132 mg/dl (ref 70–140)
Potassium: 4.9 mEq/L (ref 3.5–5.1)
SODIUM: 133 meq/L — AB (ref 136–145)
Total Protein: 9.4 g/dL — ABNORMAL HIGH (ref 6.4–8.3)

## 2017-02-26 MED ORDER — DEXAMETHASONE SODIUM PHOSPHATE 10 MG/ML IJ SOLN
INTRAMUSCULAR | Status: AC
  Filled 2017-02-26: qty 1

## 2017-02-26 MED ORDER — PALONOSETRON HCL INJECTION 0.25 MG/5ML
INTRAVENOUS | Status: AC
  Filled 2017-02-26: qty 5

## 2017-02-26 MED ORDER — ONDANSETRON HCL 8 MG PO TABS: 8.0000 mg | ORAL_TABLET | Freq: Two times a day (BID) | ORAL | 1 refills | Status: DC | PRN

## 2017-02-26 MED ORDER — PEMETREXED DISODIUM CHEMO INJECTION 500 MG
515.0000 mg/m2 | Freq: Once | INTRAVENOUS | Status: AC
  Administered 2017-02-26: 900 mg via INTRAVENOUS
  Filled 2017-02-26: qty 20

## 2017-02-26 MED ORDER — PEGFILGRASTIM 6 MG/0.6ML ~~LOC~~ PSKT
6.0000 mg | PREFILLED_SYRINGE | Freq: Once | SUBCUTANEOUS | Status: AC
  Administered 2017-02-26: 6 mg via SUBCUTANEOUS
  Filled 2017-02-26: qty 0.6

## 2017-02-26 MED ORDER — HEPARIN SOD (PORK) LOCK FLUSH 100 UNIT/ML IV SOLN
500.0000 [IU] | Freq: Once | INTRAVENOUS | Status: DC | PRN
  Filled 2017-02-26: qty 5

## 2017-02-26 MED ORDER — CYANOCOBALAMIN 1000 MCG/ML IJ SOLN
1000.0000 ug | Freq: Once | INTRAMUSCULAR | Status: AC
  Administered 2017-02-26: 1000 ug via SUBCUTANEOUS

## 2017-02-26 MED ORDER — SODIUM CHLORIDE 0.9% FLUSH
10.0000 mL | INTRAVENOUS | Status: DC | PRN
  Filled 2017-02-26: qty 10

## 2017-02-26 MED ORDER — CYANOCOBALAMIN 1000 MCG/ML IJ SOLN
INTRAMUSCULAR | Status: AC
  Filled 2017-02-26: qty 1

## 2017-02-26 MED ORDER — PALONOSETRON HCL INJECTION 0.25 MG/5ML
0.2500 mg | Freq: Once | INTRAVENOUS | Status: AC
  Administered 2017-02-26: 0.25 mg via INTRAVENOUS

## 2017-02-26 MED ORDER — SODIUM CHLORIDE 0.9 % IV SOLN
Freq: Once | INTRAVENOUS | Status: AC
  Administered 2017-02-26: 16:00:00 via INTRAVENOUS

## 2017-02-26 MED ORDER — DEXAMETHASONE SODIUM PHOSPHATE 10 MG/ML IJ SOLN
10.0000 mg | Freq: Once | INTRAMUSCULAR | Status: AC
  Administered 2017-02-26: 10 mg via INTRAVENOUS

## 2017-02-26 MED ORDER — SODIUM CHLORIDE 0.9 % IV SOLN
453.0000 mg | Freq: Once | INTRAVENOUS | Status: AC
  Administered 2017-02-26: 450 mg via INTRAVENOUS
  Filled 2017-02-26: qty 45

## 2017-02-26 NOTE — Progress Notes (Signed)
Patient to get Xray today after treatment. This nurse called Dr. Irene Limbo to inquire about applying the Onpro device with the Xray scheduled. MD sais if applied to lower abdomen, onpro device should not be in the xray field and that it is ok to apply. Asked Pharmacy as well and they agreed with the advice of the MD.  Wylene Simmer, BSN, RN 02/26/2017 4:37 PM

## 2017-02-26 NOTE — Patient Instructions (Signed)
[facility] Discharge Instructions for Patients Receiving Chemotherapy  Today you received the following chemotherapy agents: Alimta and Carboplatin.  To help prevent nausea and vomiting after your treatment, we encourage you to take your nausea medication as directed.   If you develop nausea and vomiting that is not controlled by your nausea medication, call the clinic.   BELOW ARE SYMPTOMS THAT SHOULD BE REPORTED IMMEDIATELY:  *FEVER GREATER THAN 100.5 F  *CHILLS WITH OR WITHOUT FEVER  NAUSEA AND VOMITING THAT IS NOT CONTROLLED WITH YOUR NAUSEA MEDICATION  *UNUSUAL SHORTNESS OF BREATH  *UNUSUAL BRUISING OR BLEEDING  TENDERNESS IN MOUTH AND THROAT WITH OR WITHOUT PRESENCE OF ULCERS  *URINARY PROBLEMS  *BOWEL PROBLEMS  UNUSUAL RASH Items with * indicate a potential emergency and should be followed up as soon as possible.  Feel free to call the clinic you have any questions or concerns. The clinic phone number is [phone].  Please show the CHEMO ALERT CARD at check-in to the Emergency Department and triage nurse.   

## 2017-02-26 NOTE — Patient Instructions (Signed)
Thank you for choosing [facility] to provide your oncology and hematology care.  To afford each patient quality time with our providers, please arrive 30 minutes before your scheduled appointment time.  If you arrive late for your appointment, you may be asked to reschedule.  We strive to give you quality time with our providers, and arriving late affects you and other patients whose appointments are after yours.  If you are a no show for multiple scheduled visits, you may be dismissed from the clinic at the providers discretion.   Again, thank you for choosing [facility], our hope is that these requests will decrease the amount of time that you wait before being seen by our physicians.  ______________________________________________________________________ Should you have questions after your visit to the [facility], please contact our office at [phone] between the hours of 8:30 and 4:30 p.m.    Voicemails left after 4:30p.m will not be returned until the following business day.   For prescription refill requests, please have your pharmacy contact us directly.  Please also try to allow 48 hours for prescription requests.   Please contact the scheduling department for questions regarding scheduling.  For scheduling of procedures such as PET scans, CT scans, MRI, Ultrasound, etc please contact central scheduling at [phone].   Resources For Cancer Patients and Caregivers:  American Cancer Society:  800-227-2345  Can help patients locate various types of support and financial assistance Cancer Care: 1-800-813-HOPE (4673) Provides financial assistance, online support groups, medication/co-pay assistance.   [place] DSS:  336-641-3447 Where to apply for food stamps, Medicaid, and utility assistance Medicare Rights Center: 800-333-4114 Helps people with Medicare understand their rights and benefits, navigate the Medicare system, and secure the  quality healthcare they deserve SCAT: 336-333-6589 [employer] [employer]'s shared-ride transportation service for eligible riders who have a disability that prevents them from riding the fixed route bus.   For additional information on assistance programs please contact our social worker:   Grier Hock/Abigail Elmore:  [phone] 

## 2017-02-27 NOTE — Progress Notes (Signed)
Joshua White  HEMATOLOGY ONCOLOGY PROGRESS NOTE  Date of service: .02/26/2017  Patient Care Team: No Pcp Per Patient as PCP - General (General Practice)  CC: f/u for metastatic Non small cell lung cancer  Diagnosis: Newly diagnosed Metastatic lung adenocarcinoma (with M1a disease - pleural nodules and malignant pleural effusion)  Current Treatment: Carboplatin + Alimta s/p 2 cycles.  HPI  Joshua White is a 66 y.o. male who has been referred to [place] by Dr Aldine Contes, MD for evaluation and management of likely metastatic lung cancer.  Patient has a history of seizures, G6PD deficiency, right bundle branch block , heavy smoking history and hepatitis C and is just about done with his 12 week treatment with Harvoni for his hepatitis C. Patient notes that he has had progressive fatigue and anorexia for the last 2-3 months. He initially assigned his symptoms to his treatment for hepatitis C with Harvoni but was concerned when the symptoms persisted. He notes he has lost about 15-20 pounds over the last couple of months. She presented to the emergency room about a week ago with significant shortness of breath for 2-3 weeks  and was noted to have a large pleural effusion and had a therapeutic thoracentesis with removal of 1 L fluid which was sent for analysis. It was noted to be exudative with cytology concerning for atypical cells which are likely malignant.  Patient presented to his primary care physician in follow-up and was noted to have significant shortness of breath again and recurrent large left-sided pleural effusion for which she was admitted. He had a CT of the chest which showed an extremely large left-sided pleural effusion occupying majority of the left hemithorax with dense consolidation of most of the left lung. Diffuse infiltrating mass along the left side of the mediastinum measuring 3.6 cm and extending into much of the pleura and left hemithorax . He was also noted to have mediastinal  lymphadenopathy and a 2.1 cm nodular opacity in the right lung . Concern for possible metastases in the epicardial fat pad . Also had mildly prominent retroperitoneal lymph nodes measuring about 8 mm which were nonspecific . CT abdomen showed no other obvious metastatic disease.  Patient had an MRI of the brain which shows no evidence of metastatic disease. 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  Patient has had a repeat therapeutic thoracentesis today cytology from this is currently pending. Cardiothoracic surgery was consulted for his recurrent large pleural effusion and there are plans for a VATS procedure with pleural biopsy and possible placement of a Pleurx catheter on 12/20/2016.  Patient notes his breathing is somewhat better after the thoracentesis. Has some left sided chest pain.  He notes that he smoked about 1-2 packs a day since age 30 and quit about a year ago. No headaches no focal neurological deficits. No abdominal pain or distention. Weight loss of about 20 pounds over the last couple months.  Pathology results showed poorly differentiated lung adenocarcinoma.  PDL1 testing 0% Other foundation One results pending  INTERVAL HISTORY:  Patient is here for follow-up prior to his 3rd cycle of palliative carboplatin and Alimta chemotherapy . He notes that he is eating better. No nausea. Notes that his chest wall pain is well-controlled with the current pain medications. Has noted some increasing dyspnea on exertion. On clinical examination but of some concern for possible increase in left-sided pleural effusion. Plan is to get repeat x-ray after chemotherapy today. No fevers no  chills . No other reported new symptoms . No other acute new concerns. Recently spent some quality family time at the beach. He has gained 3-4 pounds over the last month.  REVIEW OF SYSTEMS:    10 Point review of systems of done and is negative except as  noted above.  . Past Medical History:  Diagnosis Date  . G6PD deficiency ([place])   . H/O orthostatic hypotension   . Pneumonia   . RBBB (right bundle branch block)   . Seizures ([place])     . Past Surgical History:  Procedure Laterality Date  . APPENDECTOMY    . CHEST TUBE INSERTION Left 12/20/2016   Procedure: INSERTION PLEURAL DRAINAGE CATHETER;  Surgeon: Grace Isaac, MD;  Location: [facility];  Service: Thoracic;  Laterality: Left;  . PLEURAL BIOPSY Left 12/20/2016   Procedure: PLEURAL BIOPSY;  Surgeon: Grace Isaac, MD;  Location: [facility];  Service: Thoracic;  Laterality: Left;  . US ECHOCARDIOGRAPHY  05/12/2008   EF 55-60%  . VIDEO ASSISTED THORACOSCOPY (VATS) W/TALC PLEUADESIS Left 12/20/2016   Procedure: VIDEO ASSISTED THORACOSCOPY (VATS) W/TALC PLEURADESIS;  Surgeon: Grace Isaac, MD;  Location: [facility];  Service: Thoracic;  Laterality: Left;  Joshua White VIDEO BRONCHOSCOPY N/A 12/20/2016   Procedure: VIDEO BRONCHOSCOPY;  Surgeon: Grace Isaac, MD;  Location: [facility] OR;  Service: Thoracic;  Laterality: N/A;    . Social History  Substance Use Topics  . Smoking status: Former Smoker    Packs/day: 2.00    Years: 30.00    Types: Cigars  . Smokeless tobacco: Never Used     Comment: Quit ~2017  . Alcohol use No     Comment: Former use    ALLERGIES:  is allergic to tuberculin purified protein derivative and sulfa antibiotics.  MEDICATIONS:  Current Outpatient Prescriptions  Medication Sig Dispense Refill  . aspirin EC 81 MG tablet Take 162 mg by mouth daily.    Joshua White dexamethasone (DECADRON) 4 MG tablet Take 1 tab two times a day the day before Alimta chemo. Take 2 tabs two times a day starting the day after chemo for 3 days. 30 tablet 1  . dexamethasone (DECADRON) 4 MG tablet Take 1 tablet (4 mg total) by mouth 2 (two) times daily. 10 tablet 0  . dronabinol (MARINOL) 5 MG capsule Take 1 capsule (5 mg total) by mouth 2 (two) times daily before a meal. 60 capsule 0  . folic acid  (FOLVITE) 1 MG tablet Take 1 tablet (1 mg total) by mouth daily. Start 5-7 days before Alimta chemotherapy. Continue until 21 days after Alimta completed. 100 tablet 3  . morphine (MS CONTIN) 30 MG 12 hr tablet Take 1 tablet (30 mg total) by mouth every 8 (eight) hours. 90 tablet 0  . ondansetron (ZOFRAN) 8 MG tablet Take 1 tablet (8 mg total) by mouth 2 (two) times daily as needed for refractory nausea / vomiting. Start on day 3 after chemo. 30 tablet 1  . oxyCODONE (OXY IR/ROXICODONE) 5 MG immediate release tablet Take 1-2 tablets (5-10 mg total) by mouth every 4 (four) hours as needed for severe pain. 60 tablet 0  . polyethylene glycol (MIRALAX) packet Take 17 g by mouth daily. 30 each 1  . prochlorperazine (COMPAZINE) 10 MG tablet Take 1 tablet (10 mg total) by mouth every 6 (six) hours as needed (Nausea or vomiting). 30 tablet 1  . senna-docusate (SENNA S) 8.6-50 MG tablet Take 2 tablets by mouth 2 (two) times daily. [place]  tablet 2   No current facility-administered medications for this visit.     PHYSICAL EXAMINATION: ECOG PERFORMANCE STATUS: 2 - Symptomatic, <50% confined to bed  . Vitals:   02/26/17 1350  BP: 130/68  Pulse: 90  Resp: 18  Temp: 97.5 F (36.4 C)    Filed Weights   02/26/17 1350  Weight: 141 lb 1 oz (64 kg)   .Body mass index is 20.83 kg/m. . Wt Readings from Last 3 Encounters:  02/26/17 141 lb 1 oz (64 kg)  02/06/17 139 lb (63 kg)  01/25/17 138 lb (62.6 kg)    GENERAL:alert, in no acute distress and comfortable SKIN: no acute rashes, no significant lesions EYES: conjunctiva are pink and non-injected, sclera anicteric OROPHARYNX: MMM, no exudates, no oropharyngeal erythema or ulceration NECK: supple, no JVD LYMPH:  no palpable lymphadenopathy in the cervical, axillary or inguinal regions LUNGS:Decreased at entry left lung base Up to about half the chest wall posteriorly. HEART: regular rate & rhythm ABDOMEN:  normoactive bowel sounds , non tender, not  distended. Extremity: no pedal edema PSYCH: alert & oriented x 3 with fluent speech NEURO: no focal motor/sensory deficits  LABORATORY DATA:   I have reviewed the data as listed  . CBC Latest Ref Rng & Units 02/26/2017 02/06/2017 01/25/2017  WBC 4.0 - 10.3 10e3/uL 3.9(L) 14.0(H) 7.0  Hemoglobin 13.0 - 17.1 g/dL 11.4(L) 10.4(L) 10.2(L)  Hematocrit 38.4 - 49.9 % 34.5(L) 32.4(L) 31.9(L)  Platelets 140 - 400 10e3/uL 170 254 81(L)    . CMP Latest Ref Rng & Units 02/26/2017 02/06/2017 01/25/2017  Glucose 70 - 140 mg/dl 132 97 104  BUN 7.0 - 26.0 mg/dL 13.5 20.5 14.4  Creatinine 0.7 - 1.3 mg/dL 0.9 0.9 0.9  Sodium 136 - 145 mEq/L 133(L) 137 132(L)  Potassium 3.5 - 5.1 mEq/L 4.9 4.6 4.2  Chloride 101 - 111 mmol/L - - -  CO2 22 - 29 mEq/L 22 21(L) 23  Calcium 8.4 - 10.4 mg/dL 10.5(H) 10.3 9.9  Total Protein 6.4 - 8.3 g/dL 9.4(H) 9.0(H) 8.9(H)  Total Bilirubin 0.20 - 1.20 mg/dL 0.52 0.37 0.60  Alkaline Phos 40 - 150 U/L 105 89 106  AST 5 - 34 U/L 18 15 22   ALT 0 - 55 U/L 9 10 10           RADIOGRAPHIC STUDIES: I have personally reviewed the radiological images as listed and agreed with the findings in the report. No results found.  ASSESSMENT & PLAN:   66 year old male with  #1 Newly diagnosed Metastatic lung adenocarcinoma (with M1a disease - pleural nodules and malignant pleural effusion) PDL1 0% Negative for EGFR, ALK, Ros1 and BRAF mutations. Patient is now status post 2 cycles of palliative carboplatin and Alimta chemotherapy which she has tolerated well.  #2 Recurrent left-sided malignant pleural effusion -now s/p pleurex catheter placement and drainage. Pleurx catheter removed on outpatient follow-up with cardiothoracic surgery . No overt evidence of metastatic disease outside of the chest cavity or in the brain. Pathology showed poorly differentiated adenocarcinoma.  #3 some increased dyspnea on exertion - concern for recurrent left-sided effusion. Plan -no  prohibitive toxicity from first 2 cycles of carboplatin/Alimta -Labs today are stable -Patient is appropriate to proceed with his 3rd cycle of carboplatin/Alimta palliative chemotherapy with G-CSF support. -We'll get a chest x-ray after chemotherapy today to evaluate left-sided pleural effusion and to determine if ultrasound-guided aspiration is necessary to help with his dyspnea on exertion. - repeat staging CT chest abdomen pelvis in  2 weeks.  #2 ex-smoker with more than 100-pack-year history of smoking  -he has quit smoking about a year ago  #3 exalcohol abuse and remote IVDU- patient notes no recent IV drug use and has been sober from alcohol use from a year.  #4 hepatitis C has finished treatment with harvonisoon before being diagnosed with lung cancer.  #5 moderate protein calorie malnutrition and anorexia due to malignancy and recent treatment for hepatitis C. Has gained about 4 pounds in the last 1 month -Dietitian referral given -continue on Marinol 5 mg by mouth twice a day-- patient notes some improvement in his appetite.  #6 cancer related pain - better controlled -Continue MS contin 46m po q8h  -Oxycodone every 4 hours as needed for breakthrough pain -Senna S and MiraLAX for bowel prophylaxis.   #7 symptomatic anemia- stable  #8 Thrombocytopenia - resolved platelet counts are normal today -monitor   CXR after chemotherapy today CT chest/abd in 2 weeks RTC for next cycle of chemotherapy in 3 weeks with labs RTC with Dr KIrene Limboin 3weeks C4D1 with labs.  I spent 20 minutes counseling the patient face to face. The total time spent in the appointment was 25 minutes and more than 50% was on counseling and direct patient cares.    GSullivan LoneMD MRocklakeAAHIVMS SPheLPs County Regional Medical CenterCTeton Valley Health CareHematology/Oncology Physician CCaguas Ambulatory Surgical [facility] Inc (Office):       [phone](Work cell):  [phone](Fax):           [phone]

## 2017-03-01 ENCOUNTER — Telehealth: Payer: Self-pay | Admitting: Hematology

## 2017-03-01 NOTE — Telephone Encounter (Signed)
Scheduled appt per 4/30 los - patient aware of appt date and time and reminder letter sent.

## 2017-03-02 ENCOUNTER — Inpatient Hospital Stay (HOSPITAL_COMMUNITY)
Admission: EM | Admit: 2017-03-02 | Discharge: 2017-03-03 | DRG: 181 | Disposition: A | Discharge status: Home / Self Care | Payer: Medicare Other | Attending: Internal Medicine | Admitting: Internal Medicine

## 2017-03-02 ENCOUNTER — Observation Stay (HOSPITAL_COMMUNITY): Payer: Medicare Other

## 2017-03-02 ENCOUNTER — Emergency Department (HOSPITAL_COMMUNITY): Payer: Medicare Other

## 2017-03-02 ENCOUNTER — Other Ambulatory Visit: Payer: Self-pay

## 2017-03-02 ENCOUNTER — Encounter (HOSPITAL_COMMUNITY): Payer: Self-pay | Admitting: Emergency Medicine

## 2017-03-02 DIAGNOSIS — C3492 Malignant neoplasm of unspecified part of left bronchus or lung: Secondary | ICD-10-CM | POA: Diagnosis not present

## 2017-03-02 DIAGNOSIS — Z801 Family history of malignant neoplasm of trachea, bronchus and lung: Secondary | ICD-10-CM

## 2017-03-02 DIAGNOSIS — K219 Gastro-esophageal reflux disease without esophagitis: Secondary | ICD-10-CM | POA: Diagnosis present

## 2017-03-02 DIAGNOSIS — R0602 Shortness of breath: Secondary | ICD-10-CM | POA: Diagnosis present

## 2017-03-02 DIAGNOSIS — E7401 von Gierke disease: Secondary | ICD-10-CM | POA: Diagnosis not present

## 2017-03-02 DIAGNOSIS — Z9221 Personal history of antineoplastic chemotherapy: Secondary | ICD-10-CM | POA: Diagnosis not present

## 2017-03-02 DIAGNOSIS — B182 Chronic viral hepatitis C: Secondary | ICD-10-CM | POA: Diagnosis present

## 2017-03-02 DIAGNOSIS — J910 Malignant pleural effusion: Secondary | ICD-10-CM | POA: Diagnosis not present

## 2017-03-02 DIAGNOSIS — E878 Other disorders of electrolyte and fluid balance, not elsewhere classified: Secondary | ICD-10-CM | POA: Diagnosis not present

## 2017-03-02 DIAGNOSIS — R0600 Dyspnea, unspecified: Secondary | ICD-10-CM

## 2017-03-02 DIAGNOSIS — J90 Pleural effusion, not elsewhere classified: Secondary | ICD-10-CM | POA: Diagnosis present

## 2017-03-02 DIAGNOSIS — Z881 Allergy status to other antibiotic agents status: Secondary | ICD-10-CM | POA: Diagnosis not present

## 2017-03-02 DIAGNOSIS — E871 Hypo-osmolality and hyponatremia: Secondary | ICD-10-CM | POA: Diagnosis not present

## 2017-03-02 DIAGNOSIS — D630 Anemia in neoplastic disease: Secondary | ICD-10-CM | POA: Diagnosis not present

## 2017-03-02 DIAGNOSIS — Z7982 Long term (current) use of aspirin: Secondary | ICD-10-CM

## 2017-03-02 DIAGNOSIS — B009 Herpesviral infection, unspecified: Secondary | ICD-10-CM | POA: Diagnosis not present

## 2017-03-02 DIAGNOSIS — Z87891 Personal history of nicotine dependence: Secondary | ICD-10-CM | POA: Diagnosis not present

## 2017-03-02 DIAGNOSIS — Z79899 Other long term (current) drug therapy: Secondary | ICD-10-CM

## 2017-03-02 DIAGNOSIS — D75A Glucose-6-phosphate dehydrogenase (G6PD) deficiency without anemia: Secondary | ICD-10-CM | POA: Diagnosis present

## 2017-03-02 DIAGNOSIS — F1021 Alcohol dependence, in remission: Secondary | ICD-10-CM | POA: Diagnosis not present

## 2017-03-02 DIAGNOSIS — I509 Heart failure, unspecified: Secondary | ICD-10-CM | POA: Diagnosis present

## 2017-03-02 DIAGNOSIS — Z8249 Family history of ischemic heart disease and other diseases of the circulatory system: Secondary | ICD-10-CM

## 2017-03-02 DIAGNOSIS — Z887 Allergy status to serum and vaccine status: Secondary | ICD-10-CM

## 2017-03-02 HISTORY — DX: Malignant neoplasm of unspecified part of unspecified bronchus or lung: C34.90

## 2017-03-02 LAB — BASIC METABOLIC PANEL
ANION GAP: 8 (ref 5–15)
BUN: 22 mg/dL — ABNORMAL HIGH (ref 6–20)
CHLORIDE: 101 mmol/L (ref 101–111)
CO2: 24 mmol/L (ref 22–32)
CREATININE: 0.77 mg/dL (ref 0.61–1.24)
Calcium: 9.4 mg/dL (ref 8.9–10.3)
GFR calc non Af Amer: >60 mL/min (ref >60)
Glucose, Bld: 87 mg/dL (ref 65–99)
Potassium: 4.2 mmol/L (ref 3.5–5.1)
Sodium: 133 mmol/L — ABNORMAL LOW (ref 135–145)

## 2017-03-02 LAB — CBC
HEMATOCRIT: 30.7 % — AB (ref 39.0–52.0)
HEMOGLOBIN: 10.2 g/dL — AB (ref 13.0–17.0)
MCH: 32.6 pg (ref 26.0–34.0)
MCHC: 33.2 g/dL (ref 30.0–36.0)
MCV: 98.1 fL (ref 78.0–100.0)
PLATELETS: 206 10*3/uL (ref 150–400)
RBC: 3.13 MIL/uL — AB (ref 4.22–5.81)
RDW: 18.8 % — ABNORMAL HIGH (ref 11.5–15.5)
WBC: 8 10*3/uL (ref 4.0–10.5)

## 2017-03-02 LAB — I-STAT TROPONIN, ED: Troponin i, poc: 0 ng/mL (ref 0.00–0.08)

## 2017-03-02 MED ORDER — DEXAMETHASONE 4 MG PO TABS
4.0000 mg | ORAL_TABLET | Freq: Two times a day (BID) | ORAL | Status: DC
  Administered 2017-03-02 – 2017-03-03 (×2): 4 mg via ORAL
  Filled 2017-03-02 (×3): qty 1

## 2017-03-02 MED ORDER — SENNOSIDES-DOCUSATE SODIUM 8.6-50 MG PO TABS
2.0000 | ORAL_TABLET | Freq: Two times a day (BID) | ORAL | Status: DC
  Filled 2017-03-02 (×3): qty 2

## 2017-03-02 MED ORDER — OXYCODONE HCL 5 MG PO TABS
5.0000 mg | ORAL_TABLET | ORAL | Status: DC | PRN
  Administered 2017-03-02 – 2017-03-03 (×5): 10 mg via ORAL
  Filled 2017-03-02 (×5): qty 2

## 2017-03-02 MED ORDER — MORPHINE SULFATE ER 30 MG PO TBCR
30.0000 mg | EXTENDED_RELEASE_TABLET | Freq: Three times a day (TID) | ORAL | Status: DC
  Administered 2017-03-02 – 2017-03-03 (×4): 30 mg via ORAL
  Filled 2017-03-02 (×4): qty 1

## 2017-03-02 MED ORDER — POLYETHYLENE GLYCOL 3350 17 G PO PACK
17.0000 g | PACK | Freq: Every day | ORAL | Status: DC
  Administered 2017-03-02: 17 g via ORAL
  Filled 2017-03-02 (×2): qty 1

## 2017-03-02 MED ORDER — ONDANSETRON HCL 4 MG/2ML IJ SOLN: 4.0000 mg | Freq: Four times a day (QID) | INTRAMUSCULAR | Status: DC | PRN

## 2017-03-02 MED ORDER — PROCHLORPERAZINE MALEATE 10 MG PO TABS: 10.0000 mg | ORAL_TABLET | Freq: Four times a day (QID) | ORAL | Status: DC | PRN

## 2017-03-02 MED ORDER — FOLIC ACID 1 MG PO TABS
1.0000 mg | ORAL_TABLET | Freq: Every day | ORAL | Status: DC
  Administered 2017-03-02 – 2017-03-03 (×2): 1 mg via ORAL
  Filled 2017-03-02 (×2): qty 1

## 2017-03-02 MED ORDER — DRONABINOL 5 MG PO CAPS
5.0000 mg | ORAL_CAPSULE | Freq: Two times a day (BID) | ORAL | Status: DC
  Administered 2017-03-02 – 2017-03-03 (×2): 5 mg via ORAL
  Filled 2017-03-02 (×2): qty 1

## 2017-03-02 MED ORDER — ASPIRIN EC 81 MG PO TBEC
162.0000 mg | DELAYED_RELEASE_TABLET | Freq: Every day | ORAL | Status: DC
  Administered 2017-03-02 – 2017-03-03 (×2): 162 mg via ORAL
  Filled 2017-03-02 (×2): qty 2

## 2017-03-02 MED ORDER — ONDANSETRON HCL 4 MG PO TABS: 4.0000 mg | ORAL_TABLET | Freq: Four times a day (QID) | ORAL | Status: DC | PRN

## 2017-03-02 MED ORDER — ONDANSETRON HCL 8 MG PO TABS: 8.0000 mg | ORAL_TABLET | Freq: Two times a day (BID) | ORAL | Status: DC | PRN

## 2017-03-02 NOTE — H&P (Signed)
History and Physical    Joshua White:[id number] DOB: [birth year] DOA: 03/02/2017  I have briefly reviewed the patient's prior medical records in [place]  PCP: PROVIDER NOT [place]  Patient coming from: Home  Chief Complaint: Shortness of breath  HPI: Joshua White is a 66 y.o. male with medical history significant of metastatic lung cancer currently undergoing chemotherapy, his oncologist is Dr. Irene Limbo, presents to the emergency room with a chief complaint of shortness of breath.  Patient has been having a recurrent malignant pleural effusion requiring intermittent thoracentesis at one point had a Pleurx catheter placed.  He was seen by Dr. Pia Mau with thoracic surgery couple months ago and he went talc pleurodesis with Pleurx drain removal.  Patient was subsequently started on chemotherapy.  He comes in today stating that over the last week to a few days he has been having progressive shortness of breath, he feels okay if he is at rest however when he starts ambulating he gets pretty dyspneic.  He also has been complaining of left-sided chest pain worse with inspiration where he had the surgery done.  He denies any fever or chills, he denies any cough or chest congestion.  He denies any substernal chest pain.  He has no abdominal pain, no nausea or vomiting.  He states that he tolerates chemotherapy well without too much side effects.   ED Course: In the emergency room his vital signs are stable, he is afebrile, normotensive, his blood work is unremarkable.  Chest x-ray showed left-sided pleural effusion which is slightly increased compared to prior chest x-ray.  TRH was asked for admission for significant dyspnea  Review of Systems: As per HPI otherwise 10 point review of systems negative.   Past Medical History:  Diagnosis Date  . G6PD deficiency ([place])   . H/O orthostatic hypotension   . Lung cancer ([place])   . Pneumonia   . RBBB (right bundle branch block)   . Seizures ([place])       Past Surgical History:  Procedure Laterality Date  . APPENDECTOMY    . CHEST TUBE INSERTION Left 12/20/2016   Procedure: INSERTION PLEURAL DRAINAGE CATHETER;  Surgeon: Grace Isaac, MD;  Location: [facility];  Service: Thoracic;  Laterality: Left;  . PLEURAL BIOPSY Left 12/20/2016   Procedure: PLEURAL BIOPSY;  Surgeon: Grace Isaac, MD;  Location: [facility];  Service: Thoracic;  Laterality: Left;  . US ECHOCARDIOGRAPHY  05/12/2008   EF 55-60%  . VIDEO ASSISTED THORACOSCOPY (VATS) W/TALC PLEUADESIS Left 12/20/2016   Procedure: VIDEO ASSISTED THORACOSCOPY (VATS) W/TALC PLEURADESIS;  Surgeon: Grace Isaac, MD;  Location: [facility];  Service: Thoracic;  Laterality: Left;  [name] VIDEO BRONCHOSCOPY N/A 12/20/2016   Procedure: VIDEO BRONCHOSCOPY;  Surgeon: Grace Isaac, MD;  Location: [facility];  Service: Thoracic;  Laterality: N/A;     reports that he has quit smoking. His smoking use included Cigars. He has a 60.00 pack-year smoking history. He has never used smokeless tobacco. He reports that he does not drink alcohol or use drugs.  Allergies  Allergen Reactions  . Tuberculin Purified Protein Derivative Swelling and Other (See Comments)    Reaction:  Unspecified swelling reaction   . Sulfa Antibiotics Other (See Comments)    Reaction:  Causes pt to pass out     Family History  Problem Relation Age of Onset  . Lung cancer Mother   . Brain cancer Mother   . Heart disease Father   .  Hypertension Father   . Heart attack Father     Prior to Admission medications   Medication Sig Start Date End Date Taking? Authorizing Provider  aspirin EC 81 MG tablet Take 162 mg by mouth daily.   Yes Historical Provider, MD  dexamethasone (DECADRON) 4 MG tablet Take 1 tablet (4 mg total) by mouth 2 (two) times daily. 01/25/17  Yes Brunetta Genera, MD  dronabinol (MARINOL) 5 MG capsule Take 1 capsule (5 mg total) by mouth 2 (two) times daily before a meal. 02/06/17  Yes Brunetta Genera, MD  folic  acid (FOLVITE) 1 MG tablet Take 1 tablet (1 mg total) by mouth daily. Start 5-7 days before Alimta chemotherapy. Continue until 21 days after Alimta completed. 01/16/17  Yes Brunetta Genera, MD  morphine (MS CONTIN) 30 MG 12 hr tablet Take 1 tablet (30 mg total) by mouth every 8 (eight) hours. 02/06/17  Yes Brunetta Genera, MD  ondansetron (ZOFRAN) 8 MG tablet Take 1 tablet (8 mg total) by mouth 2 (two) times daily as needed for refractory nausea / vomiting. Start on day 3 after chemo. 02/26/17  Yes Brunetta Genera, MD  oxyCODONE (OXY IR/ROXICODONE) 5 MG immediate release tablet Take 1-2 tablets (5-10 mg total) by mouth every 4 (four) hours as needed for severe pain. 02/19/17  Yes Brunetta Genera, MD  polyethylene glycol [facility]) packet Take 17 g by mouth daily. 01/09/17  Yes Gautam Juleen China, MD  prochlorperazine (COMPAZINE) 10 MG tablet Take 1 tablet (10 mg total) by mouth every 6 (six) hours as needed (Nausea or vomiting). 01/16/17  Yes Brunetta Genera, MD  senna-docusate (SENNA S) 8.6-50 MG tablet Take 2 tablets by mouth 2 (two) times daily. 01/09/17  Yes Brunetta Genera, MD    Physical Exam: Vitals:   03/02/17 0949 03/02/17 1014 03/02/17 1100  BP: (!) 155/95 (!) 152/92 (!) 146/85  Pulse: 88 82 79  Resp: 16 (!) 21 15  Temp: 98.4 F (36.9 C)    TempSrc: Oral    SpO2: 97% 99% 98%   Constitutional: NAD, calm, comfortable Eyes: PERRL, lids and conjunctivae normal ENMT: Mucous membranes are moist. Posterior pharynx clear of any exudate or lesions. Neck: normal, supple, no masses, no thyromegaly Respiratory: clear to auscultation bilaterally, no wheezing, no crackles.  Decreased breath sounds at the left lower base Cardiovascular: Regular rate and rhythm, no murmurs / rubs / gallops. No extremity edema. 2+ pedal pulses.  Abdomen: no tenderness, no masses palpated. Bowel sounds positive.  Musculoskeletal: no clubbing / cyanosis. Normal muscle tone.  Skin: no rashes,  lesions, ulcers. No induration Neurologic: CN 2-12 grossly intact. Strength 5/5 in all 4.  Psychiatric: Normal judgment and insight. Alert and oriented x 3. Normal mood.   Labs on Admission: I have personally reviewed following labs and imaging studies  CBC:  Recent Labs Lab 02/26/17 1319 03/02/17 0958  WBC 3.9* 8.0  NEUTROABS 3.4  --   HGB 11.4* 10.2*  HCT 34.5* 30.7*  MCV 97.5 98.1  PLT 170 326   Basic Metabolic Panel:  Recent Labs Lab 02/26/17 1319 03/02/17 0958  NA 133* 133*  K 4.9 4.2  CL  --  101  CO2 22 24  GLUCOSE 132 87  BUN 13.5 22*  CREATININE 0.9 0.77  CALCIUM 10.5* 9.4   GFR: Estimated Creatinine Clearance: 81.1 mL/min (by C-G formula based on SCr of 0.77 mg/dL). Liver Function Tests:  Recent Labs Lab 02/26/17 1319  AST 18  ALT 9  ALKPHOS 105  BILITOT 0.52  PROT 9.4*  ALBUMIN 3.2*   No results for input(s): LIPASE, AMYLASE in the last 168 hours. No results for input(s): AMMONIA in the last 168 hours. Coagulation Profile: No results for input(s): INR, PROTIME in the last 168 hours. Cardiac Enzymes: No results for input(s): CKTOTAL, CKMB, CKMBINDEX, TROPONINI in the last 168 hours. BNP (last 3 results) No results for input(s): PROBNP in the last 8760 hours. HbA1C: No results for input(s): HGBA1C in the last 72 hours. CBG: No results for input(s): GLUCAP in the last 168 hours. Lipid Profile: No results for input(s): CHOL, HDL, LDLCALC, TRIG, CHOLHDL, LDLDIRECT in the last 72 hours. Thyroid Function Tests: No results for input(s): TSH, T4TOTAL, FREET4, T3FREE, THYROIDAB in the last 72 hours. Anemia Panel: No results for input(s): VITAMINB12, FOLATE, FERRITIN, TIBC, IRON, RETICCTPCT in the last 72 hours. Urine analysis:    Component Value Date/Time   COLORURINE YELLOW 02/23/2011 [place] 02/23/2011 1245   LABSPEC 1.017 02/23/2011 1245   PHURINE 8.0 02/23/2011 1245   GLUCOSEU NEGATIVE 02/23/2011 1245   HGBUR NEGATIVE  02/23/2011 1245   [place] 02/23/2011 1245   KETONESUR NEGATIVE 02/23/2011 1245   PROTEINUR NEGATIVE 02/23/2011 1245   UROBILINOGEN 2.0 (H) 02/23/2011 1245   NITRITE NEGATIVE 02/23/2011 1245   LEUKOCYTESUR SMALL (A) 02/23/2011 1245     Radiological Exams on Admission: Dg Chest 2 View  Result Date: 03/02/2017 CLINICAL DATA:  Shortness of breath.  Left pleural effusion. EXAM: CHEST  2 VIEW COMPARISON:  01/25/2017 FINDINGS: Cardiomediastinal silhouette is normal. Mediastinal contours appear intact. There is no evidence of pneumothorax. There is slightly enlarged left pleural effusion. The known left perihilar mass has stable radiographic appearance. Left lower lobe atelectasis versus airspace consolidation. The right basilar nodular opacity seen by CT in February 2018 is not appreciated radiographically. Osseous structures are without acute abnormality. Soft tissues are grossly normal. IMPRESSION: Slightly enlarged left pleural effusion, compared to patient's last radiograph dated 01/25/2017. 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 Electronically Signed   By: Fidela Salisbury M.D.   On: 03/02/2017 11:32    EKG: Independently reviewed. 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 Cain Saupe nurse, he is in surgery during this admission but was able to review the films, per his RN, this appears to look better than in the past, and he would not suggest any additional surgical interventions at this point -His dyspnea is likely multifactorial due to moderate pleural effusion as well as underlying lung cancer -we will obtain ultrasound-guided  thoracentesis  Metastatic lung cancer -We will tag Dr. Irene Limbo to chart  Anemia -Likely due to cancer and chemotherapy    DVT prophylaxis: SCDs Code Status: Full code Family Communication: Son at bedside Disposition Plan: Admit to Time, likely home when ready Consults called: Thoracic surgery (over the phone)    Admission status: Observation  At the point of initial evaluation, it is my clinical opinion that admission for OBSERVATION is reasonable and necessary because the patient's presenting complaints in the context of their chronic conditions represent sufficient risk of deterioration or significant morbidity to constitute reasonable grounds for close observation  in the hospital setting, but that the patient may be medically stable for discharge from the hospital within 24 to 48 hours.    Marzetta Board, MD [facility] Pager [phone]  If 7PM-7AM, please contact night-coverage [email or web address] Password [email or web address]  03/02/2017, 12:30 PM

## 2017-03-02 NOTE — Progress Notes (Signed)
Patient brought to [place] department for possible thoracentesis of left-sided pleural effusion.  Limited US of the left chest shows a very small amount of pleural fluid which is not amenable to thoracentesis.  MD notified  Brynda Greathouse, MS RD PA-C 4:21 PM

## 2017-03-02 NOTE — ED Provider Notes (Signed)
[facility] DEPT Provider Note   CSN: [id number] Arrival date & time: 03/02/17  8099     History   Chief Complaint Chief Complaint  Patient presents with  . Shortness of Breath  . Chest Pain    HPI ADYEN BIFULCO is a 66 y.o. male.  Patient is a 66 year old male with a history of lung cancer currently undergoing his third round of chemotherapy with prior left pleural effusion at one point requiring a drain presenting today with sudden onset of left-sided chest pain and shortness of breath that started at 8 AM this morning. Patient states on Monday when he was getting his chemotherapy his hematologist felt that he heard decreased breath sounds and ordered a chest x-ray however he did not receive that. He had not felt short of breath or chest pain until this morning. He denies any leg swelling, nausea, vomiting. He has had an ongoing nonproductive cough but that is unchanged. He denies fever. He states this pain which is sharp and comes in waves is the same kind of pain he had when he had the buildup of fluid.   The history is provided by the patient.  Shortness of Breath  This is a recurrent problem. The average episode lasts 4 hours. The problem occurs continuously.The current episode started 3 to 5 hours ago. The problem has not changed since onset.Associated symptoms include cough and chest pain. Pertinent negatives include no fever, no headaches, no sputum production, no wheezing, no vomiting and no leg swelling. It is unknown what precipitated the problem. He has tried nothing for the symptoms. The treatment provided no relief. He has had prior hospitalizations. He has had prior ED visits. Associated medical issues comments: hx of lung CA currently on chemo with prior pleural effusion..  Chest Pain   Associated symptoms include cough and shortness of breath. Pertinent negatives include no fever, no headaches, no sputum production and no vomiting.    Past Medical History:  Diagnosis  Date  . G6PD deficiency ([place])   . H/O orthostatic hypotension   . Lung cancer ([place])   . Pneumonia   . RBBB (right bundle branch block)   . Seizures [facility])     Patient Active Problem List   Diagnosis Date Noted  . Goals of care, counseling/discussion 01/11/2017  . Adenocarcinoma of left lung, stage 4 ([place]) 01/09/2017  . Lung cancer ([place])   . Malignant neoplasm of overlapping sites of left lung ([place])   . SOB (shortness of breath)   . History of exposure to asbestos 12/19/2016  . History of cigarette smoking 12/19/2016  . Pleural effusion on left 12/18/2016  . Chronic hepatitis C without hepatic coma ([place]) 09/18/2016  . Sessile colonic polyp 09/18/2016  . 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 G6PD deficiency ([place]) 09/18/2016  . Esophageal reflux 09/18/2016  . Syncope 09/18/2016  . Chronic alcoholism in remission ([place]) 09/18/2016  . Osteoarthritis of left shoulder 09/18/2016    Past Surgical History:  Procedure Laterality Date  . APPENDECTOMY    . CHEST TUBE INSERTION Left 12/20/2016   Procedure: INSERTION PLEURAL DRAINAGE CATHETER;  Surgeon: Grace Isaac, MD;  Location: [facility];  Service: Thoracic;  Laterality: Left;  . PLEURAL BIOPSY Left 12/20/2016   Procedure: PLEURAL BIOPSY;  Surgeon: Grace Isaac, MD;  Location: [facility];  Service: Thoracic;  Laterality: Left;  . US ECHOCARDIOGRAPHY  05/12/2008   EF 55-60%  . VIDEO ASSISTED THORACOSCOPY (VATS) W/TALC PLEUADESIS Left 12/20/2016   Procedure:  VIDEO ASSISTED THORACOSCOPY (VATS) W/TALC PLEURADESIS;  Surgeon: Grace Isaac, MD;  Location: [facility];  Service: Thoracic;  Laterality: Left;  [name] VIDEO BRONCHOSCOPY N/A 12/20/2016   Procedure: VIDEO BRONCHOSCOPY;  Surgeon: Grace Isaac, MD;  Location: [facility] OR;  Service: Thoracic;  Laterality: N/A;       Home Medications    Prior to Admission medications   Medication Sig Start Date End Date Taking? Authorizing Provider  aspirin EC 81 MG tablet Take 162 mg by mouth  daily.   Yes Historical Provider, MD  dexamethasone (DECADRON) 4 MG tablet Take 1 tablet (4 mg total) by mouth 2 (two) times daily. 01/25/17  Yes Brunetta Genera, MD  dronabinol (MARINOL) 5 MG capsule Take 1 capsule (5 mg total) by mouth 2 (two) times daily before a meal. 02/06/17  Yes Brunetta Genera, MD  folic acid (FOLVITE) 1 MG tablet Take 1 tablet (1 mg total) by mouth daily. Start 5-7 days before Alimta chemotherapy. Continue until 21 days after Alimta completed. 01/16/17  Yes Brunetta Genera, MD  morphine (MS CONTIN) 30 MG 12 hr tablet Take 1 tablet (30 mg total) by mouth every 8 (eight) hours. 02/06/17  Yes Brunetta Genera, MD  ondansetron (ZOFRAN) 8 MG tablet Take 1 tablet (8 mg total) by mouth 2 (two) times daily as needed for refractory nausea / vomiting. Start on day 3 after chemo. 02/26/17  Yes Brunetta Genera, MD  oxyCODONE (OXY IR/ROXICODONE) 5 MG immediate release tablet Take 1-2 tablets (5-10 mg total) by mouth every 4 (four) hours as needed for severe pain. 02/19/17  Yes Brunetta Genera, MD  polyethylene glycol [facility]) packet Take 17 g by mouth daily. 01/09/17  Yes Gautam Juleen China, MD  prochlorperazine (COMPAZINE) 10 MG tablet Take 1 tablet (10 mg total) by mouth every 6 (six) hours as needed (Nausea or vomiting). 01/16/17  Yes Brunetta Genera, MD  senna-docusate (SENNA S) 8.6-50 MG tablet Take 2 tablets by mouth 2 (two) times daily. 01/09/17  Yes Gautam Juleen China, MD    Family History Family History  Problem Relation Age of Onset  . Lung cancer Mother   . Brain cancer Mother   . Heart disease Father   . Hypertension Father   . Heart attack Father     Social History Social History  Substance Use Topics  . Smoking status: Former Smoker    Packs/day: 2.00    Years: 30.00    Types: Cigars  . Smokeless tobacco: Never Used     Comment: Quit ~2017  . Alcohol use No     Comment: Former use     Allergies   Tuberculin purified protein  derivative and Sulfa antibiotics   Review of Systems Review of Systems  Constitutional: Negative for fever.  Respiratory: Positive for cough and shortness of breath. Negative for sputum production and wheezing.   Cardiovascular: Positive for chest pain. Negative for leg swelling.  Gastrointestinal: Negative for vomiting.  Neurological: Negative for headaches.  All other systems reviewed and are negative.    Physical Exam Updated Vital Signs BP (!) 152/92 (BP Location: Left Arm)   Pulse 82   Temp 98.4 F (36.9 C) (Oral)   Resp (!) 21   SpO2 99%   Physical Exam  Constitutional: He is oriented to person, place, and time. He appears well-developed and well-nourished. No distress.  HENT:  Head: Normocephalic and atraumatic.  Mouth/Throat: Oropharynx is clear and moist.  Alopecia  Eyes: EOM  are normal. Pupils are equal, round, and reactive to light.  Pale conjunctiva  Neck: Normal range of motion. Neck supple.  Cardiovascular: Normal rate, regular rhythm and intact distal pulses.   No murmur heard. Pulmonary/Chest: Effort normal. No respiratory distress. He has decreased breath sounds in the left lower field. He has no wheezes. He has no rales. He exhibits no tenderness.  Abdominal: Soft. He exhibits no distension. There is no tenderness. There is no rebound and no guarding.  Musculoskeletal: Normal range of motion. He exhibits no edema or tenderness.  Neurological: He is alert and oriented to person, place, and time.  Skin: Skin is warm and dry. No rash noted. No erythema.  Psychiatric: He has a normal mood and affect. His behavior is normal.  Nursing note and vitals reviewed.    ED Treatments / Results  Labs (all labs ordered are listed, but only abnormal results are displayed) Labs Reviewed  BASIC METABOLIC PANEL - Abnormal; Notable for the following:       Result Value   Sodium 133 (*)    BUN 22 (*)    All other components within normal limits  CBC - Abnormal;  Notable for the following:    RBC 3.13 (*)    Hemoglobin 10.2 (*)    HCT 30.7 (*)    RDW 18.8 (*)    All other components within normal limits  I-STAT TROPOININ, ED    EKG  EKG Interpretation  Date/Time:  Friday Mar 02 2017 09:45:46 EDT Ventricular Rate:  86 PR Interval:    QRS Duration: 88 QT Interval:  341 QTC Calculation: 408 R Axis:   -28 Text Interpretation:  Sinus rhythm Borderline left axis deviation Abnormal R-wave progression, early transition Borderline ST elevation, anterior leads No significant change since last tracing Confirmed by Maryan Rued  MD, Loree Fee ([id number]) on 03/02/2017 10:30:51 AM       Radiology Dg Chest 2 View  Result Date: 03/02/2017 CLINICAL DATA:  Shortness of breath.  Left pleural effusion. EXAM: CHEST  2 VIEW COMPARISON:  01/25/2017 FINDINGS: Cardiomediastinal silhouette is normal. Mediastinal contours appear intact. There is no evidence of pneumothorax. There is slightly enlarged left pleural effusion. The known left perihilar mass has stable radiographic appearance. Left lower lobe atelectasis versus airspace consolidation. The right basilar nodular opacity seen by CT in February 2018 is not appreciated radiographically. Osseous structures are without acute abnormality. Soft tissues are grossly normal. IMPRESSION: Slightly enlarged left pleural effusion, compared to patient's last radiograph dated 01/25/2017. 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 Electronically Signed   By: Fidela Salisbury M.D.   On: 03/02/2017 11:32    Procedures Procedures (including critical care time)  Medications Ordered in ED Medications - No data to display   Initial Impression / Assessment and Plan / ED Course  I have reviewed the triage vital signs and the nursing notes.  Pertinent labs & imaging results that were available during my care of the patient were reviewed by me and considered in my medical decision making (see chart for details).      Patient presenting today with onset of left-sided chest pain and shortness of breath that started around 8 AM this morning. Significant history for lung cancer and recurrent left-sided pleural effusions. On Monday oncologist had decreased breath sounds are heard a chest x-ray but patient has not gotten around to getting the x-ray. He was asymptomatic until this morning. He denies any swelling and has no physical exam findings for fluid overload. He  denies any unilateral leg pain or swelling over the last few days concerning for potential PE. EKG without evidence of acute changes and low suspicion that this is ACS. Troponins within normal limits, CBC without evidence of neutropenia, CMP without acute issues.  CXR pending.  O2 sats on RA 99%  11:55 AM Reaccumulation of pleural effusion. Discussed with hospitalist for admission and drain placement.  Final Clinical Impressions(s) / ED Diagnoses   Final diagnoses:  Recurrent left pleural effusion    New Prescriptions New Prescriptions   No medications on file     Blanchie Dessert, MD 03/02/17 1156

## 2017-03-02 NOTE — ED Triage Notes (Addendum)
Pt reports he began to have SOB and L side CP since this am.  Pt had chemo on Monday. Hx of lung ca. Has had to have fluid removed from his lung before, symptoms today feel like same.

## 2017-03-03 ENCOUNTER — Observation Stay (HOSPITAL_BASED_OUTPATIENT_CLINIC_OR_DEPARTMENT_OTHER): Payer: Medicare Other

## 2017-03-03 DIAGNOSIS — C3492 Malignant neoplasm of unspecified part of left bronchus or lung: Secondary | ICD-10-CM | POA: Diagnosis not present

## 2017-03-03 DIAGNOSIS — J90 Pleural effusion, not elsewhere classified: Secondary | ICD-10-CM

## 2017-03-03 DIAGNOSIS — J910 Malignant pleural effusion: Secondary | ICD-10-CM

## 2017-03-03 DIAGNOSIS — R0602 Shortness of breath: Secondary | ICD-10-CM | POA: Diagnosis not present

## 2017-03-03 DIAGNOSIS — R0600 Dyspnea, unspecified: Secondary | ICD-10-CM

## 2017-03-03 LAB — CBC
HEMATOCRIT: 31.5 % — AB (ref 39.0–52.0)
HEMOGLOBIN: 10.4 g/dL — AB (ref 13.0–17.0)
MCH: 32 pg (ref 26.0–34.0)
MCHC: 33 g/dL (ref 30.0–36.0)
MCV: 96.9 fL (ref 78.0–100.0)
Platelets: 199 10*3/uL (ref 150–400)
RBC: 3.25 MIL/uL — ABNORMAL LOW (ref 4.22–5.81)
RDW: 18.5 % — AB (ref 11.5–15.5)
WBC: 5 10*3/uL (ref 4.0–10.5)

## 2017-03-03 LAB — COMPREHENSIVE METABOLIC PANEL
ALK PHOS: 86 U/L (ref 38–126)
ALT: 12 U/L — ABNORMAL LOW (ref 17–63)
ANION GAP: 7 (ref 5–15)
AST: 22 U/L (ref 15–41)
Albumin: 3.3 g/dL — ABNORMAL LOW (ref 3.5–5.0)
BILIRUBIN TOTAL: 0.9 mg/dL (ref 0.3–1.2)
BUN: 18 mg/dL (ref 6–20)
CALCIUM: 9.7 mg/dL (ref 8.9–10.3)
CO2: 25 mmol/L (ref 22–32)
Chloride: 98 mmol/L — ABNORMAL LOW (ref 101–111)
Creatinine, Ser: 0.66 mg/dL (ref 0.61–1.24)
GFR calc Af Amer: >60 mL/min (ref >60)
GLUCOSE: 116 mg/dL — AB (ref 65–99)
POTASSIUM: 4.8 mmol/L (ref 3.5–5.1)
Sodium: 130 mmol/L — ABNORMAL LOW (ref 135–145)
TOTAL PROTEIN: 8.5 g/dL — AB (ref 6.5–8.1)

## 2017-03-03 LAB — ECHOCARDIOGRAM COMPLETE
HEIGHTINCHES: 69 in
WEIGHTICAEL: 2256 [oz_av]

## 2017-03-03 NOTE — Progress Notes (Signed)
  Echocardiogram 2D Echocardiogram has been performed.  Joshua White M 03/03/2017, 9:00 AM

## 2017-03-03 NOTE — Discharge Instructions (Signed)
Lung Cancer Lung cancer occurs when abnormal cells in the lung grow out of control and form a mass (tumor). There are several types of lung cancer. The two most common types are:  Non-small cell. In this type of lung cancer, abnormal cells are larger and grow more slowly than those of small cell lung cancer.  Small cell. In this type of lung cancer, abnormal cells are smaller than those of non-small cell lung cancer. Small cell lung cancer gets worse faster than non-small cell lung cancer.  What are the causes? The leading cause of lung cancer is smoking tobacco. The second leading cause is radon exposure. What increases the risk?  Smoking tobacco.  Exposure to secondhand tobacco smoke.  Exposure to radon gas.  Exposure to asbestos.  Exposure to arsenic in drinking water.  Air pollution.  Family or personal history of lung cancer.  Lung radiation therapy.  Being older than 65 years. What are the signs or symptoms? In the early stages, symptoms may not be present. As the cancer progresses, symptoms may include:  A lasting cough, possibly with blood.  Fatigue.  Unexplained weight loss.  Shortness of breath.  Wheezing.  Chest pain.  Loss of appetite.  Symptoms of advanced lung cancer include:  Hoarseness.  Bone or joint pain.  Weakness.  Nail problems.  Face or arm swelling.  Paralysis of the face.  Drooping eyelids.  How is this diagnosed? Lung cancer can be identified with a physical exam and with tests such as:  A chest X-ray.  A CT scan.  Blood tests.  A biopsy.  After a diagnosis is made, you will have more tests to determine the stage of the cancer. The stages of non-small cell lung cancer are:  Stage 0, also called carcinoma in situ. At this stage, abnormal cells are found in the inner lining of your lung or lungs.  Stage I. At this stage, abnormal cells have grown into a tumor that is no larger than 5 cm across. The cancer has entered  the deeper lung tissue but has not yet entered the lymph nodes or other parts of the body.  Stage II. At this stage, the tumor is 7 cm across or smaller and has entered nearby lymph nodes. Or, the tumor is 5 cm across or smaller and has invaded surrounding tissue but is not found in nearby lymph nodes. There may be more than one tumor present.  Stage III. At this stage, the tumor may be any size. There may be more than one tumor in the lungs. The cancer cells have spread to the lymph nodes and possibly to other organs.  Stage IV. At this stage, there are tumors in both lungs and the cancer has spread to other areas of the body.  The stages of small cell lung cancer are:  Limited. At this stage, the cancer is found only on one side of the chest.  Extensive. At this stage, the cancer is in the lungs and in tissues on the other side of the chest. The cancer has spread to other organs or is found in the fluid between the layers of your lungs.  How is this treated? Depending on the type and stage of your lung cancer, you may be treated with:  Surgery. This is done to remove a tumor.  Radiation therapy. This treatment destroys cancer cells using X-rays or other types of radiation.  Chemotherapy. This treatment uses medicines to destroy cancer cells.  Targeted therapy. This treatment   instead of all cells as other therapies do. You may also have a combination of treatments. Follow these instructions at home:  Do not use any tobacco products. This includes cigarettes, chewing tobacco, and electronic cigarettes. If you need help quitting, ask your health care provider.  Take medicines only as directed by your health care provider.  Eat a healthy diet. Work with a dietitian to make sure you are getting the nutrition you need.  Consider joining a support group or seeking counseling to help you cope with the stress of having lung cancer.  Let your cancer specialist  (oncologist) know if you are admitted to the hospital.  Keep all follow-up visits as directed by your health care provider. This is important. Contact a health care provider if:  You lose weight without trying.  You have a persistent cough and wheezing.  You feel short of breath.  You tire easily.  You experience bone or joint pain.  You have difficulty swallowing.  You feel hoarse or notice your voice changing.  Your pain medicine is not helping. Get help right away if:  You cough up blood.  You have new breathing problems.  You develop chest pain.  You develop swelling in:  One or both ankles or legs.  Your face, neck, or arms.  You are confused.  You experience paralysis in your face or a drooping eyelid. This information is not intended to replace advice given to you by your health care provider. Make sure you discuss any questions you have with your health care provider. Document Released: 01/22/2001 Document Revised: 03/23/2016 Document Reviewed: 02/19/2014 Elsevier Interactive Patient Education  2017 [place] of Breath, Adult Shortness of breath means you have trouble breathing. Your lungs are organs for breathing. Follow these instructions at home: Pay attention to any changes in your symptoms. Take these actions to help with your condition:  Do not smoke. Smoking can cause shortness of breath. If you need help to quit smoking, ask your doctor.  Avoid things that can make it harder to breathe, such as:  Mold.  Dust.  Air pollution.  Chemical smells.  Things that can cause allergy symptoms (allergens), if you have allergies.  Keep your living space clean and free of mold and dust.  Rest as needed. Slowly return to your usual activities.  Take over-the-counter and prescription medicines, including oxygen and inhaled medicines, only as told by your doctor.  Keep all follow-up visits as told by your doctor. This is  important. Contact a doctor if:  Your condition does not get better as soon as expected.  You have a hard time doing your normal activities, even after you rest.  You have new symptoms. Get help right away if:  You have trouble breathing when you are resting.  You feel light-headed or you faint.  You have a cough that is not helped by medicines.  You cough up blood.  You have pain with breathing.  You have pain in your chest, arms, shoulders, or belly (abdomen).  You have a fever.  You cannot walk up stairs.  You cannot exercise the way you normally do. This information is not intended to replace advice given to you by your health care provider. Make sure you discuss any questions you have with your health care provider. Document Released: 04/03/2008 Document Revised: 11/02/2016 Document Reviewed: 11/02/2016 Elsevier Interactive Patient Education  2017 [employer].

## 2017-03-03 NOTE — Discharge Summary (Signed)
Physician Discharge Summary  Joshua White TKZ:[id number] DOB: Dec 17, 1950 DOA: 03/02/2017  PCP: System, Provider Not In  Admit date: 03/02/2017 Discharge date: 03/03/2017  Time spent: 45 minutes  Recommendations for Outpatient Follow-up:  Patient will be discharged to home.  Patient will need to follow up with primary care provider within one week of discharge. Follow up with cardiology.  Patient should continue medications as prescribed.  Patient should follow a regular diet.   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 Marzetta Board Joshua White is a 66 y.o. male with medical history significant of metastatic lung cancer currently undergoing chemotherapy, his oncologist is Dr. Irene Limbo, presents to the emergency room with a chief complaint of shortness of breath.  Patient has been having a recurrent malignant pleural effusion requiring intermittent thoracentesis at one point had a Pleurx catheter placed.  He was seen by Dr. Pia Mau with thoracic surgery couple months ago and he went talc pleurodesis with Pleurx drain removal.  Patient was subsequently started on chemotherapy.  He comes in today stating that over the last week to a few days he has been having progressive shortness of breath, he feels okay if he is at rest however when he starts ambulating he gets pretty dyspneic.  He also has been complaining of left-sided chest pain worse with inspiration where he had the surgery done.  He denies any fever or chills, he denies any cough or chest congestion.  He denies any substernal chest pain.  He has no abdominal pain, no nausea or vomiting.  He states that he tolerates  chemotherapy well without too much side effects.   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 Everrett Coombe nurse, and he was able to review patient's films and felt they looked improved as compared to previous. Did not recommended additional surgical intervention at this time.  -Chest x-ray showed slightly enlarged left pleural effusion -[place] chest: Ultrasound test of the left chest performed showing very small amount of pleural fluid not amenable to thoracentesis  -Feel patient's dyspnea is likely multifactorial including pleural effusion versus underlying cancer -Echocardiogram showed an EF of 57-32%, grade 1 diastolic dysfunction. Study was compared to 2014, EF is higher with moderate LVH, diastolic dysfunction is new -Will refer patient to cardiology for outpatient visit  Metastatic lung cancer -Continue care/chemo with Dr. Irene Limbo, oncologist   New Grade 1 Diastolic dysfunction -patient does not overtly appear to be hypervolemic -Pleural effusion was not large enough for thoracentesis- feel that it is likely due to lung cancer vs CHF -Refer to cardiology as an outpatient   Normocytic Anemia -Likely due to cancer and chemotherapy -Baseline hemoglobin 10, currently 10.4  Hyponatremia/hypochloremia  -appears to be chronic after reviewing labs in EPIC -Continue to monitor BMP as an outpatient   Procedures: Echocardiogram Chest [place]  Consultations: None  Discharge Exam: Vitals:   03/02/17 2039 03/03/17 0530  BP: 139/81 132/85  Pulse: 79 77  Resp: 16 18  Temp: 98 F (36.7 C) 98 F (36.7 C)     General: Well developed, thin, NAD  HEENT: NCAT,mucous membranes moist.  Cardiovascular: S1 S2 auscultated, RRR, no murmurs  Respiratory: Diminished  breath sounds left lowe base, otherwise clear  Abdomen: Soft, nontender, nondistended, + bowel sounds  Extremities: warm dry without  cyanosis clubbing or edema  Neuro: AAOx3, nonfocal  Psych: Normal affect and demeanor with intact judgement and insight  Discharge Instructions Discharge Instructions    Discharge instructions    Complete by:  As directed    Patient will be discharged to home.  Patient will need to follow up with primary care provider within one week of discharge. Follow up with Dr. Irene Limbo, oncologist.  Follow up with cardiology.  Patient should continue medications as prescribed.  Patient should follow a regular diet.     Current Discharge Medication List    CONTINUE these medications which have NOT CHANGED   Details  aspirin EC 81 MG tablet Take 162 mg by mouth daily.    dexamethasone (DECADRON) 4 MG tablet Take 1 tablet (4 mg total) by mouth 2 (two) times daily. Qty: 10 tablet, Refills: 0    dronabinol (MARINOL) 5 MG capsule Take 1 capsule (5 mg total) by mouth 2 (two) times daily before a meal. Qty: 60 capsule, Refills: 0    folic acid (FOLVITE) 1 MG tablet Take 1 tablet (1 mg total) by mouth daily. Start 5-7 days before Alimta chemotherapy. Continue until 21 days after Alimta completed. Qty: 100 tablet, Refills: 3   Associated Diagnoses: Adenocarcinoma of left lung, stage 4 (HCC)    morphine (MS CONTIN) 30 MG 12 hr tablet Take 1 tablet (30 mg total) by mouth every 8 (eight) hours. Qty: 90 tablet, Refills: 0    ondansetron (ZOFRAN) 8 MG tablet Take 1 tablet (8 mg total) by mouth 2 (two) times daily as needed for refractory nausea / vomiting. Start on day 3 after chemo. Qty: 30 tablet, Refills: 1   Associated Diagnoses: Adenocarcinoma of left lung, stage 4 (HCC)    oxyCODONE (OXY IR/ROXICODONE) 5 MG immediate release tablet Take 1-2 tablets (5-10 mg total) by mouth every 4 (four) hours as needed for severe pain. Qty: 60 tablet, Refills: 0    polyethylene glycol (MIRALAX) packet Take 17 g by mouth daily. Qty: 30 each, Refills: 1    prochlorperazine (COMPAZINE) 10 MG tablet Take 1 tablet (10  mg total) by mouth every 6 (six) hours as needed (Nausea or vomiting). Qty: 30 tablet, Refills: 1   Associated Diagnoses: Adenocarcinoma of left lung, stage 4 (HCC)    senna-docusate (SENNA S) 8.6-50 MG tablet Take 2 tablets by mouth 2 (two) times daily. Qty: 60 tablet, Refills: 2       Allergies  Allergen Reactions  . Tuberculin Purified Protein Derivative Swelling and Other (See Comments)    Reaction:  Unspecified swelling reaction   . Sulfa Antibiotics Other (See Comments)    Reaction:  Causes pt to pass out    Follow-up Information    Brunetta Genera, MD. Schedule an appointment as soon as possible for a visit in 1 week(s).   Specialties:  Hematology, Oncology Why:  Hospital follow up Contact information: [place] [address] [phone]        [place]. Schedule an appointment as soon as possible for a visit in 1 week(s).   Why:  Hospital follow up Contact information: [place] [address] [phone]           The results of significant diagnostics from this hospitalization (including imaging, microbiology, ancillary and laboratory) are listed below for  reference.    Significant Diagnostic Studies: Dg Chest 2 View  Result Date: 03/02/2017 CLINICAL DATA:  Shortness of breath.  Left pleural effusion. EXAM: CHEST  2 VIEW COMPARISON:  01/25/2017 FINDINGS: Cardiomediastinal silhouette is normal. Mediastinal contours appear intact. There is no evidence of pneumothorax. There is slightly enlarged left pleural effusion. The known left perihilar mass has stable radiographic appearance. Left lower lobe atelectasis versus airspace consolidation. The right basilar nodular opacity seen by CT in February 2018 is not appreciated radiographically. Osseous structures are without acute abnormality. Soft tissues are grossly normal. IMPRESSION: Slightly enlarged  left pleural effusion, compared to patient's last radiograph dated 01/25/2017. 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 Electronically Signed   By: Fidela Salisbury M.D.   On: 03/02/2017 11:32   [place] Chest  Result Date: 03/02/2017 CLINICAL DATA:  Patient with lung cancer and history of pleural effusion. EXAM: CHEST ULTRASOUND COMPARISON:  None. FINDINGS: Limited US of the left chest. Small amount of pleural fluid not amenable to thoracentesis. IMPRESSION: Limited US of the left chest performed which shows very small amount of pleural fluid which is not amenable to thoracentesis. Read by:  Brynda Greathouse PA-C Electronically Signed   By: Jerilynn Mages.  Shick M.D.   On: 03/02/2017 16:20    Microbiology: No results found for this or any previous visit (from the past 240 hour(s)).   Labs: Basic Metabolic Panel:  Recent Labs Lab 02/26/17 1319 03/02/17 0958 03/03/17 0437  NA 133* 133* 130*  K 4.9 4.2 4.8  CL  --  101 98*  CO2 '22 24 25  '$ GLUCOSE 132 87 116*  BUN 13.5 22* 18  CREATININE 0.9 0.77 0.66  CALCIUM 10.5* 9.4 9.7   Liver Function Tests:  Recent Labs Lab 02/26/17 1319 03/03/17 0437  AST 18 22  ALT 9 12*  ALKPHOS 105 86  BILITOT 0.52 0.9  PROT 9.4* 8.5*  ALBUMIN 3.2* 3.3*   No results for input(s): LIPASE, AMYLASE in the last 168 hours. No results for input(s): AMMONIA in the last 168 hours. CBC:  Recent Labs Lab 02/26/17 1319 03/02/17 0958 03/03/17 0437  WBC 3.9* 8.0 5.0  NEUTROABS 3.4  --   --   HGB 11.4* 10.2* 10.4*  HCT 34.5* 30.7* 31.5*  MCV 97.5 98.1 96.9  PLT 170 206 199   Cardiac Enzymes: No results for input(s): CKTOTAL, CKMB, CKMBINDEX, TROPONINI in the last 168 hours. BNP: BNP (last 3 results) No results for input(s): BNP in the last 8760 hours.  ProBNP (last 3 results) No results for input(s): PROBNP in the last 8760 hours.  CBG: No results for input(s): GLUCAP in the last 168 hours.     SignedCristal Ford  [facility] 03/03/2017, 1:40 PM

## 2017-03-04 NOTE — Progress Notes (Signed)
Patient discharged to home with family, discharge instructions reviewed with patient who verbalized understanding. No new RX's.

## 2017-03-12 ENCOUNTER — Telehealth: Payer: Self-pay | Admitting: *Deleted

## 2017-03-12 ENCOUNTER — Other Ambulatory Visit: Payer: Self-pay | Admitting: *Deleted

## 2017-03-12 MED ORDER — OXYCODONE HCL 5 MG PO TABS: 5.0000 mg | ORAL_TABLET | ORAL | 0 refills | Status: DC | PRN

## 2017-03-12 NOTE — Telephone Encounter (Signed)
Call from family member: Pt needs refill of Oxycodone. Message to Dr. Grier Mitts RN for review/script.

## 2017-03-12 NOTE — Progress Notes (Signed)
Cardiology Office Note    Date:  03/14/2017   ID:  Joshua White, DOB 28-Apr-1951, MRN [id number]  PCP:  System, Provider Not In  Cardiologist:  New to Dr. Angelena Form  Chief Complaint: Evaluation of diastolic dysfunction   History of Present Illness:   Joshua White is a 67 y.o. male metastatic lung adenocarcinoma  (current undergoing chemo with Dr. Irene Limbo), recurrent malignant pleural effusion (one time treated with Pleurx drain by Dr. Servando Snare), hep C, G6PD deficiency, seizure, chronic RBBB presents for evaluation of new diagnosis of diastolic dysfunction by Dr. Ree Kida.   He was admitted to [facility] 5/4-5/5 for shortness of breath. Chest x-ray showed slightly enlarged left pleural effusion. However [place] chest showing very small amount of pleural fluid not amenable to thoracentesis. Echo showed normal LV function with grade 1 diastolic dysfunction and moderate LVH. His dyspnea felt multifactorial due to pleural Effusion versus underlying malignancy.  Here for follow up. Patient has chronic dyspnea on exertion that has been getting worse. No shortness of breath with Rest. He denies orthopnea, PND, syncope, dizziness, lower extremity edema, melena or blood in his stool or urine. He does complain of palpitation with exertion. Chronic stable left-sided chest soreness due to prior chest tube. He eats lots of salt.   Previously seen by Dr. Terrence Dupont in 2012. Stress Myoview was normal. No regular follow-up since then.Father had MI in his 48s.   Past Medical History:  Diagnosis Date  . G6PD deficiency ([place])   . H/O orthostatic hypotension   . Lung cancer ([place]) dx'd 01/2017  . Pneumonia   . RBBB (right bundle branch block)   . Seizures ([place])     Past Surgical History:  Procedure Laterality Date  . APPENDECTOMY    . CHEST TUBE INSERTION Left 12/20/2016   Procedure: INSERTION PLEURAL DRAINAGE CATHETER;  Surgeon: Grace Isaac, MD;  Location: [place];  Service: Thoracic;  Laterality: Left;  .  PLEURAL BIOPSY Left 12/20/2016   Procedure: PLEURAL BIOPSY;  Surgeon: Grace Isaac, MD;  Location: [place];  Service: Thoracic;  Laterality: Left;  . US ECHOCARDIOGRAPHY  05/12/2008   EF 55-60%  . VIDEO ASSISTED THORACOSCOPY (VATS) W/TALC PLEUADESIS Left 12/20/2016   Procedure: VIDEO ASSISTED THORACOSCOPY (VATS) W/TALC PLEURADESIS;  Surgeon: Grace Isaac, MD;  Location: [place];  Service: Thoracic;  Laterality: Left;  [name] VIDEO BRONCHOSCOPY N/A 12/20/2016   Procedure: VIDEO BRONCHOSCOPY;  Surgeon: Grace Isaac, MD;  Location: [facility] OR;  Service: Thoracic;  Laterality: N/A;    Current Medications: Prior to Admission medications   Medication Sig Start Date End Date Taking? Authorizing Provider  aspirin EC 81 MG tablet Take 162 mg by mouth daily.    [provider]  dexamethasone (DECADRON) 4 MG tablet Take 1 tablet (4 mg total) by mouth 2 (two) times daily. 01/25/17   Brunetta Genera, MD  dronabinol (MARINOL) 5 MG capsule Take 1 capsule (5 mg total) by mouth 2 (two) times daily before a meal. 02/06/17   Brunetta Genera, MD  folic acid (FOLVITE) 1 MG tablet Take 1 tablet (1 mg total) by mouth daily. Start 5-7 days before Alimta chemotherapy. Continue until 21 days after Alimta completed. 01/16/17   Brunetta Genera, MD  morphine (MS CONTIN) 30 MG 12 hr tablet Take 1 tablet (30 mg total) by mouth every 8 (eight) hours. 02/06/17   Brunetta Genera, MD  ondansetron (ZOFRAN) 8 MG tablet Take 1 tablet (8 mg total) by mouth  2 (two) times daily as needed for refractory nausea / vomiting. Start on day 3 after chemo. 02/26/17   Brunetta Genera, MD  oxyCODONE (OXY IR/ROXICODONE) 5 MG immediate release tablet Take 1-2 tablets (5-10 mg total) by mouth every 4 (four) hours as needed for severe pain. 03/12/17   Brunetta Genera, MD  polyethylene glycol [facility]) packet Take 17 g by mouth daily. 01/09/17   Brunetta Genera, MD  prochlorperazine (COMPAZINE) 10 MG tablet Take 1  tablet (10 mg total) by mouth every 6 (six) hours as needed (Nausea or vomiting). 01/16/17   Brunetta Genera, MD  senna-docusate (SENNA S) 8.6-50 MG tablet Take 2 tablets by mouth 2 (two) times daily. 01/09/17   Brunetta Genera, MD    Allergies:   Tuberculin purified protein derivative and Sulfa antibiotics   Social History   Social History  . Marital status: Single    Spouse name: N/A  . Number of children: N/A  . Years of education: N/A   Social History Main Topics  . Smoking status: Former Smoker    Packs/day: 2.00    Years: 30.00    Types: Cigars  . Smokeless tobacco: Never Used     Comment: Quit ~2017  . Alcohol use No     Comment: Former use  . Drug use: No  . Sexual activity: Not Asked   Other Topics Concern  . None   Social History Narrative  . None     Family History:  The patient's family history includes Brain cancer in his mother; Heart attack in his father; Heart disease in his father; Hypertension in his father; Lung cancer in his mother.   ROS:   Please see the history of present illness.    ROS All other systems reviewed and are negative.   PHYSICAL EXAM:   VS:  BP 118/76   Pulse 94   Ht '5\' 9"'$  (1.753 m)   Wt 137 lb (62.1 kg)   SpO2 96%   BMI 20.23 kg/m    GEN: Well nourished, well developed, in no acute distress  HEENT: normal  Neck: no JVD, carotid bruits, or masses Cardiac: regular rhythm with occasional extra beats; no murmurs, rubs, or gallops, trace edema  Respiratory:  clear to auscultation bilaterally, normal work of breathing GI: soft, nontender, nondistended, + BS MS: no deformity or atrophy  Skin: warm and dry, no rash Neuro:  Alert and Oriented x 3, Strength and sensation are intact Psych: euthymic mood, full affect  Wt Readings from Last 3 Encounters:  03/14/17 137 lb (62.1 kg)  03/02/17 141 lb (64 kg)  02/26/17 141 lb 1 oz (64 kg)      Studies/Labs Reviewed:   EKG:  EKG is ordered today.  The ekg ordered today  demonstrates NSR at rate of 89 bpm with PACS  Recent Labs: 03/03/2017: ALT 12; BUN 18; Creatinine, Ser 0.66; Hemoglobin 10.4; Platelets 199; Potassium 4.8; Sodium 130   Lipid Panel    Component Value Date/Time   CHOL  02/23/2011 1704    130        ATP III CLASSIFICATION:  <200     mg/dL   Desirable  200-239  mg/dL   Borderline High  >=240    mg/dL   High          TRIG 123 02/23/2011 1704   HDL 26 (L) 02/23/2011 1704   CHOLHDL 5.0 02/23/2011 1704   VLDL 25 02/23/2011 1704   LDLCALC  02/23/2011 1704    79        Total Cholesterol/HDL:CHD Risk Coronary Heart Disease Risk Table                     Men   Women  1/2 Average Risk   3.4   3.3  Average Risk       5.0   4.4  2 X Average Risk   9.6   7.1  3 X Average Risk  23.4   11.0        Use the calculated Patient Ratio above and the CHD Risk Table to determine the patient's CHD Risk.        ATP III CLASSIFICATION (LDL):  <100     mg/dL   Optimal  100-129  mg/dL   Near or Above                    Optimal  130-159  mg/dL   Borderline  160-189  mg/dL   High  >190     mg/dL   Very High    Additional studies/ records that were reviewed today include:   Echocardiogram: 03/03/17 Study Conclusions  - Left ventricle: The cavity size was normal. Wall thickness was   increased in a pattern of moderate LVH. Systolic function was   normal. The estimated ejection fraction was in the range of 60%   to 65%. Wall motion was normal; there were no regional wall   motion abnormalities. Doppler parameters are consistent with   abnormal left ventricular relaxation (grade 1 diastolic   dysfunction). The E/e&' ratio is between 8-15, suggesting   indeterminate LV filling pressure. - Aortic valve: Trileaflet. Sclerosis without stenosis. There was   no regurgitation. - Left atrium: The atrium was normal in size. - Inferior vena cava: The vessel was normal in size. The   respirophasic diameter changes were in the normal range (= 50%),    consistent with normal central venous pressure.  Impressions:  - Compared to a prior study in 2014, the LVEF is higher at 60-65%,   with moderate LVH and diastolic dysfunction that is new.     ASSESSMENT & PLAN:   1. Dyspnea on exertion - Seems multifactorial from underlying lung disease and cancer. He does have chronic chest soreness on his left side from prior chest tube. Prior Myoview in 2012 was normal. His cardiac risk factor includes prior tobacco smoking, strong family history of CAD, coronary atherosclerosis on CT of chest. We will do Lexiscan to rule out any ischemia. He cannot walk on a treadmill due to shortness of breath. Continue aspirin.  2. Chronic diastolic CHF - Echo with normal EF and grade 1 diastolic dysfunction. He denies any orthopnea, PND or syncope. Mild edema on his legs. Advised to cut back on her salt intake. No need of daily diuretics.  3. Palpitation - Occurs with exertion. This pain going on for the past 3 weeks. EKG today shows PACs. We will do 48 hour monitor to rule out any arrhythmia. Will consider beta blocker pending result.  4. Aortic aneurysm - noted Mild ascending aortic aneurysm at 4.0 cm on CT of chest. Recommended yearly follow up.   Discussed with DOD Dr. Angelena Form. Follow up pending result.    Medication Adjustments/Labs and Tests Ordered: Current medicines are reviewed at length with the patient today.  Concerns regarding medicines are outlined above.  Medication changes, Labs and Tests ordered today are listed in the  Patient Instructions below. Patient Instructions  Medication Instructions:  Your physician recommends that you continue on your current medications as directed. Please refer to the Current Medication list given to you today.   Labwork: None ordered  Testing/Procedures: Your physician has recommended that you wear a holter monitor. Holter monitors are medical devices that record the heart's electrical activity. Doctors  most often use these monitors to diagnose arrhythmias. Arrhythmias are problems with the speed or rhythm of the heartbeat. The monitor is a small, portable device. You can wear one while you do your normal daily activities. This is usually used to diagnose what is causing palpitations/syncope (passing out).   Your physician has requested that you have a lexiscan myoview. For further information please visit [email or web address]. Please follow instruction sheet, as given.   Follow-Up: Your physician recommends that you schedule a follow-up appointment in: DEPENDING UPON TEST RESUTLS   Any Other Special Instructions Will Be Listed Below (If Applicable).   Holter Monitoring A Holter monitor is a small device that is used to detect abnormal heart rhythms. It clips to your clothing and is connected by wires to flat, sticky disks (electrodes) that attach to your chest. It is worn continuously for 24-48 hours. Follow these instructions at home:  Wear your Holter monitor at all times, even while exercising and sleeping, for as long as directed by your health care provider.  Make sure that the Holter monitor is safely clipped to your clothing or close to your body as recommended by your health care provider.  Do not get the monitor or wires wet.  Do not put body lotion or moisturizer on your chest.  Keep your skin clean.  Keep a diary of your daily activities, such as walking and doing chores. If you feel that your heartbeat is abnormal or that your heart is fluttering or skipping a beat:  Record what you are doing when it happens.  Record what time of day the symptoms occur.  Return your Holter monitor as directed by your health care provider.  Keep all follow-up visits as directed by your health care provider. This is important. Get help right away if:  You feel lightheaded or you faint.  You have trouble breathing.  You feel pain in your chest, upper arm, or jaw.  You feel sick  to your stomach and your skin is pale, cool, or damp.  You heartbeat feels unusual or abnormal. This information is not intended to replace advice given to you by your health care provider. Make sure you discuss any questions you have with your health care provider. Document Released: 07/14/2004 Document Revised: 03/23/2016 Document Reviewed: 05/25/2014 Elsevier Interactive Patient Education  2017 [place].    Pharmacologic Stress Electrocardiogram Introduction A pharmacologic stress electrocardiogram is a heart (cardiac) test that uses nuclear imaging to evaluate the blood supply to your heart. This test may also be called a pharmacologic stress electrocardiography. Pharmacologic means that a medicine is used to increase your heart rate and blood pressure. This stress test is done to find areas of poor blood flow to the heart by determining the extent of coronary artery disease (CAD). Some people exercise on a treadmill, which naturally increases the blood flow to the heart. For those people unable to exercise on a treadmill, a medicine is used. This medicine stimulates your heart and will cause your heart to beat harder and more quickly, as if you were exercising. Pharmacologic stress tests can help determine:  The adequacy of blood flow  to your heart during increased levels of activity in order to clear you for discharge home.  The extent of coronary artery blockage caused by CAD.  Your prognosis if you have suffered a heart attack.  The effectiveness of cardiac procedures done, such as an angioplasty, which can increase the circulation in your coronary arteries.  Causes of chest pain or pressure. LET [facility] CARE PROVIDER KNOW ABOUT:  Any allergies you have.  All medicines you are taking, including vitamins, herbs, eye drops, creams, and over-the-counter medicines.  Previous problems you or members of your family have had with the use of anesthetics.  Any blood disorders  you have.  Previous surgeries you have had.  Medical conditions you have.  Possibility of pregnancy, if this applies.  If you are currently breastfeeding. RISKS AND COMPLICATIONS Generally, this is a safe procedure. However, as with any procedure, complications can occur. Possible complications include:  You develop pain or pressure in the following areas:  Chest.  Jaw or neck.  Between your shoulder blades.  Radiating down your left arm.  Headache.  Dizziness or light-headedness.  Shortness of breath.  Increased or irregular heartbeat.  Low blood pressure.  Nausea or vomiting.  Flushing.  Redness going up the arm and slight pain during injection of medicine.  Heart attack (rare). BEFORE THE PROCEDURE  Avoid all forms of caffeine for 24 hours before your test or as directed by your health care provider. This includes coffee, tea (even decaffeinated tea), caffeinated sodas, chocolate, cocoa, and certain pain medicines.  Follow your health care provider's instructions regarding eating and drinking before the test.  Take your medicines as directed at regular times with water unless instructed otherwise. Exceptions may include:  If you have diabetes, ask how you are to take your insulin or pills. It is common to adjust insulin dosing the morning of the test.  If you are taking beta-blocker medicines, it is important to talk to your health care provider about these medicines well before the date of your test. Taking beta-blocker medicines may interfere with the test. In some cases, these medicines need to be changed or stopped 24 hours or more before the test.  If you wear a nitroglycerin patch, it may need to be removed prior to the test. Ask your health care provider if the patch should be removed before the test.  If you use an inhaler for any breathing condition, bring it with you to the test.  If you are an outpatient, bring a snack so you can eat right after  the stress phase of the test.  Do not smoke for 4 hours prior to the test or as directed by your health care provider.  Do not apply lotions, powders, creams, or oils on your chest prior to the test.  Wear comfortable shoes and clothing. Let your health care provider know if you were unable to complete or follow the preparations for your test. PROCEDURE  Multiple patches (electrodes) will be put on your chest. If needed, small areas of your chest may be shaved to get better contact with the electrodes. Once the electrodes are attached to your body, multiple wires will be attached to the electrodes, and your heart rate will be monitored.  An IV access will be started. A nuclear trace (isotope) is given. The isotope may be given intravenously, or it may be swallowed. Nuclear refers to several types of radioactive isotopes, and the nuclear isotope lights up the arteries so that the nuclear  images are clear. The isotope is absorbed by your body. This results in low radiation exposure.  A resting nuclear image is taken to show how your heart functions at rest.  A medicine is given through the IV access.  A second scan is done about 1 hour after the medicine injection and determines how your heart functions under stress.  During this stress phase, you will be connected to an electrocardiogram machine. Your blood pressure and oxygen levels will be monitored. What to expect after the procedure  Your heart rate and blood pressure will be monitored after the test.  You may return to your normal schedule, including diet,activities, and medicines, unless your health care provider tells you otherwise. This information is not intended to replace advice given to you by your health care provider. Make sure you discuss any questions you have with your health care provider. Document Released: 03/04/2009 Document Revised: 03/23/2016 Document Reviewed: 04/24/2016 Elsevier Interactive Patient Education   2017 [employer].   If you need a refill on your cardiac medications before your next appointment, please call your pharmacy.   DASH Eating Plan DASH stands for "Dietary Approaches to Stop Hypertension." The DASH eating plan is a healthy eating plan that has been shown to reduce high blood pressure (hypertension). It may also reduce your risk for type 2 diabetes, heart disease, and stroke. The DASH eating plan may also help with weight loss. What are tips for following this plan? General guidelines   Avoid eating more than 2,000 mg (milligrams) of salt (sodium) a day. If you have hypertension, you may need to reduce your sodium intake to 1,500 mg a day.  Limit alcohol intake to no more than 1 drink a day for nonpregnant women and 2 drinks a day for men. One drink equals 12 oz of beer, 5 oz of wine, or 1 oz of hard liquor.  Work with your health care provider to maintain a healthy body weight or to lose weight. Ask what an ideal weight is for you.  Get at least 30 minutes of exercise that causes your heart to beat faster (aerobic exercise) most days of the week. Activities may include walking, swimming, or biking.  Work with your health care provider or diet and nutrition specialist (dietitian) to adjust your eating plan to your individual calorie needs. Reading food labels   Check food labels for the amount of sodium per serving. Choose foods with less than 5 percent of the Daily Value of sodium. Generally, foods with less than 300 mg of sodium per serving fit into this eating plan.  To find whole grains, look for the word "whole" as the first word in the ingredient list. Shopping   Buy products labeled as "low-sodium" or "no salt added."  Buy fresh foods. Avoid canned foods and premade or frozen meals. Cooking   Avoid adding salt when cooking. Use salt-free seasonings or herbs instead of table salt or sea salt. Check with your health care provider or pharmacist before using salt  substitutes.  Do not fry foods. Cook foods using healthy methods such as baking, boiling, grilling, and broiling instead.  Cook with heart-healthy oils, such as olive, canola, soybean, or sunflower oil. Meal planning    Eat a balanced diet that includes:  5 or more servings of fruits and vegetables each day. At each meal, try to fill half of your plate with fruits and vegetables.  Up to 6-8 servings of whole grains each day.  Less than 6  oz of lean meat, poultry, or fish each day. A 3-oz serving of meat is about the same size as a deck of cards. One egg equals 1 oz.  2 servings of low-fat dairy each day.  A serving of nuts, seeds, or beans 5 times each week.  Heart-healthy fats. Healthy fats called Omega-3 fatty acids are found in foods such as flaxseeds and coldwater fish, like sardines, salmon, and mackerel.  Limit how much you eat of the following:  Canned or prepackaged foods.  Food that is high in trans fat, such as fried foods.  Food that is high in saturated fat, such as fatty meat.  Sweets, desserts, sugary drinks, and other foods with added sugar.  Full-fat dairy products.  Do not salt foods before eating.  Try to eat at least 2 vegetarian meals each week.  Eat more home-cooked food and less restaurant, buffet, and fast food.  When eating at a restaurant, ask that your food be prepared with less salt or no salt, if possible. What foods are recommended? The items listed may not be a complete list. Talk with your dietitian about what dietary choices are best for you. Grains  Whole-grain or whole-wheat bread. Whole-grain or whole-wheat pasta. Brown rice. [place]ena Morrow. Bulgur. Whole-grain and low-sodium cereals. Pita bread. Low-fat, low-sodium crackers. Whole-wheat flour tortillas. Vegetables  Fresh or frozen vegetables (raw, steamed, roasted, or grilled). Low-sodium or reduced-sodium tomato and vegetable juice. Low-sodium or reduced-sodium tomato sauce and  tomato paste. Low-sodium or reduced-sodium canned vegetables. Fruits  All fresh, dried, or frozen fruit. Canned fruit in natural juice (without added sugar). Meat and other protein foods  Skinless chicken or [place]. Ground chicken or [place]. Pork with fat trimmed off. Fish and seafood. Egg whites. Dried beans, peas, or lentils. Unsalted nuts, nut butters, and seeds. Unsalted canned beans. Lean cuts of beef with fat trimmed off. Low-sodium, lean deli meat. Dairy  Low-fat (1%) or fat-free (skim) milk. Fat-free, low-fat, or reduced-fat cheeses. Nonfat, low-sodium ricotta or cottage cheese. Low-fat or nonfat yogurt. Low-fat, low-sodium cheese. Fats and oils  Soft margarine without trans fats. Vegetable oil. Low-fat, reduced-fat, or light mayonnaise and salad dressings (reduced-sodium). Canola, safflower, olive, soybean, and sunflower oils. Avocado. Seasoning and other foods  Herbs. Spices. Seasoning mixes without salt. Unsalted popcorn and pretzels. Fat-free sweets. What foods are not recommended? The items listed may not be a complete list. Talk with your dietitian about what dietary choices are best for you. Grains  Baked goods made with fat, such as croissants, muffins, or some breads. Dry pasta or rice meal packs. Vegetables  Creamed or fried vegetables. Vegetables in a cheese sauce. Regular canned vegetables (not low-sodium or reduced-sodium). Regular canned tomato sauce and paste (not low-sodium or reduced-sodium). Regular tomato and vegetable juice (not low-sodium or reduced-sodium). Angie Fava. Olives. Fruits  Canned fruit in a light or heavy syrup. Fried fruit. Fruit in cream or butter sauce. Meat and other protein foods  Fatty cuts of meat. Ribs. Fried meat. Berniece Salines. Sausage. Bologna and other processed lunch meats. Salami. Fatback. Hotdogs. Bratwurst. Salted nuts and seeds. Canned beans with added salt. Canned or smoked fish. Whole eggs or egg yolks. Chicken or [place] with skin. Dairy  Whole  or 2% milk, cream, and half-and-half. Whole or full-fat cream cheese. Whole-fat or sweetened yogurt. Full-fat cheese. Nondairy creamers. Whipped toppings. Processed cheese and cheese spreads. Fats and oils  Butter. Stick margarine. Lard. Shortening. Ghee. Bacon fat. Tropical oils, such as coconut, palm kernel, or palm oil.  Seasoning and other foods  Salted popcorn and pretzels. Onion salt, garlic salt, seasoned salt, table salt, and sea salt. Worcestershire sauce. Tartar sauce. Barbecue sauce. Teriyaki sauce. Soy sauce, including reduced-sodium. Steak sauce. Canned and packaged gravies. Fish sauce. Oyster sauce. Cocktail sauce. Horseradish that you find on the shelf. Ketchup. Mustard. Meat flavorings and tenderizers. Bouillon cubes. Hot sauce and Tabasco sauce. Premade or packaged marinades. Premade or packaged taco seasonings. Relishes. Regular salad dressings. Where to find more information:  National Heart, Lung, and [place]: https://wilson-eaton.com/  American Heart Association: www.heart.org Summary  The DASH eating plan is a healthy eating plan that has been shown to reduce high blood pressure (hypertension). It may also reduce your risk for type 2 diabetes, heart disease, and stroke.  With the DASH eating plan, you should limit salt (sodium) intake to 2,300 mg a day. If you have hypertension, you may need to reduce your sodium intake to 1,500 mg a day.  When on the DASH eating plan, aim to eat more fresh fruits and vegetables, whole grains, lean proteins, low-fat dairy, and heart-healthy fats.  Work with your health care provider or diet and nutrition specialist (dietitian) to adjust your eating plan to your individual calorie needs. This information is not intended to replace advice given to you by your health care provider. Make sure you discuss any questions you have with your health care provider. Document Released: 10/05/2011 Document Revised: 10/09/2016 Document Reviewed:  10/09/2016 Elsevier Interactive Patient Education  2017 [place], [place], [place]  03/14/2017 11:50 AM    [facility] [place], [place], [place]  [address] Phone: [phone]; Fax: [phone]

## 2017-03-13 ENCOUNTER — Ambulatory Visit (HOSPITAL_COMMUNITY)
Admission: RE | Admit: 2017-03-13 | Discharge: 2017-03-13 | Disposition: A | Discharge status: Home / Self Care | Payer: Medicare Other | Source: Ambulatory Visit | Attending: Hematology | Admitting: Hematology

## 2017-03-13 ENCOUNTER — Encounter (HOSPITAL_COMMUNITY): Payer: Self-pay

## 2017-03-13 DIAGNOSIS — I712 Thoracic aortic aneurysm, without rupture: Secondary | ICD-10-CM | POA: Diagnosis not present

## 2017-03-13 DIAGNOSIS — I2510 Atherosclerotic heart disease of native coronary artery without angina pectoris: Secondary | ICD-10-CM | POA: Insufficient documentation

## 2017-03-13 DIAGNOSIS — I700 Atherosclerosis of aorta: Secondary | ICD-10-CM | POA: Insufficient documentation

## 2017-03-13 DIAGNOSIS — J90 Pleural effusion, not elsewhere classified: Secondary | ICD-10-CM | POA: Diagnosis not present

## 2017-03-13 DIAGNOSIS — C3492 Malignant neoplasm of unspecified part of left bronchus or lung: Secondary | ICD-10-CM | POA: Diagnosis present

## 2017-03-13 DIAGNOSIS — I77811 Abdominal aortic ectasia: Secondary | ICD-10-CM | POA: Insufficient documentation

## 2017-03-13 DIAGNOSIS — G893 Neoplasm related pain (acute) (chronic): Secondary | ICD-10-CM | POA: Diagnosis present

## 2017-03-13 DIAGNOSIS — Q453 Other congenital malformations of pancreas and pancreatic duct: Secondary | ICD-10-CM | POA: Diagnosis not present

## 2017-03-13 DIAGNOSIS — J439 Emphysema, unspecified: Secondary | ICD-10-CM | POA: Insufficient documentation

## 2017-03-13 MED ORDER — IOPAMIDOL (ISOVUE-300) INJECTION 61%
100.0000 mL | Freq: Once | INTRAVENOUS | Status: AC | PRN
  Administered 2017-03-13: 100 mL via INTRAVENOUS

## 2017-03-13 MED ORDER — IOPAMIDOL (ISOVUE-300) INJECTION 61%
INTRAVENOUS | Status: AC
Start: 2017-03-13 — End: 2017-03-13
  Filled 2017-03-13: qty 100

## 2017-03-14 ENCOUNTER — Ambulatory Visit (INDEPENDENT_AMBULATORY_CARE_PROVIDER_SITE_OTHER): Payer: Medicare Other | Admitting: Physician Assistant

## 2017-03-14 ENCOUNTER — Encounter: Payer: Self-pay | Admitting: Physician Assistant

## 2017-03-14 VITALS — BP 118/76 | HR 94 | Ht 69.0 in | Wt 137.0 lb

## 2017-03-14 DIAGNOSIS — I700 Atherosclerosis of aorta: Secondary | ICD-10-CM

## 2017-03-14 DIAGNOSIS — R002 Palpitations: Secondary | ICD-10-CM | POA: Diagnosis not present

## 2017-03-14 DIAGNOSIS — I5032 Chronic diastolic (congestive) heart failure: Secondary | ICD-10-CM | POA: Diagnosis not present

## 2017-03-14 DIAGNOSIS — R0609 Other forms of dyspnea: Secondary | ICD-10-CM

## 2017-03-14 NOTE — Patient Instructions (Addendum)
Medication Instructions:  Your physician recommends that you continue on your current medications as directed. Please refer to the Current Medication list given to you today.   Labwork: None ordered  Testing/Procedures: Your physician has recommended that you wear a holter monitor. Holter monitors are medical devices that record the heart's electrical activity. Doctors most often use these monitors to diagnose arrhythmias. Arrhythmias are problems with the speed or rhythm of the heartbeat. The monitor is a small, portable device. You can wear one while you do your normal daily activities. This is usually used to diagnose what is causing palpitations/syncope (passing out).   Your physician has requested that you have a lexiscan myoview. For further information please visit [email or web address]. Please follow instruction sheet, as given.   Follow-Up: Your physician recommends that you schedule a follow-up appointment in: DEPENDING UPON TEST RESUTLS   Any Other Special Instructions Will Be Listed Below (If Applicable).   Holter Monitoring A Holter monitor is a small device that is used to detect abnormal heart rhythms. It clips to your clothing and is connected by wires to flat, sticky disks (electrodes) that attach to your chest. It is worn continuously for 24-48 hours. Follow these instructions at home:  Wear your Holter monitor at all times, even while exercising and sleeping, for as long as directed by your health care provider.  Make sure that the Holter monitor is safely clipped to your clothing or close to your body as recommended by your health care provider.  Do not get the monitor or wires wet.  Do not put body lotion or moisturizer on your chest.  Keep your skin clean.  Keep a diary of your daily activities, such as walking and doing chores. If you feel that your heartbeat is abnormal or that your heart is fluttering or skipping a beat:  Record what you are doing when it  happens.  Record what time of day the symptoms occur.  Return your Holter monitor as directed by your health care provider.  Keep all follow-up visits as directed by your health care provider. This is important. Get help right away if:  You feel lightheaded or you faint.  You have trouble breathing.  You feel pain in your chest, upper arm, or jaw.  You feel sick to your stomach and your skin is pale, cool, or damp.  You heartbeat feels unusual or abnormal. This information is not intended to replace advice given to you by your health care provider. Make sure you discuss any questions you have with your health care provider. Document Released: 07/14/2004 Document Revised: 03/23/2016 Document Reviewed: 05/25/2014 Elsevier Interactive Patient Education  2017 [place].    Pharmacologic Stress Electrocardiogram Introduction A pharmacologic stress electrocardiogram is a heart (cardiac) test that uses nuclear imaging to evaluate the blood supply to your heart. This test may also be called a pharmacologic stress electrocardiography. Pharmacologic means that a medicine is used to increase your heart rate and blood pressure. This stress test is done to find areas of poor blood flow to the heart by determining the extent of coronary artery disease (CAD). Some people exercise on a treadmill, which naturally increases the blood flow to the heart. For those people unable to exercise on a treadmill, a medicine is used. This medicine stimulates your heart and will cause your heart to beat harder and more quickly, as if you were exercising. Pharmacologic stress tests can help determine:  The adequacy of blood flow to your heart during increased levels of  activity in order to clear you for discharge home.  The extent of coronary artery blockage caused by CAD.  Your prognosis if you have suffered a heart attack.  The effectiveness of cardiac procedures done, such as an angioplasty, which can  increase the circulation in your coronary arteries.  Causes of chest pain or pressure. LET [facility] CARE PROVIDER KNOW ABOUT:  Any allergies you have.  All medicines you are taking, including vitamins, herbs, eye drops, creams, and over-the-counter medicines.  Previous problems you or members of your family have had with the use of anesthetics.  Any blood disorders you have.  Previous surgeries you have had.  Medical conditions you have.  Possibility of pregnancy, if this applies.  If you are currently breastfeeding. RISKS AND COMPLICATIONS Generally, this is a safe procedure. However, as with any procedure, complications can occur. Possible complications include:  You develop pain or pressure in the following areas:  Chest.  Jaw or neck.  Between your shoulder blades.  Radiating down your left arm.  Headache.  Dizziness or light-headedness.  Shortness of breath.  Increased or irregular heartbeat.  Low blood pressure.  Nausea or vomiting.  Flushing.  Redness going up the arm and slight pain during injection of medicine.  Heart attack (rare). BEFORE THE PROCEDURE  Avoid all forms of caffeine for 24 hours before your test or as directed by your health care provider. This includes coffee, tea (even decaffeinated tea), caffeinated sodas, chocolate, cocoa, and certain pain medicines.  Follow your health care provider's instructions regarding eating and drinking before the test.  Take your medicines as directed at regular times with water unless instructed otherwise. Exceptions may include:  If you have diabetes, ask how you are to take your insulin or pills. It is common to adjust insulin dosing the morning of the test.  If you are taking beta-blocker medicines, it is important to talk to your health care provider about these medicines well before the date of your test. Taking beta-blocker medicines may interfere with the test. In some cases, these medicines  need to be changed or stopped 24 hours or more before the test.  If you wear a nitroglycerin patch, it may need to be removed prior to the test. Ask your health care provider if the patch should be removed before the test.  If you use an inhaler for any breathing condition, bring it with you to the test.  If you are an outpatient, bring a snack so you can eat right after the stress phase of the test.  Do not smoke for 4 hours prior to the test or as directed by your health care provider.  Do not apply lotions, powders, creams, or oils on your chest prior to the test.  Wear comfortable shoes and clothing. Let your health care provider know if you were unable to complete or follow the preparations for your test. PROCEDURE  Multiple patches (electrodes) will be put on your chest. If needed, small areas of your chest may be shaved to get better contact with the electrodes. Once the electrodes are attached to your body, multiple wires will be attached to the electrodes, and your heart rate will be monitored.  An IV access will be started. A nuclear trace (isotope) is given. The isotope may be given intravenously, or it may be swallowed. Nuclear refers to several types of radioactive isotopes, and the nuclear isotope lights up the arteries so that the nuclear images are clear. The isotope is absorbed  by your body. This results in low radiation exposure.  A resting nuclear image is taken to show how your heart functions at rest.  A medicine is given through the IV access.  A second scan is done about 1 hour after the medicine injection and determines how your heart functions under stress.  During this stress phase, you will be connected to an electrocardiogram machine. Your blood pressure and oxygen levels will be monitored. What to expect after the procedure  Your heart rate and blood pressure will be monitored after the test.  You may return to your normal schedule, including  diet,activities, and medicines, unless your health care provider tells you otherwise. This information is not intended to replace advice given to you by your health care provider. Make sure you discuss any questions you have with your health care provider. Document Released: 03/04/2009 Document Revised: 03/23/2016 Document Reviewed: 04/24/2016 Elsevier Interactive Patient Education  2017 [employer].   If you need a refill on your cardiac medications before your next appointment, please call your pharmacy.   DASH Eating Plan DASH stands for "Dietary Approaches to Stop Hypertension." The DASH eating plan is a healthy eating plan that has been shown to reduce high blood pressure (hypertension). It may also reduce your risk for type 2 diabetes, heart disease, and stroke. The DASH eating plan may also help with weight loss. What are tips for following this plan? General guidelines   Avoid eating more than 2,000 mg (milligrams) of salt (sodium) a day. If you have hypertension, you may need to reduce your sodium intake to 1,500 mg a day.  Limit alcohol intake to no more than 1 drink a day for nonpregnant women and 2 drinks a day for men. One drink equals 12 oz of beer, 5 oz of wine, or 1 oz of hard liquor.  Work with your health care provider to maintain a healthy body weight or to lose weight. Ask what an ideal weight is for you.  Get at least 30 minutes of exercise that causes your heart to beat faster (aerobic exercise) most days of the week. Activities may include walking, swimming, or biking.  Work with your health care provider or diet and nutrition specialist (dietitian) to adjust your eating plan to your individual calorie needs. Reading food labels   Check food labels for the amount of sodium per serving. Choose foods with less than 5 percent of the Daily Value of sodium. Generally, foods with less than 300 mg of sodium per serving fit into this eating plan.  To find whole grains,  look for the word "whole" as the first word in the ingredient list. Shopping   Buy products labeled as "low-sodium" or "no salt added."  Buy fresh foods. Avoid canned foods and premade or frozen meals. Cooking   Avoid adding salt when cooking. Use salt-free seasonings or herbs instead of table salt or sea salt. Check with your health care provider or pharmacist before using salt substitutes.  Do not fry foods. Cook foods using healthy methods such as baking, boiling, grilling, and broiling instead.  Cook with heart-healthy oils, such as olive, canola, soybean, or sunflower oil. Meal planning    Eat a balanced diet that includes:  5 or more servings of fruits and vegetables each day. At each meal, try to fill half of your plate with fruits and vegetables.  Up to 6-8 servings of whole grains each day.  Less than 6 oz of lean meat, poultry, or fish  each day. A 3-oz serving of meat is about the same size as a deck of cards. One egg equals 1 oz.  2 servings of low-fat dairy each day.  A serving of nuts, seeds, or beans 5 times each week.  Heart-healthy fats. Healthy fats called Omega-3 fatty acids are found in foods such as flaxseeds and coldwater fish, like sardines, salmon, and mackerel.  Limit how much you eat of the following:  Canned or prepackaged foods.  Food that is high in trans fat, such as fried foods.  Food that is high in saturated fat, such as fatty meat.  Sweets, desserts, sugary drinks, and other foods with added sugar.  Full-fat dairy products.  Do not salt foods before eating.  Try to eat at least 2 vegetarian meals each week.  Eat more home-cooked food and less restaurant, buffet, and fast food.  When eating at a restaurant, ask that your food be prepared with less salt or no salt, if possible. What foods are recommended? The items listed may not be a complete list. Talk with your dietitian about what dietary choices are best for you. Grains   Whole-grain or whole-wheat bread. Whole-grain or whole-wheat pasta. Brown rice. [place]. Bulgur. Whole-grain and low-sodium cereals. Pita bread. Low-fat, low-sodium crackers. Whole-wheat flour tortillas. Vegetables  Fresh or frozen vegetables (raw, steamed, roasted, or grilled). Low-sodium or reduced-sodium tomato and vegetable juice. Low-sodium or reduced-sodium tomato sauce and tomato paste. Low-sodium or reduced-sodium canned vegetables. Fruits  All fresh, dried, or frozen fruit. Canned fruit in natural juice (without added sugar). Meat and other protein foods  Skinless chicken or [place]. Ground chicken or [place]. Pork with fat trimmed off. Fish and seafood. Egg whites. Dried beans, peas, or lentils. Unsalted nuts, nut butters, and seeds. Unsalted canned beans. Lean cuts of beef with fat trimmed off. Low-sodium, lean deli meat. Dairy  Low-fat (1%) or fat-free (skim) milk. Fat-free, low-fat, or reduced-fat cheeses. Nonfat, low-sodium ricotta or cottage cheese. Low-fat or nonfat yogurt. Low-fat, low-sodium cheese. Fats and oils  Soft margarine without trans fats. Vegetable oil. Low-fat, reduced-fat, or light mayonnaise and salad dressings (reduced-sodium). Canola, safflower, olive, soybean, and sunflower oils. Avocado. Seasoning and other foods  Herbs. Spices. Seasoning mixes without salt. Unsalted popcorn and pretzels. Fat-free sweets. What foods are not recommended? The items listed may not be a complete list. Talk with your dietitian about what dietary choices are best for you. Grains  Baked goods made with fat, such as croissants, muffins, or some breads. Dry pasta or rice meal packs. Vegetables  Creamed or fried vegetables. Vegetables in a cheese sauce. Regular canned vegetables (not low-sodium or reduced-sodium). Regular canned tomato sauce and paste (not low-sodium or reduced-sodium). Regular tomato and vegetable juice (not low-sodium or reduced-sodium). Angie Fava. Olives. Fruits   Canned fruit in a light or heavy syrup. Fried fruit. Fruit in cream or butter sauce. Meat and other protein foods  Fatty cuts of meat. Ribs. Fried meat. Berniece Salines. Sausage. Bologna and other processed lunch meats. Salami. Fatback. Hotdogs. Bratwurst. Salted nuts and seeds. Canned beans with added salt. Canned or smoked fish. Whole eggs or egg yolks. Chicken or [place] with skin. Dairy  Whole or 2% milk, cream, and half-and-half. Whole or full-fat cream cheese. Whole-fat or sweetened yogurt. Full-fat cheese. Nondairy creamers. Whipped toppings. Processed cheese and cheese spreads. Fats and oils  Butter. Stick margarine. Lard. Shortening. Ghee. Bacon fat. Tropical oils, such as coconut, palm kernel, or palm oil. Seasoning and other foods  Salted popcorn  and pretzels. Onion salt, garlic salt, seasoned salt, table salt, and sea salt. Worcestershire sauce. Tartar sauce. Barbecue sauce. Teriyaki sauce. Soy sauce, including reduced-sodium. Steak sauce. Canned and packaged gravies. Fish sauce. Oyster sauce. Cocktail sauce. Horseradish that you find on the shelf. Ketchup. Mustard. Meat flavorings and tenderizers. Bouillon cubes. Hot sauce and Tabasco sauce. Premade or packaged marinades. Premade or packaged taco seasonings. Relishes. Regular salad dressings. Where to find more information:  National Heart, Lung, and [place]: https://wilson-eaton.com/  American Heart Association: www.heart.org Summary  The DASH eating plan is a healthy eating plan that has been shown to reduce high blood pressure (hypertension). It may also reduce your risk for type 2 diabetes, heart disease, and stroke.  With the DASH eating plan, you should limit salt (sodium) intake to 2,300 mg a day. If you have hypertension, you may need to reduce your sodium intake to 1,500 mg a day.  When on the DASH eating plan, aim to eat more fresh fruits and vegetables, whole grains, lean proteins, low-fat dairy, and heart-healthy fats.  Work  with your health care provider or diet and nutrition specialist (dietitian) to adjust your eating plan to your individual calorie needs. This information is not intended to replace advice given to you by your health care provider. Make sure you discuss any questions you have with your health care provider. Document Released: 10/05/2011 Document Revised: 10/09/2016 Document Reviewed: 10/09/2016 Elsevier Interactive Patient Education  2017 [employer].

## 2017-03-15 ENCOUNTER — Telehealth: Payer: Self-pay

## 2017-03-15 ENCOUNTER — Other Ambulatory Visit: Payer: Self-pay | Admitting: *Deleted

## 2017-03-15 DIAGNOSIS — C3492 Malignant neoplasm of unspecified part of left bronchus or lung: Secondary | ICD-10-CM

## 2017-03-15 MED ORDER — ONDANSETRON HCL 8 MG PO TABS: 8.0000 mg | ORAL_TABLET | Freq: Two times a day (BID) | ORAL | 1 refills | Status: AC | PRN

## 2017-03-15 MED ORDER — MORPHINE SULFATE ER 30 MG PO TBCR: 30.0000 mg | EXTENDED_RELEASE_TABLET | Freq: Three times a day (TID) | ORAL | 0 refills | Status: DC

## 2017-03-15 NOTE — Addendum Note (Signed)
Addended by: Gaetano Net on: 03/15/2017 01:26 PM   Modules accepted: Orders

## 2017-03-15 NOTE — Telephone Encounter (Signed)
Pt called for morphine and zofran refills. zofran refilled per protocol. Message forwarded for morphine.  Please call pt when ready for pickup.

## 2017-03-15 NOTE — Telephone Encounter (Signed)
Morphine prescription signed and ready for pick up

## 2017-03-19 ENCOUNTER — Ambulatory Visit: Payer: Medicare Other

## 2017-03-19 ENCOUNTER — Other Ambulatory Visit (HOSPITAL_BASED_OUTPATIENT_CLINIC_OR_DEPARTMENT_OTHER): Payer: Medicare Other

## 2017-03-19 ENCOUNTER — Telehealth: Payer: Self-pay | Admitting: Hematology

## 2017-03-19 ENCOUNTER — Encounter: Payer: Self-pay | Admitting: Hematology

## 2017-03-19 ENCOUNTER — Ambulatory Visit (HOSPITAL_BASED_OUTPATIENT_CLINIC_OR_DEPARTMENT_OTHER): Payer: Medicare Other | Admitting: Hematology

## 2017-03-19 VITALS — BP 105/62 | HR 98 | Temp 98.0°F | Resp 18 | Ht 69.0 in | Wt 133.8 lb

## 2017-03-19 DIAGNOSIS — F17211 Nicotine dependence, cigarettes, in remission: Secondary | ICD-10-CM | POA: Diagnosis not present

## 2017-03-19 DIAGNOSIS — E440 Moderate protein-calorie malnutrition: Secondary | ICD-10-CM

## 2017-03-19 DIAGNOSIS — R630 Anorexia: Secondary | ICD-10-CM

## 2017-03-19 DIAGNOSIS — C384 Malignant neoplasm of pleura: Secondary | ICD-10-CM | POA: Diagnosis present

## 2017-03-19 DIAGNOSIS — B1920 Unspecified viral hepatitis C without hepatic coma: Secondary | ICD-10-CM

## 2017-03-19 DIAGNOSIS — C3492 Malignant neoplasm of unspecified part of left bronchus or lung: Secondary | ICD-10-CM

## 2017-03-19 DIAGNOSIS — R0600 Dyspnea, unspecified: Secondary | ICD-10-CM

## 2017-03-19 DIAGNOSIS — C3482 Malignant neoplasm of overlapping sites of left bronchus and lung: Secondary | ICD-10-CM

## 2017-03-19 DIAGNOSIS — F1021 Alcohol dependence, in remission: Secondary | ICD-10-CM | POA: Diagnosis not present

## 2017-03-19 DIAGNOSIS — Z7189 Other specified counseling: Secondary | ICD-10-CM

## 2017-03-19 DIAGNOSIS — G893 Neoplasm related pain (acute) (chronic): Secondary | ICD-10-CM

## 2017-03-19 LAB — CBC & DIFF AND RETIC
BASO%: 0 % (ref 0.0–2.0)
Basophils Absolute: 0 10*3/uL (ref 0.0–0.1)
EOS%: 0 % (ref 0.0–7.0)
Eosinophils Absolute: 0 10*3/uL (ref 0.0–0.5)
HCT: 31.1 % — ABNORMAL LOW (ref 38.4–49.9)
HGB: 10 g/dL — ABNORMAL LOW (ref 13.0–17.1)
IMMATURE RETIC FRACT: 10.8 % — AB (ref 3.00–10.60)
LYMPH#: 0.4 10*3/uL — AB (ref 0.9–3.3)
LYMPH%: 8.9 % — ABNORMAL LOW (ref 14.0–49.0)
MCH: 32.1 pg (ref 27.2–33.4)
MCHC: 32.2 g/dL (ref 32.0–36.0)
MCV: 99.7 fL — ABNORMAL HIGH (ref 79.3–98.0)
MONO#: 0.2 10*3/uL (ref 0.1–0.9)
MONO%: 3.5 % (ref 0.0–14.0)
NEUT#: 4.1 10*3/uL (ref 1.5–6.5)
NEUT%: 87.6 % — AB (ref 39.0–75.0)
PLATELETS: 282 10*3/uL (ref 140–400)
RBC: 3.12 10*6/uL — AB (ref 4.20–5.82)
RDW: 18.8 % — AB (ref 11.0–14.6)
RETIC %: 1.72 % (ref 0.80–1.80)
RETIC CT ABS: 53.66 10*3/uL (ref 34.80–93.90)
WBC: 4.6 10*3/uL (ref 4.0–10.3)

## 2017-03-19 LAB — COMPREHENSIVE METABOLIC PANEL
ALK PHOS: 89 U/L (ref 40–150)
ALT: 11 U/L (ref 0–55)
ANION GAP: 10 meq/L (ref 3–11)
AST: 21 U/L (ref 5–34)
Albumin: 3.2 g/dL — ABNORMAL LOW (ref 3.5–5.0)
BILIRUBIN TOTAL: 0.58 mg/dL (ref 0.20–1.20)
BUN: 13.3 mg/dL (ref 7.0–26.0)
CHLORIDE: 103 meq/L (ref 98–109)
CO2: 22 meq/L (ref 22–29)
Calcium: 10.7 mg/dL — ABNORMAL HIGH (ref 8.4–10.4)
Creatinine: 1 mg/dL (ref 0.7–1.3)
EGFR: >90 mL/min/{1.73_m2} (ref >90)
GLUCOSE: 114 mg/dL (ref 70–140)
Potassium: 4.9 mEq/L (ref 3.5–5.1)
SODIUM: 135 meq/L — AB (ref 136–145)
TOTAL PROTEIN: 9.5 g/dL — AB (ref 6.4–8.3)

## 2017-03-19 MED ORDER — MORPHINE SULFATE ER 60 MG PO TBCR: 60.0000 mg | EXTENDED_RELEASE_TABLET | Freq: Two times a day (BID) | ORAL | 0 refills | Status: DC

## 2017-03-19 MED ORDER — MORPHINE SULFATE ER 30 MG PO TBCR: 60.0000 mg | EXTENDED_RELEASE_TABLET | ORAL | 0 refills | Status: DC

## 2017-03-19 MED ORDER — OXYCODONE HCL 10 MG PO TABS: 10.0000 mg | ORAL_TABLET | ORAL | 0 refills | Status: DC | PRN

## 2017-03-19 NOTE — Progress Notes (Signed)
[name]  HEMATOLOGY ONCOLOGY PROGRESS NOTE  Date of service: .03/19/2017  Patient Care Team: System, Provider Not In as PCP - General  CC: f/u for metastatic Non small cell lung cancer  Diagnosis: Newly diagnosed Metastatic lung adenocarcinoma (with M1a disease - pleural nodules and malignant pleural effusion)  Current Treatment: Carboplatin + Alimta s/p 2 cycles.  HPI  Joshua White is a 66 y.o. male who has been referred to [place] by Dr Aldine Contes, MD for evaluation and management of likely metastatic lung cancer.  Patient has a history of seizures, G6PD deficiency, right bundle branch block , heavy smoking history and hepatitis C and is just about done with his 12 week treatment with Harvoni for his hepatitis C. Patient notes that he has had progressive fatigue and anorexia for the last 2-3 months. He initially assigned his symptoms to his treatment for hepatitis C with Harvoni but was concerned when the symptoms persisted. He notes he has lost about 15-20 pounds over the last couple of months. She presented to the emergency room about a week ago with significant shortness of breath for 2-3 weeks  and was noted to have a large pleural effusion and had a therapeutic thoracentesis with removal of 1 L fluid which was sent for analysis. It was noted to be exudative with cytology concerning for atypical cells which are likely malignant.  Patient presented to his primary care physician in follow-up and was noted to have significant shortness of breath again and recurrent large left-sided pleural effusion for which she was admitted. He had a CT of the chest which showed an extremely large left-sided pleural effusion occupying majority of the left hemithorax with dense consolidation of most of the left lung. Diffuse infiltrating mass along the left side of the mediastinum measuring 3.6 cm and extending into much of the pleura and left hemithorax . He was also noted to have mediastinal lymphadenopathy  and a 2.1 cm nodular opacity in the right lung . Concern for possible metastases in the epicardial fat pad . Also had mildly prominent retroperitoneal lymph nodes measuring about 8 mm which were nonspecific . CT abdomen showed no other obvious metastatic disease.  Patient had an MRI of the brain which shows no evidence of metastatic disease. 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  Patient has had a repeat therapeutic thoracentesis today cytology from this is currently pending. Cardiothoracic surgery was consulted for his recurrent large pleural effusion and there are plans for a VATS procedure with pleural biopsy and possible placement of a Pleurx catheter on 12/20/2016.  Patient notes his breathing is somewhat better after the thoracentesis. Has some left sided chest pain.  He notes that he smoked about 1-2 packs a day since age 31 and quit about a year ago. No headaches no focal neurological deficits. No abdominal pain or distention. Weight loss of about 20 pounds over the last couple months.  Pathology results showed poorly differentiated lung adenocarcinoma.  PDL1 testing 0% Other [employer] [facility] results pending  INTERVAL HISTORY:  Patient is here for follow-up prior to his 4th cycle of palliative carboplatin and Alimta chemotherapy . Follow-up CT chest shows concerns of disease progression. CT scan results were discussed in detail with the patient. He notes increasing shortness of breath and some chest pain that is not controlled with his current dose of narcotics. New prescriptions were given to optimize his pain control and shortness of breath. We discussed second line treatment options  including best supportive cares versus palliative immunotherapy versus second line palliative chemotherapy. After extensive discussion the pros and cons the patient prefers to pursue second line treatment with Atezolizumab . Patient education done regarding  this medication. Orders were placed to start this treatment within a week. No fevers chills or night sweats.  REVIEW OF SYSTEMS:    10 Point review of systems of done and is negative except as noted above.  . Past Medical History:  Diagnosis Date  . G6PD deficiency ([place])   . H/O orthostatic hypotension   . Lung cancer ([place]) dx'd 01/2017  . Pneumonia   . RBBB (right bundle branch block)   . Seizures ([place])     . Past Surgical History:  Procedure Laterality Date  . APPENDECTOMY    . CHEST TUBE INSERTION Left 12/20/2016   Procedure: INSERTION PLEURAL DRAINAGE CATHETER;  Surgeon: Grace Isaac, MD;  Location: [place];  Service: Thoracic;  Laterality: Left;  . PLEURAL BIOPSY Left 12/20/2016   Procedure: PLEURAL BIOPSY;  Surgeon: Grace Isaac, MD;  Location: [place];  Service: Thoracic;  Laterality: Left;  . US ECHOCARDIOGRAPHY  05/12/2008   EF 55-60%  . VIDEO ASSISTED THORACOSCOPY (VATS) W/TALC PLEUADESIS Left 12/20/2016   Procedure: VIDEO ASSISTED THORACOSCOPY (VATS) W/TALC PLEURADESIS;  Surgeon: Grace Isaac, MD;  Location: [place];  Service: Thoracic;  Laterality: Left;  [name] VIDEO BRONCHOSCOPY N/A 12/20/2016   Procedure: VIDEO BRONCHOSCOPY;  Surgeon: Grace Isaac, MD;  Location: [facility];  Service: Thoracic;  Laterality: N/A;    . Social History  Substance Use Topics  . Smoking status: Former Smoker    Packs/day: 2.00    Years: 30.00    Types: Cigars  . Smokeless tobacco: Never Used     Comment: Quit ~2017  . Alcohol use No     Comment: Former use    ALLERGIES:  is allergic to tuberculin purified protein derivative and sulfa antibiotics.  MEDICATIONS:  Current Outpatient Prescriptions  Medication Sig Dispense Refill  . aspirin EC 81 MG tablet Take 81 mg by mouth daily.    [name] dexamethasone (DECADRON) 4 MG tablet Take 1 tablet (4 mg total) by mouth 2 (two) times daily. 10 tablet 0  . dronabinol (MARINOL) 5 MG capsule Take 1 capsule (5 mg total) by mouth 2 (two) times  daily before a meal. 60 capsule 0  . folic acid (FOLVITE) 1 MG tablet Take 1 tablet (1 mg total) by mouth daily. Start 5-7 days before Alimta chemotherapy. Continue until 21 days after Alimta completed. 100 tablet 3  . morphine (MS CONTIN) 60 MG 12 hr tablet Take 1 tablet (60 mg total) by mouth every 12 (twelve) hours. Increase to 69m po q8h if pain/shortness of breath uncontrolled 90 tablet 0  . ondansetron (ZOFRAN) 8 MG tablet Take 1 tablet (8 mg total) by mouth 2 (two) times daily as needed for refractory nausea / vomiting. Start on day 3 after chemo. 30 tablet 1  . oxyCODONE 10 MG TABS Take 1 tablet (10 mg total) by mouth every 4 (four) hours as needed for severe pain or breakthrough pain. 60 tablet 0  . polyethylene glycol (MIRALAX) packet Take 17 g by mouth daily. 30 each 1  . prochlorperazine (COMPAZINE) 10 MG tablet Take 1 tablet (10 mg total) by mouth every 6 (six) hours as needed (Nausea or vomiting). 30 tablet 1  . senna-docusate (SENNA S) 8.6-50 MG tablet Take 2 tablets by mouth 2 (two) times daily. 6[place]  tablet 2   No current facility-administered medications for this visit.     PHYSICAL EXAMINATION: ECOG PERFORMANCE STATUS: 2 - Symptomatic, <50% confined to bed  . Vitals:   03/19/17 1320  BP: 105/62  Pulse: 98  Resp: 18  Temp: 98 F (36.7 C)    Filed Weights   03/19/17 1320  Weight: 133 lb 12.8 oz (60.7 kg)   .Body mass index is 19.76 kg/m. . Wt Readings from Last 3 Encounters:  03/19/17 133 lb 12.8 oz (60.7 kg)  03/14/17 137 lb (62.1 kg)  03/02/17 141 lb (64 kg)    GENERAL:alert, in no acute distress and comfortable SKIN: no acute rashes, no significant lesions EYES: conjunctiva are pink and non-injected, sclera anicteric OROPHARYNX: MMM, no exudates, no oropharyngeal erythema or ulceration NECK: supple, no JVD LYMPH:  no palpable lymphadenopathy in the cervical, axillary or inguinal regions LUNGS:Decreased at entry left lung base. HEART: regular rate &  rhythm ABDOMEN:  normoactive bowel sounds , non tender, not distended. Extremity: no pedal edema PSYCH: alert & oriented x 3 with fluent speech NEURO: no focal motor/sensory deficits  LABORATORY DATA:   I have reviewed the data as listed  . CBC Latest Ref Rng & Units 03/19/2017 03/03/2017 03/02/2017  WBC 4.0 - 10.3 10e3/uL 4.6 5.0 8.0  Hemoglobin 13.0 - 17.1 g/dL 10.0(L) 10.4(L) 10.2(L)  Hematocrit 38.4 - 49.9 % 31.1(L) 31.5(L) 30.7(L)  Platelets 140 - 400 10e3/uL 282 199 206    . CMP Latest Ref Rng & Units 03/19/2017 03/03/2017 03/02/2017  Glucose 70 - 140 mg/dl 114 116(H) 87  BUN 7.0 - 26.0 mg/dL 13.3 18 22(H)  Creatinine 0.7 - 1.3 mg/dL 1.0 0.66 0.77  Sodium 136 - 145 mEq/L 135(L) 130(L) 133(L)  Potassium 3.5 - 5.1 mEq/L 4.9 4.8 4.2  Chloride 101 - 111 mmol/L - 98(L) 101  CO2 22 - 29 mEq/L 22 25 24   Calcium 8.4 - 10.4 mg/dL 10.7(H) 9.7 9.4  Total Protein 6.4 - 8.3 g/dL 9.5(H) 8.5(H) -  Total Bilirubin 0.20 - 1.20 mg/dL 0.58 0.9 -  Alkaline Phos 40 - 150 U/L 89 86 -  AST 5 - 34 U/L 21 22 -  ALT 0 - 55 U/L 11 12(L) -          RADIOGRAPHIC STUDIES: I have personally reviewed the radiological images as listed and agreed with the findings in the report. Dg Chest 2 View  Result Date: 03/02/2017 CLINICAL DATA:  Shortness of breath.  Left pleural effusion. EXAM: CHEST  2 VIEW COMPARISON:  01/25/2017 FINDINGS: Cardiomediastinal silhouette is normal. Mediastinal contours appear intact. There is no evidence of pneumothorax. There is slightly enlarged left pleural effusion. The known left perihilar mass has stable radiographic appearance. Left lower lobe atelectasis versus airspace consolidation. The right basilar nodular opacity seen by CT in February 2018 is not appreciated radiographically. Osseous structures are without acute abnormality. Soft tissues are grossly normal. IMPRESSION: Slightly enlarged left pleural effusion, compared to patient's last radiograph dated 01/25/2017. Stable  radiographic appearance of left suprahilar mass. Electronically Signed   By: Fidela Salisbury M.D.   On: 03/02/2017 11:32   Ct Chest W Contrast  Result Date: 03/13/2017 CLINICAL DATA:  Chest pain, cough, and shortness of breath for 1 month. Restaging of metastatic lung cancer. Prior talc pleurodesis. EXAM: CT CHEST, ABDOMEN, AND PELVIS WITH CONTRAST TECHNIQUE: Multidetector CT imaging of the chest, abdomen and pelvis was performed following the standard protocol during bolus administration of intravenous contrast. CONTRAST:  145m  ISOVUE-300 IOPAMIDOL (ISOVUE-300) INJECTION 61% COMPARISON:  Multiple exams, including 12/18/2016 FINDINGS: CT CHEST FINDINGS Cardiovascular: Coronary and aortic arch atherosclerotic calcification. Mild ascending aortic aneurysm at 4.0 cm, stable. Mild calcification of the leaflets of the aortic valve. Mediastinum/Nodes: AP window lymph node 1.3 cm in short axis, previously 1.6 cm. Adjacent 1.2 cm AP window lymph node on image 29/2, previously 1.0 cm. Right lower paratracheal node 0.9 cm in short axis on image 28/2. Lower paraesophageal lymph nodes are present and there is a pericardial node near the cardiac apex measuring 1.1 cm in short axis on image 56/2, essentially stable. Abnormal nodularity in the pericardial adipose tissue confluent with the pleural rind. Lungs/Pleura: Increase in thickness of the left pleural rind. This includes the basal all and parietal pleura and has some talc deposition along the margin which is new. The pleural rind is irregular with a lobulated border along adjacent pleural adipose tissue, and a masslike component of the pleural rind in the paramediastinal location measures 4.1 by 3.5 cm, formerly 3.6 by 3.0 cm. Reduced volume of the left pleural effusion but with considerable continued volume loss is specially in the left lower lobe. Paraseptal and a less degree of centrilobular emphysema. There is some minimal tree-in-bud opacity in the right upper  lobe and right middle lobe probably from atypical infectious bronchiolitis. Right lower lobe scarring, right lower lobe atelectasis is improved from prior. Musculoskeletal: Unremarkable CT ABDOMEN PELVIS FINDINGS Hepatobiliary: Contracted gallbladder.  Otherwise unremarkable. Pancreas: Pancreas divisum. Spleen: Unremarkable Adrenals/Urinary Tract: Small hypodense lesions the right kidney lower pole are likely cysts but technically too small to characterize. Stomach/Bowel: Prominent stool throughout the colon favors constipation. Sigmoid colon diverticulosis. Vascular/Lymphatic: Aortoiliac atherosclerotic vascular disease. Fusiform infrarenal abdominal aortic ectasia at 2.7 cm. There is gastrohepatic ligament, retrocrural, and periaortic adenopathy. An index left periaortic node measures 1 cm in short axis on image 75/2, formerly the same. Reproductive: Unremarkable Other: No supplemental non-categorized findings. Musculoskeletal: Bridging spurring of both sacroiliac joints. Mild spurring of both hips. IMPRESSION: 1. Improved aeration in the left lower lobe, with reduced size of the left pleural effusion. That said, the pleural rind demonstrates increase thickening and nodularity, with the dominant pleural paramediastinal tumor on the left significantly enlarged in size. Evidence of interval type pleurodesis. On balance, the amount of adenopathy in the chest and abdomen appears stable compared to prior. 2. Mild ascending aortic aneurysm at 4.0 cm. Recommend annual imaging followup by CTA or MRA. This recommendation follows 2010 ACCF/AHA/AATS/ACR/ASA/SCA/SCAI/SIR/STS/SVM Guidelines for the Diagnosis and Management of Patients with Thoracic Aortic Disease. Circulation. 2010; 121: H212-Y482 3. Fusiform infrarenal abdominal aortic ectasia. 4. Coronary atherosclerosis. Aortic Atherosclerosis (ICD10-I70.0) and Emphysema (ICD10-J43.9). 5. Pancreas divisum. Electronically Signed   By: Van Clines M.D.   On: 03/13/2017  08:59   [place] Chest  Result Date: 03/02/2017 CLINICAL DATA:  Patient with lung cancer and history of pleural effusion. EXAM: CHEST ULTRASOUND COMPARISON:  None. FINDINGS: Limited US of the left chest. Small amount of pleural fluid not amenable to thoracentesis. IMPRESSION: Limited US of the left chest performed which shows very small amount of pleural fluid which is not amenable to thoracentesis. Read by:  Brynda Greathouse PA-C Electronically Signed   By: Jerilynn Mages.  Shick M.D.   On: 03/02/2017 16:20   Ct Abdomen Pelvis W Contrast  Result Date: 03/13/2017 CLINICAL DATA:  Chest pain, cough, and shortness of breath for 1 month. Restaging of metastatic lung cancer. Prior talc pleurodesis. EXAM: CT CHEST, ABDOMEN, AND PELVIS WITH CONTRAST  TECHNIQUE: Multidetector CT imaging of the chest, abdomen and pelvis was performed following the standard protocol during bolus administration of intravenous contrast. CONTRAST:  13m ISOVUE-300 IOPAMIDOL (ISOVUE-300) INJECTION 61% COMPARISON:  Multiple exams, including 12/18/2016 FINDINGS: CT CHEST FINDINGS Cardiovascular: Coronary and aortic arch atherosclerotic calcification. Mild ascending aortic aneurysm at 4.0 cm, stable. Mild calcification of the leaflets of the aortic valve. Mediastinum/Nodes: AP window lymph node 1.3 cm in short axis, previously 1.6 cm. Adjacent 1.2 cm AP window lymph node on image 29/2, previously 1.0 cm. Right lower paratracheal node 0.9 cm in short axis on image 28/2. Lower paraesophageal lymph nodes are present and there is a pericardial node near the cardiac apex measuring 1.1 cm in short axis on image 56/2, essentially stable. Abnormal nodularity in the pericardial adipose tissue confluent with the pleural rind. Lungs/Pleura: Increase in thickness of the left pleural rind. This includes the basal all and parietal pleura and has some talc deposition along the margin which is new. The pleural rind is irregular with a lobulated border along adjacent pleural  adipose tissue, and a masslike component of the pleural rind in the paramediastinal location measures 4.1 by 3.5 cm, formerly 3.6 by 3.0 cm. Reduced volume of the left pleural effusion but with considerable continued volume loss is specially in the left lower lobe. Paraseptal and a less degree of centrilobular emphysema. There is some minimal tree-in-bud opacity in the right upper lobe and right middle lobe probably from atypical infectious bronchiolitis. Right lower lobe scarring, right lower lobe atelectasis is improved from prior. Musculoskeletal: Unremarkable CT ABDOMEN PELVIS FINDINGS Hepatobiliary: Contracted gallbladder.  Otherwise unremarkable. Pancreas: Pancreas divisum. Spleen: Unremarkable Adrenals/Urinary Tract: Small hypodense lesions the right kidney lower pole are likely cysts but technically too small to characterize. Stomach/Bowel: Prominent stool throughout the colon favors constipation. Sigmoid colon diverticulosis. Vascular/Lymphatic: Aortoiliac atherosclerotic vascular disease. Fusiform infrarenal abdominal aortic ectasia at 2.7 cm. There is gastrohepatic ligament, retrocrural, and periaortic adenopathy. An index left periaortic node measures 1 cm in short axis on image 75/2, formerly the same. Reproductive: Unremarkable Other: No supplemental non-categorized findings. Musculoskeletal: Bridging spurring of both sacroiliac joints. Mild spurring of both hips. IMPRESSION: 1. Improved aeration in the left lower lobe, with reduced size of the left pleural effusion. That said, the pleural rind demonstrates increase thickening and nodularity, with the dominant pleural paramediastinal tumor on the left significantly enlarged in size. Evidence of interval type pleurodesis. On balance, the amount of adenopathy in the chest and abdomen appears stable compared to prior. 2. Mild ascending aortic aneurysm at 4.0 cm. Recommend annual imaging followup by CTA or MRA. This recommendation follows 2010  ACCF/AHA/AATS/ACR/ASA/SCA/SCAI/SIR/STS/SVM Guidelines for the Diagnosis and Management of Patients with Thoracic Aortic Disease. Circulation. 2010; 121: [id number]. Fusiform infrarenal abdominal aortic ectasia. 4. Coronary atherosclerosis. Aortic Atherosclerosis (ICD10-I70.0) and Emphysema (ICD10-J43.9). 5. Pancreas divisum. Electronically Signed   By: WVan ClinesM.D.   On: 03/13/2017 08:59    ASSESSMENT & PLAN:    66year old male with  #1 Metastatic lung adenocarcinoma (with M1a disease - pleural nodules and malignant pleural effusion) PDL1 0% Negative for EGFR, ALK, ROS1 and BRAF mutations. Patient is now status post 3 cycles of palliative carboplatin and Alimta chemotherapy which he has tolerated well. CT chest abdomen pelvis 03/13/2017 shows concern for disease progression in the left lung.  #2 Recurrent left-sided malignant pleural effusion -now s/p pleurex catheter placement and drainage. Pleurx catheter removed on outpatient follow-up with cardiothoracic surgery . No overt evidence of metastatic disease  outside of the chest cavity or in the brain. Pathology showed poorly differentiated adenocarcinoma.  #3  increased dyspnea on exertion - this appears to be related to the patient's lung cancer progression. Plan -no prohibitive toxicity from first 3 cycles of carboplatin/Alimta. -CT scan of the chest/abdomen pelvis results were discussed in detail with the patient and his accompanying family. -He has been losing weight and notes decreased appetite. -We'll hold off on additional carboplatin/Alimta palliative chemotherapy due to disease progression. -We discussed second line treatment options including best supportive cares versus palliative immunotherapy versus second line palliative chemotherapy. -After extensive discussion the pros and cons the patient prefers to pursue second line treatment with Atezolizumab . Patient education done regarding this medication. Orders were  placed to start this treatment within a week.  #4 ex-smoker with more than 100-pack-year history of smoking  -he has quit smoking about a year ago  #5 exalcohol abuse and remote IVDU- patient notes no recent IV drug use and has been sober from alcohol use from a year.  #6 hepatitis C has finished treatment with harvonisoon before being diagnosed with lung cancer.  #5 moderate protein calorie malnutrition and anorexia due to malignancy and recent treatment for hepatitis C. . Wt Readings from Last 3 Encounters:  03/19/17 133 lb 12.8 oz (60.7 kg)  03/14/17 137 lb (62.1 kg)  03/02/17 141 lb (64 kg)   -continue on Marinol 5 mg by mouth twice a day-- patient notes some improvement in his appetite.  #6 cancer related pain - better controlled -increased MS contin to 45m po q12h with plan to increase to 654mpo q8h if pain and DOE/SOB uncontrolled.  -Oxycodone every 4 hours as needed for breakthrough pain -Senna S and MiraLAX for bowel prophylaxis.   #7 Ramsey anemia- stable  #8 Thrombocytopenia - resolved platelet counts are normal today -monitor  Cancel planned chemotherapy today Switch to Atezolizumab within 1 week RTC with Dr KaIrene Limbo-[facility].   I spent 30 minutes counseling the patient face to face. The total time spent in the appointment was 40 minutes and more than 50% was on counseling and direct patient cares.    GaSullivan LoneD MSGulf StreamAHIVMS SCAdvanced Endoscopy Center PLLCTAdventhealth Sebringematology/Oncology Physician CoGalesburg Cottage Hospital(Office):       [phone] cell):  [phone]):           [phone]

## 2017-03-19 NOTE — Telephone Encounter (Signed)
Gave patient avs report and appointments for May and June. New treatment to start 5/29. Other existing appointments cxd.

## 2017-03-19 NOTE — Progress Notes (Signed)
DISCONTINUE ON PATHWAY REGIMEN - Non-Small Cell Lung     A cycle is every 21 days:     Pemetrexed      Carboplatin   **Always confirm dose/schedule in your pharmacy ordering system**    REASON: Disease Progression PRIOR TREATMENT: [id number]: Carboplatin AUC=5 + Pemetrexed 500 mg/m2 q21 Days x 4 Cycles TREATMENT RESPONSE: Progressive Disease (PD)  START ON PATHWAY REGIMEN - Non-Small Cell Lung     A cycle is every 21 days:     Atezolizumab   **Always confirm dose/schedule in your pharmacy ordering system**    Patient Characteristics: Stage IV Metastatic, Non Squamous, Second Line - Chemotherapy/Immunotherapy, PS = 2, No Prior PD-1/PD-L1  Inhibitor and Immunotherapy Candidate AJCC T Category: TX Current Disease Status: Distant Metastases AJCC N Category: NX AJCC M Category: M1a AJCC 8 Stage Grouping: IVA Histology: Non Squamous Cell ROS1 Rearrangement Status: Quantity Not Sufficient T790M Mutation Status: Not Applicable - EGFR Mutation Negative/Unknown Other Mutations/Biomarkers: No Other Actionable Mutations PD-L1 Expression Status: PD-L1 Negative Chemotherapy/Immunotherapy LOT: Second Line Chemotherapy/Immunotherapy Molecular Targeted Therapy: Not Appropriate ALK Translocation Status: Quantity Not Sufficient Would you be surprised if this patient died  in the next year? I would NOT be surprised if this patient died in the next year EGFR Mutation Status: Quantity Not Sufficient BRAF V600E Mutation Status: Quantity Not Sufficient Performance Status: PS = 2 Immunotherapy Candidate Status: Candidate for Immunotherapy Prior Immunotherapy Status: No Prior PD-1/PD-L1 Inhibitor  Intent of Therapy: Non-Curative / Palliative Intent, Discussed with Patient

## 2017-03-19 NOTE — Patient Instructions (Signed)
Thank you for choosing [facility] to provide your oncology and hematology care.  To afford each patient quality time with our providers, please arrive 30 minutes before your scheduled appointment time.  If you arrive late for your appointment, you may be asked to reschedule.  We strive to give you quality time with our providers, and arriving late affects you and other patients whose appointments are after yours.   If you are a no show for multiple scheduled visits, you may be dismissed from the clinic at the providers discretion.    Again, thank you for choosing [facility], our hope is that these requests will decrease the amount of time that you wait before being seen by our physicians.  ______________________________________________________________________  Should you have questions after your visit to the [facility], please contact our office at [phone] between the hours of 8:30 and 4:30 p.m.    Voicemails left after 4:30p.m will not be returned until the following business day.    For prescription refill requests, please have your pharmacy contact us directly.  Please also try to allow 48 hours for prescription requests.    Please contact the scheduling department for questions regarding scheduling.  For scheduling of procedures such as PET scans, CT scans, MRI, Ultrasound, etc please contact central scheduling at [phone].    Resources For Cancer Patients and Caregivers:   Oncolink.org:  A wonderful resource for patients and healthcare providers for information regarding your disease, ways to tract your treatment, what to expect, etc.     American Cancer Society:  800-227-2345  Can help patients locate various types of support and financial assistance  Cancer Care: 1-800-813-HOPE (4673) Provides financial assistance, online support groups, medication/co-pay assistance.    [place] DSS:  336-641-3447 Where to apply for food  stamps, Medicaid, and utility assistance  Medicare Rights Center: 800-333-4114 Helps people with Medicare understand their rights and benefits, navigate the Medicare system, and secure the quality healthcare they deserve  SCAT: 336-333-6589 [place] [employer]'s shared-ride transportation service for eligible riders who have a disability that prevents them from riding the fixed route bus.    For additional information on assistance programs please contact our social worker:   Grier Hock/Abigail Elmore:  [phone]            

## 2017-03-20 DIAGNOSIS — Z7189 Other specified counseling: Secondary | ICD-10-CM | POA: Insufficient documentation

## 2017-03-22 ENCOUNTER — Encounter (HOSPITAL_COMMUNITY): Payer: Medicare Other

## 2017-03-27 ENCOUNTER — Ambulatory Visit (HOSPITAL_BASED_OUTPATIENT_CLINIC_OR_DEPARTMENT_OTHER): Payer: Medicare Other

## 2017-03-27 ENCOUNTER — Telehealth: Payer: Self-pay | Admitting: *Deleted

## 2017-03-27 ENCOUNTER — Other Ambulatory Visit: Payer: Medicare Other

## 2017-03-27 VITALS — BP 153/93 | HR 88 | Temp 97.6°F | Resp 18

## 2017-03-27 DIAGNOSIS — C384 Malignant neoplasm of pleura: Secondary | ICD-10-CM | POA: Diagnosis not present

## 2017-03-27 DIAGNOSIS — C3492 Malignant neoplasm of unspecified part of left bronchus or lung: Secondary | ICD-10-CM

## 2017-03-27 DIAGNOSIS — B182 Chronic viral hepatitis C: Secondary | ICD-10-CM

## 2017-03-27 DIAGNOSIS — Z5112 Encounter for antineoplastic immunotherapy: Secondary | ICD-10-CM | POA: Diagnosis present

## 2017-03-27 DIAGNOSIS — C3482 Malignant neoplasm of overlapping sites of left bronchus and lung: Secondary | ICD-10-CM

## 2017-03-27 MED ORDER — SODIUM CHLORIDE 0.9 % IV SOLN
Freq: Once | INTRAVENOUS | Status: AC
  Administered 2017-03-27: 17:00:00 via INTRAVENOUS

## 2017-03-27 MED ORDER — SODIUM CHLORIDE 0.9 % IV SOLN
1200.0000 mg | Freq: Once | INTRAVENOUS | Status: AC
  Administered 2017-03-27: 1200 mg via INTRAVENOUS
  Filled 2017-03-27: qty 20

## 2017-03-27 NOTE — Patient Instructions (Addendum)
[facility] Discharge Instructions for Patients Receiving Chemotherapy  Today you received the following chemotherapy agents: atezolizumab Gildardo Pounds).  To help prevent nausea and vomiting after your treatment, we encourage you to take your nausea medication as directed.    If you develop nausea and vomiting that is not controlled by your nausea medication, call the clinic.   BELOW ARE SYMPTOMS THAT SHOULD BE REPORTED IMMEDIATELY:  *FEVER GREATER THAN 100.5 F  *CHILLS WITH OR WITHOUT FEVER  NAUSEA AND VOMITING THAT IS NOT CONTROLLED WITH YOUR NAUSEA MEDICATION  *UNUSUAL SHORTNESS OF BREATH  *UNUSUAL BRUISING OR BLEEDING  TENDERNESS IN MOUTH AND THROAT WITH OR WITHOUT PRESENCE OF ULCERS  *URINARY PROBLEMS  *BOWEL PROBLEMS  UNUSUAL RASH Items with * indicate a potential emergency and should be followed up as soon as possible.  Feel free to call the clinic you have any questions or concerns. The clinic phone number is [phone].  Please show the [place] at check-in to the Emergency Department and triage nurse.

## 2017-03-27 NOTE — Telephone Encounter (Signed)
"  This is Animator (Caregiver) calling for the next appointment information."  Advised scheduled today at 3:00 pm.  "Thanks I thought I was correct just needed to confirm."

## 2017-03-28 ENCOUNTER — Telehealth: Payer: Self-pay | Admitting: *Deleted

## 2017-03-28 NOTE — Telephone Encounter (Signed)
-----   Message from Johann Capers, RN sent at 03/27/2017  5:42 PM EDT ----- Regarding: Dr. Irene Limbo pt f/u phone call Dr. Irene Limbo pt f/u phone call first-time tecentriq

## 2017-03-28 NOTE — Telephone Encounter (Signed)
Chemo follow up: No answer, left voicemail for patient call back.

## 2017-03-29 LAB — HEPATITIS C RNA QUANTITATIVE
HCV QUANT LOG: NOT DETECTED {Log_IU}/mL
HCV QUANT: NOT DETECTED [IU]/mL

## 2017-04-02 ENCOUNTER — Other Ambulatory Visit: Payer: Self-pay | Admitting: *Deleted

## 2017-04-02 MED ORDER — OXYCODONE HCL 10 MG PO TABS: 10.0000 mg | ORAL_TABLET | ORAL | 0 refills | Status: DC | PRN

## 2017-04-03 ENCOUNTER — Other Ambulatory Visit: Payer: Self-pay | Admitting: *Deleted

## 2017-04-03 DIAGNOSIS — C3492 Malignant neoplasm of unspecified part of left bronchus or lung: Secondary | ICD-10-CM

## 2017-04-04 ENCOUNTER — Encounter: Payer: Self-pay | Admitting: Hematology

## 2017-04-04 ENCOUNTER — Telehealth: Payer: Self-pay | Admitting: Hematology

## 2017-04-04 ENCOUNTER — Other Ambulatory Visit (HOSPITAL_BASED_OUTPATIENT_CLINIC_OR_DEPARTMENT_OTHER): Payer: Medicare Other

## 2017-04-04 ENCOUNTER — Ambulatory Visit (HOSPITAL_BASED_OUTPATIENT_CLINIC_OR_DEPARTMENT_OTHER): Payer: Medicare Other | Admitting: Hematology

## 2017-04-04 ENCOUNTER — Other Ambulatory Visit: Payer: Medicare Other

## 2017-04-04 VITALS — BP 130/74 | HR 97 | Temp 98.0°F | Resp 18 | Ht 69.0 in | Wt 137.0 lb

## 2017-04-04 DIAGNOSIS — C384 Malignant neoplasm of pleura: Secondary | ICD-10-CM | POA: Diagnosis not present

## 2017-04-04 DIAGNOSIS — G893 Neoplasm related pain (acute) (chronic): Secondary | ICD-10-CM | POA: Diagnosis not present

## 2017-04-04 DIAGNOSIS — Z79899 Other long term (current) drug therapy: Secondary | ICD-10-CM

## 2017-04-04 DIAGNOSIS — C3482 Malignant neoplasm of overlapping sites of left bronchus and lung: Secondary | ICD-10-CM

## 2017-04-04 DIAGNOSIS — C3490 Malignant neoplasm of unspecified part of unspecified bronchus or lung: Secondary | ICD-10-CM

## 2017-04-04 DIAGNOSIS — J910 Malignant pleural effusion: Secondary | ICD-10-CM

## 2017-04-04 DIAGNOSIS — R5383 Other fatigue: Secondary | ICD-10-CM | POA: Diagnosis not present

## 2017-04-04 DIAGNOSIS — C3492 Malignant neoplasm of unspecified part of left bronchus or lung: Secondary | ICD-10-CM

## 2017-04-04 LAB — COMPREHENSIVE METABOLIC PANEL
ALBUMIN: 3 g/dL — AB (ref 3.5–5.0)
ALK PHOS: 87 U/L (ref 40–150)
ALT: <6 U/L (ref 0–55)
ANION GAP: 8 meq/L (ref 3–11)
AST: 18 U/L (ref 5–34)
BUN: 11.4 mg/dL (ref 7.0–26.0)
CALCIUM: 10.3 mg/dL (ref 8.4–10.4)
CO2: 25 mEq/L (ref 22–29)
Chloride: 99 mEq/L (ref 98–109)
Creatinine: 0.8 mg/dL (ref 0.7–1.3)
Glucose: 97 mg/dl (ref 70–140)
POTASSIUM: 4.6 meq/L (ref 3.5–5.1)
SODIUM: 132 meq/L — AB (ref 136–145)
Total Bilirubin: 0.66 mg/dL (ref 0.20–1.20)
Total Protein: 9.1 g/dL — ABNORMAL HIGH (ref 6.4–8.3)

## 2017-04-04 LAB — CBC WITH DIFFERENTIAL/PLATELET
BASO%: 0.2 % (ref 0.0–2.0)
Basophils Absolute: 0 10*3/uL (ref 0.0–0.1)
EOS%: 0.4 % (ref 0.0–7.0)
Eosinophils Absolute: 0 10*3/uL (ref 0.0–0.5)
HEMATOCRIT: 28.8 % — AB (ref 38.4–49.9)
HGB: 9.2 g/dL — ABNORMAL LOW (ref 13.0–17.1)
LYMPH#: 0.9 10*3/uL (ref 0.9–3.3)
LYMPH%: 16.9 % (ref 14.0–49.0)
MCH: 32.6 pg (ref 27.2–33.4)
MCHC: 31.9 g/dL — ABNORMAL LOW (ref 32.0–36.0)
MCV: 102.1 fL — ABNORMAL HIGH (ref 79.3–98.0)
MONO#: 1.1 10*3/uL — AB (ref 0.1–0.9)
MONO%: 19.9 % — ABNORMAL HIGH (ref 0.0–14.0)
NEUT%: 62.6 % (ref 39.0–75.0)
NEUTROS ABS: 3.4 10*3/uL (ref 1.5–6.5)
PLATELETS: 321 10*3/uL (ref 140–400)
RBC: 2.82 10*6/uL — AB (ref 4.20–5.82)
RDW: 16.3 % — ABNORMAL HIGH (ref 11.0–14.6)
WBC: 5.4 10*3/uL (ref 4.0–10.3)

## 2017-04-04 MED ORDER — MORPHINE SULFATE ER 60 MG PO TBCR: 120.0000 mg | EXTENDED_RELEASE_TABLET | Freq: Two times a day (BID) | ORAL | 0 refills | Status: DC

## 2017-04-04 NOTE — Telephone Encounter (Signed)
Gave patient avs report and appointments for June and July.

## 2017-04-05 LAB — TSH: TSH: 1.407 m(IU)/L (ref 0.320–4.118)

## 2017-04-09 ENCOUNTER — Ambulatory Visit: Payer: Medicare Other

## 2017-04-09 ENCOUNTER — Other Ambulatory Visit: Payer: Medicare Other

## 2017-04-13 ENCOUNTER — Telehealth: Payer: Self-pay

## 2017-04-13 ENCOUNTER — Other Ambulatory Visit: Payer: Self-pay

## 2017-04-13 NOTE — Telephone Encounter (Signed)
Dr. Irene Limbo okay to refill oxycodone. Message left to inform pt that prescription would be available Monday afternoon for pick-up.

## 2017-04-16 NOTE — Progress Notes (Signed)
Joshua White  HEMATOLOGY ONCOLOGY PROGRESS NOTE  Date of service: .04/04/2017  Patient Care Team: System, Provider Not In as PCP - General  CC: f/u for metastatic Non small cell lung cancer  Diagnosis: Newly diagnosed Metastatic lung adenocarcinoma (with M1a disease - pleural nodules and malignant pleural effusion)  Current Treatment: Carboplatin + Alimta s/p 2 cycles.  HPI  Joshua White is a 66 y.o. male who has been referred to [place] by Dr Aldine Contes, MD for evaluation and management of likely metastatic lung cancer.  Patient has a history of seizures, G6PD deficiency, right bundle branch block , heavy smoking history and hepatitis C and is just about done with his 12 week treatment with Harvoni for his hepatitis C. Patient notes that he has had progressive fatigue and anorexia for the last 2-3 months. He initially assigned his symptoms to his treatment for hepatitis C with Harvoni but was concerned when the symptoms persisted. He notes he has lost about 15-20 pounds over the last couple of months. She presented to the emergency room about a week ago with significant shortness of breath for 2-3 weeks  and was noted to have a large pleural effusion and had a therapeutic thoracentesis with removal of 1 L fluid which was sent for analysis. It was noted to be exudative with cytology concerning for atypical cells which are likely malignant.  Patient presented to his primary care physician in follow-up and was noted to have significant shortness of breath again and recurrent large left-sided pleural effusion for which she was admitted. He had a CT of the chest which showed an extremely large left-sided pleural effusion occupying majority of the left hemithorax with dense consolidation of most of the left lung. Diffuse infiltrating mass along the left side of the mediastinum measuring 3.6 cm and extending into much of the pleura and left hemithorax . He was also noted to have mediastinal lymphadenopathy  and a 2.1 cm nodular opacity in the right lung . Concern for possible metastases in the epicardial fat pad . Also had mildly prominent retroperitoneal lymph nodes measuring about 8 mm which were nonspecific . CT abdomen showed no other obvious metastatic disease.  Patient had an MRI of the brain which shows no evidence of metastatic disease. 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  Patient has had a repeat therapeutic thoracentesis today cytology from this is currently pending. Cardiothoracic surgery was consulted for his recurrent large pleural effusion and there are plans for a VATS procedure with pleural biopsy and possible placement of a Pleurx catheter on 12/20/2016.  Patient notes his breathing is somewhat better after the thoracentesis. Has some left sided chest pain.  He notes that he smoked about 1-2 packs a day since age 50 and quit about a year ago. No headaches no focal neurological deficits. No abdominal pain or distention. Weight loss of about 20 pounds over the last couple months.  Pathology results showed poorly differentiated lung adenocarcinoma.  PDL1 testing 0% Other foundation One results pending  INTERVAL HISTORY:  Patient is here for follow-up after completing his 1st dose of second line treatment with Atezolizumab for toxicity check. Patient notes no acute new issues. No skin rashes no diarrhea. Notes that he is still needing a fair amount of breakthrough pain medications. His MS Contin was increased to 120 mg every 12 hours based on his usage of when necessary oxycodone. No hemoptysis. No fever chills or night sweats.   REVIEW OF  SYSTEMS:    10 Point review of systems of done and is negative except as noted above.  . Past Medical History:  Diagnosis Date  . G6PD deficiency ([place])   . H/O orthostatic hypotension   . Lung cancer ([place]) dx'd 01/2017  . Pneumonia   . RBBB (right bundle branch block)   . Seizures ([place])      . Past Surgical History:  Procedure Laterality Date  . APPENDECTOMY    . CHEST TUBE INSERTION Left 12/20/2016   Procedure: INSERTION PLEURAL DRAINAGE CATHETER;  Surgeon: Grace Isaac, MD;  Location: [facility];  Service: Thoracic;  Laterality: Left;  . PLEURAL BIOPSY Left 12/20/2016   Procedure: PLEURAL BIOPSY;  Surgeon: Grace Isaac, MD;  Location: [facility];  Service: Thoracic;  Laterality: Left;  . US ECHOCARDIOGRAPHY  05/12/2008   EF 55-60%  . VIDEO ASSISTED THORACOSCOPY (VATS) W/TALC PLEUADESIS Left 12/20/2016   Procedure: VIDEO ASSISTED THORACOSCOPY (VATS) W/TALC PLEURADESIS;  Surgeon: Grace Isaac, MD;  Location: [facility];  Service: Thoracic;  Laterality: Left;  Joshua White VIDEO BRONCHOSCOPY N/A 12/20/2016   Procedure: VIDEO BRONCHOSCOPY;  Surgeon: Grace Isaac, MD;  Location: [facility] OR;  Service: Thoracic;  Laterality: N/A;    . Social History  Substance Use Topics  . Smoking status: Former Smoker    Packs/day: 2.00    Years: 30.00    Types: Cigars  . Smokeless tobacco: Never Used     Comment: Quit ~2017  . Alcohol use No     Comment: Former use    ALLERGIES:  is allergic to tuberculin purified protein derivative and sulfa antibiotics.  MEDICATIONS:  Current Outpatient Prescriptions  Medication Sig Dispense Refill  . aspirin EC 81 MG tablet Take 81 mg by mouth daily.    Joshua White dexamethasone (DECADRON) 4 MG tablet Take 1 tablet (4 mg total) by mouth 2 (two) times daily. 10 tablet 0  . dronabinol (MARINOL) 5 MG capsule Take 1 capsule (5 mg total) by mouth 2 (two) times daily before a meal. 60 capsule 0  . folic acid (FOLVITE) 1 MG tablet Take 1 tablet (1 mg total) by mouth daily. Start 5-7 days before Alimta chemotherapy. Continue until 21 days after Alimta completed. 100 tablet 3  . morphine (MS CONTIN) 60 MG 12 hr tablet Take 2 tablets (120 mg total) by mouth every 12 (twelve) hours. 120 tablet 0  . ondansetron (ZOFRAN) 8 MG tablet Take 1 tablet (8 mg total) by mouth 2 (two)  times daily as needed for refractory nausea / vomiting. Start on day 3 after chemo. 30 tablet 1  . Oxycodone HCl 10 MG TABS Take 1 tablet (10 mg total) by mouth every 4 (four) hours as needed. 60 tablet 0  . polyethylene glycol (MIRALAX) packet Take 17 g by mouth daily. 30 each 1  . prochlorperazine (COMPAZINE) 10 MG tablet Take 1 tablet (10 mg total) by mouth every 6 (six) hours as needed (Nausea or vomiting). 30 tablet 1  . senna-docusate (SENNA S) 8.6-50 MG tablet Take 2 tablets by mouth 2 (two) times daily. 60 tablet 2   No current facility-administered medications for this visit.     PHYSICAL EXAMINATION: ECOG PERFORMANCE STATUS: 2 - Symptomatic, <50% confined to bed  . Vitals:   04/04/17 1423  BP: 130/74  Pulse: 97  Resp: 18  Temp: 98 F (36.7 C)    Filed Weights   04/04/17 1423  Weight: 137 lb (62.1 kg)   .Body mass index is  20.23 kg/m. . Wt Readings from Last 3 Encounters:  04/04/17 137 lb (62.1 kg)  03/19/17 133 lb 12.8 oz (60.7 kg)  03/14/17 137 lb (62.1 kg)    GENERAL:alert, in no acute distress and comfortable SKIN: no acute rashes, no significant lesions EYES: conjunctiva are pink and non-injected, sclera anicteric OROPHARYNX: MMM, no exudates, no oropharyngeal erythema or ulceration NECK: supple, no JVD LYMPH:  no palpable lymphadenopathy in the cervical, axillary or inguinal regions LUNGS:Decreased at entry left lung base. HEART: regular rate & rhythm ABDOMEN:  normoactive bowel sounds , non tender, not distended. Extremity: no pedal edema PSYCH: alert & oriented x 3 with fluent speech NEURO: no focal motor/sensory deficits  LABORATORY DATA:   I have reviewed the data as listed  . CBC Latest Ref Rng & Units 04/04/2017 03/19/2017 03/03/2017  WBC 4.0 - 10.3 10e3/uL 5.4 4.6 5.0  Hemoglobin 13.0 - 17.1 g/dL 9.2(L) 10.0(L) 10.4(L)  Hematocrit 38.4 - 49.9 % 28.8(L) 31.1(L) 31.5(L)  Platelets 140 - 400 10e3/uL 321 282 199    . CMP Latest Ref Rng & Units  04/04/2017 03/19/2017 03/03/2017  Glucose 70 - 140 mg/dl 97 114 116(H)  BUN 7.0 - 26.0 mg/dL 11.4 13.3 18  Creatinine 0.7 - 1.3 mg/dL 0.8 1.0 0.66  Sodium 136 - 145 mEq/L 132(L) 135(L) 130(L)  Potassium 3.5 - 5.1 mEq/L 4.6 4.9 4.8  Chloride 101 - 111 mmol/L - - 98(L)  CO2 22 - 29 mEq/L _0 Calcium 8.4 - 10.4 mg/dL 10.3 10.7(H) 9.7  Total Protein 6.4 - 8.3 g/dL 9.1(H) 9.5(H) 8.5(H)  Total Bilirubin 0.20 - 1.20 mg/dL 0.66 0.58 0.9  Alkaline Phos 40 - 150 U/L 87 89 86  AST 5 - 34 U/L _1 ALT 0-55 U/L U/L <6 11 12(L)          RADIOGRAPHIC STUDIES: I have personally reviewed the radiological images as listed and agreed with the findings in the report. No results found.  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 Patient is now status post 3 cycles of palliative carboplatin and Alimta chemotherapy which he has tolerated well. CT chest abdomen pelvis 03/13/2017 shows concern for disease progression in the left lung.  #2 Recurrent left-sided malignant pleural effusion -now s/p pleurex catheter placement and drainage. Pleurx catheter removed on outpatient follow-up with cardiothoracic surgery . No overt evidence of metastatic disease outside of the chest cavity or in the brain. Pathology showed poorly differentiated adenocarcinoma.  #3  increased dyspnea on exertion - this appears to be related to the patient's lung cancer progression. Plan -no prohibitive toxicity from first cycle of Atezolimzumab -continue palliative 2nd line treatment with Atezolizumab. -home palliative referral given   #4 ex-smoker with more than 100-pack-year history of smoking  -he has quit smoking about a year ago  #5 exalcohol abuse and remote IVDU- patient notes no recent IV drug use and has been sober from alcohol use from a  year.  #6 hepatitis C has finished treatment with harvonisoon before being diagnosed with lung cancer.  #5 moderate protein calorie malnutrition and anorexia due to malignancy and recent treatment for hepatitis C. . Wt Readings from Last 3 Encounters:  04/04/17 137 lb (62.1 kg)  03/19/17 133 lb 12.8 oz (60.7 kg)  03/14/17 137 lb (62.1 kg)   -continue on Marinol  5 mg by mouth twice a day-- patient notes some improvement in his appetite. -weight improved since last clinic visit.  #6 cancer related pain - better controlled -increased MS contin to 149m po q12h for pain and DOE/SOB  -Oxycodone every 4 hours as needed for breakthrough pain -Senna S and MiraLAX for bowel prophylaxis.   #7 Brodnax anemia- stable  #8 Thrombocytopenia - resolved platelet counts are normal today -monitor  Continue Atezolizumab as per orders RTC with Dr KIrene LimboC2D1 with labs  I spent 20 minutes counseling the patient face to face. The total time spent in the appointment was 25 minutes and more than 50% was on counseling and direct patient cares.    GSullivan LoneMD MCamptonvilleAAHIVMS SBaystate Mary Lane HospitalCKpc Promise Hospital Of Overland ParkHematology/Oncology Physician CBlack River Mem Hsptl (Office):       [phone](Work cell):  [phone](Fax):           [phone]

## 2017-04-17 ENCOUNTER — Other Ambulatory Visit (HOSPITAL_BASED_OUTPATIENT_CLINIC_OR_DEPARTMENT_OTHER): Payer: Medicare Other

## 2017-04-17 ENCOUNTER — Ambulatory Visit (HOSPITAL_BASED_OUTPATIENT_CLINIC_OR_DEPARTMENT_OTHER): Payer: Medicare Other

## 2017-04-17 ENCOUNTER — Telehealth: Payer: Self-pay | Admitting: Hematology

## 2017-04-17 ENCOUNTER — Ambulatory Visit (HOSPITAL_BASED_OUTPATIENT_CLINIC_OR_DEPARTMENT_OTHER): Payer: Medicare Other | Admitting: Hematology

## 2017-04-17 ENCOUNTER — Encounter: Payer: Self-pay | Admitting: Hematology

## 2017-04-17 VITALS — BP 109/57 | HR 99 | Temp 99.0°F | Resp 18 | Ht 69.0 in | Wt 130.3 lb

## 2017-04-17 DIAGNOSIS — Z5112 Encounter for antineoplastic immunotherapy: Secondary | ICD-10-CM

## 2017-04-17 DIAGNOSIS — E440 Moderate protein-calorie malnutrition: Secondary | ICD-10-CM

## 2017-04-17 DIAGNOSIS — C3490 Malignant neoplasm of unspecified part of unspecified bronchus or lung: Secondary | ICD-10-CM

## 2017-04-17 DIAGNOSIS — J910 Malignant pleural effusion: Secondary | ICD-10-CM

## 2017-04-17 DIAGNOSIS — D649 Anemia, unspecified: Secondary | ICD-10-CM

## 2017-04-17 DIAGNOSIS — C3492 Malignant neoplasm of unspecified part of left bronchus or lung: Secondary | ICD-10-CM

## 2017-04-17 DIAGNOSIS — C782 Secondary malignant neoplasm of pleura: Secondary | ICD-10-CM

## 2017-04-17 DIAGNOSIS — G893 Neoplasm related pain (acute) (chronic): Secondary | ICD-10-CM | POA: Diagnosis not present

## 2017-04-17 DIAGNOSIS — R630 Anorexia: Secondary | ICD-10-CM

## 2017-04-17 DIAGNOSIS — C3482 Malignant neoplasm of overlapping sites of left bronchus and lung: Secondary | ICD-10-CM

## 2017-04-17 LAB — CBC & DIFF AND RETIC
BASO%: 0.7 % (ref 0.0–2.0)
Basophils Absolute: 0 10*3/uL (ref 0.0–0.1)
EOS ABS: 0 10*3/uL (ref 0.0–0.5)
EOS%: 0.9 % (ref 0.0–7.0)
HEMATOCRIT: 28.4 % — AB (ref 38.4–49.9)
HGB: 9 g/dL — ABNORMAL LOW (ref 13.0–17.1)
IMMATURE RETIC FRACT: 12 % — AB (ref 3.00–10.60)
LYMPH%: 26.1 % (ref 14.0–49.0)
MCH: 32.1 pg (ref 27.2–33.4)
MCHC: 31.7 g/dL — ABNORMAL LOW (ref 32.0–36.0)
MCV: 101.4 fL — ABNORMAL HIGH (ref 79.3–98.0)
MONO#: 0.6 10*3/uL (ref 0.1–0.9)
MONO%: 13.6 % (ref 0.0–14.0)
NEUT#: 2.6 10*3/uL (ref 1.5–6.5)
NEUT%: 58.7 % (ref 39.0–75.0)
PLATELETS: 306 10*3/uL (ref 140–400)
RBC: 2.8 10*6/uL — ABNORMAL LOW (ref 4.20–5.82)
RDW: 15.7 % — ABNORMAL HIGH (ref 11.0–14.6)
RETIC CT ABS: 105.84 10*3/uL — AB (ref 34.80–93.90)
Retic %: 3.78 % — ABNORMAL HIGH (ref 0.80–1.80)
WBC: 4.4 10*3/uL (ref 4.0–10.3)
lymph#: 1.2 10*3/uL (ref 0.9–3.3)
nRBC: 0 % (ref 0–0)

## 2017-04-17 LAB — COMPREHENSIVE METABOLIC PANEL
ALT: 9 U/L (ref 0–55)
ANION GAP: 8 meq/L (ref 3–11)
AST: 19 U/L (ref 5–34)
Albumin: 2.9 g/dL — ABNORMAL LOW (ref 3.5–5.0)
Alkaline Phosphatase: 74 U/L (ref 40–150)
BILIRUBIN TOTAL: 0.54 mg/dL (ref 0.20–1.20)
BUN: 12.8 mg/dL (ref 7.0–26.0)
CALCIUM: 10.5 mg/dL — AB (ref 8.4–10.4)
CO2: 25 meq/L (ref 22–29)
CREATININE: 0.8 mg/dL (ref 0.7–1.3)
Chloride: 102 mEq/L (ref 98–109)
EGFR: >90 mL/min/{1.73_m2} (ref >90)
Glucose: 84 mg/dl (ref 70–140)
Potassium: 4.3 mEq/L (ref 3.5–5.1)
Sodium: 135 mEq/L — ABNORMAL LOW (ref 136–145)
TOTAL PROTEIN: 8.7 g/dL — AB (ref 6.4–8.3)

## 2017-04-17 MED ORDER — SODIUM CHLORIDE 0.9 % IV SOLN
Freq: Once | INTRAVENOUS | Status: AC
Start: 2017-04-17 — End: 2017-04-17
  Administered 2017-04-17: 13:00:00 via INTRAVENOUS

## 2017-04-17 MED ORDER — ATEZOLIZUMAB CHEMO INJECTION 1200 MG/20ML
1200.0000 mg | Freq: Once | INTRAVENOUS | Status: AC
  Administered 2017-04-17: 1200 mg via INTRAVENOUS
  Filled 2017-04-17: qty 20

## 2017-04-17 NOTE — Telephone Encounter (Signed)
Scheduled appt per 6/19 - Gave patient AVS and calender per LOS

## 2017-04-17 NOTE — Progress Notes (Signed)
Joshua White Kitchen  HEMATOLOGY ONCOLOGY PROGRESS NOTE  Date of service: .04/17/2017  Patient Care Team: System, Provider Not In as PCP - General  CC: f/u for metastatic Non small cell lung cancer  Diagnosis: Metastatic lung adenocarcinoma (with M1a disease - pleural nodules and malignant pleural effusion)  Current Treatment:   Atezolizumab q3weeks started on 03/27/2017  Previous Treatment:  Carboplatin + Alimta s/p 3 cycles.  HPI  Joshua White is a 66 y.o. male who has been referred to [place] by Dr Aldine Contes, MD for evaluation and management of likely metastatic lung cancer.  Patient has a history of seizures, G6PD deficiency, right bundle branch block , heavy smoking history and hepatitis C and is just about done with his 12 week treatment with Harvoni for his hepatitis C. Patient notes that he has had progressive fatigue and anorexia for the last 2-3 months. He initially assigned his symptoms to his treatment for hepatitis C with Harvoni but was concerned when the symptoms persisted. He notes he has lost about 15-20 pounds over the last couple of months. She presented to the emergency room about a week ago with significant shortness of breath for 2-3 weeks  and was noted to have a large pleural effusion and had a therapeutic thoracentesis with removal of 1 L fluid which was sent for analysis. It was noted to be exudative with cytology concerning for atypical cells which are likely malignant.  Patient presented to his primary care physician in follow-up and was noted to have significant shortness of breath again and recurrent large left-sided pleural effusion for which she was admitted. He had a CT of the chest which showed an extremely large left-sided pleural effusion occupying majority of the left hemithorax with dense consolidation of most of the left lung. Diffuse infiltrating mass along the left side of the mediastinum measuring 3.6 cm and extending into much of the pleura and left hemithorax  . He was also noted to have mediastinal lymphadenopathy and a 2.1 cm nodular opacity in the right lung . Concern for possible metastases in the epicardial fat pad . Also had mildly prominent retroperitoneal lymph nodes measuring about 8 mm which were nonspecific . CT abdomen showed no other obvious metastatic disease.  Patient had an MRI of the brain which shows no evidence of metastatic disease. 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  Patient has had a repeat therapeutic thoracentesis today cytology from this is currently pending. Cardiothoracic surgery was consulted for his recurrent large pleural effusion and there are plans for a VATS procedure with pleural biopsy and possible placement of a Pleurx catheter on 12/20/2016.  Patient notes his breathing is somewhat better after the thoracentesis. Has some left sided chest pain.  He notes that he smoked about 1-2 packs a day since age 59 and quit about a year ago. No headaches no focal neurological deficits. No abdominal pain or distention. Weight loss of about 20 pounds over the last couple months.  Pathology results showed poorly differentiated lung adenocarcinoma.  PDL1 testing 0% Other foundation One results pending  INTERVAL HISTORY:  Patient is here for follow-up prior to his 2nd dose of second line treatment with Atezolizumab. Patient notes no acute new issues. No skin rashes no diarrhea. He notes that his pain is much better controlled since adjustment of his pain medications on his last visit. He notes that his breathing has been better over the last 4-5 days.  No hemoptysis no significant cough.  No other prohibitive toxicities from his current treatment.   REVIEW OF SYSTEMS:    10 Point review of systems of done and is negative except as noted above.  . Past Medical History:  Diagnosis Date  . G6PD deficiency ([place])   . H/O orthostatic hypotension   . Lung cancer ([place]) dx'd  01/2017  . Pneumonia   . RBBB (right bundle branch block)   . Seizures ([place])     . Past Surgical History:  Procedure Laterality Date  . APPENDECTOMY    . CHEST TUBE INSERTION Left 12/20/2016   Procedure: INSERTION PLEURAL DRAINAGE CATHETER;  Surgeon: Grace Isaac, MD;  Location: [facility];  Service: Thoracic;  Laterality: Left;  . PLEURAL BIOPSY Left 12/20/2016   Procedure: PLEURAL BIOPSY;  Surgeon: Grace Isaac, MD;  Location: [facility];  Service: Thoracic;  Laterality: Left;  . US ECHOCARDIOGRAPHY  05/12/2008   EF 55-60%  . VIDEO ASSISTED THORACOSCOPY (VATS) W/TALC PLEUADESIS Left 12/20/2016   Procedure: VIDEO ASSISTED THORACOSCOPY (VATS) W/TALC PLEURADESIS;  Surgeon: Grace Isaac, MD;  Location: [facility];  Service: Thoracic;  Laterality: Left;  Joshua White Kitchen VIDEO BRONCHOSCOPY N/A 12/20/2016   Procedure: VIDEO BRONCHOSCOPY;  Surgeon: Grace Isaac, MD;  Location: [facility];  Service: Thoracic;  Laterality: N/A;    . Social History  Substance Use Topics  . Smoking status: Former Smoker    Packs/day: 2.00    Years: 30.00    Types: Cigars  . Smokeless tobacco: Never Used     Comment: Quit ~2017  . Alcohol use No     Comment: Former use    ALLERGIES:  is allergic to tuberculin purified protein derivative and sulfa antibiotics.  MEDICATIONS:  Current Outpatient Prescriptions  Medication Sig Dispense Refill  . aspirin EC 81 MG tablet Take 81 mg by mouth daily.    Joshua White Kitchen dexamethasone (DECADRON) 4 MG tablet Take 1 tablet (4 mg total) by mouth 2 (two) times daily. 10 tablet 0  . dronabinol (MARINOL) 5 MG capsule Take 1 capsule (5 mg total) by mouth 2 (two) times daily before a meal. 60 capsule 0  . folic acid (FOLVITE) 1 MG tablet Take 1 tablet (1 mg total) by mouth daily. Start 5-7 days before Alimta chemotherapy. Continue until 21 days after Alimta completed. 100 tablet 3  . morphine (MS CONTIN) 60 MG 12 hr tablet Take 2 tablets (120 mg total) by mouth every 12 (twelve) hours. 120 tablet 0  .  ondansetron (ZOFRAN) 8 MG tablet Take 1 tablet (8 mg total) by mouth 2 (two) times daily as needed for refractory nausea / vomiting. Start on day 3 after chemo. 30 tablet 1  . Oxycodone HCl 10 MG TABS Take 1 tablet (10 mg total) by mouth every 4 (four) hours as needed. 60 tablet 0  . polyethylene glycol (MIRALAX) packet Take 17 g by mouth daily. 30 each 1  . prochlorperazine (COMPAZINE) 10 MG tablet Take 1 tablet (10 mg total) by mouth every 6 (six) hours as needed (Nausea or vomiting). 30 tablet 1  . senna-docusate (SENNA S) 8.6-50 MG tablet Take 2 tablets by mouth 2 (two) times daily. 60 tablet 2   No current facility-administered medications for this visit.     PHYSICAL EXAMINATION: ECOG PERFORMANCE STATUS: 2 - Symptomatic, <50% confined to bed  . Vitals:   04/17/17 1208  BP: (!) 109/57  Pulse: 99  Resp: 18  Temp: 99 F (37.2 C)    Filed Weights   04/17/17  1208  Weight: 130 lb 4.8 oz (59.1 kg)   .Body mass index is 19.24 kg/m. . Wt Readings from Last 3 Encounters:  04/17/17 130 lb 4.8 oz (59.1 kg)  04/04/17 137 lb (62.1 kg)  03/19/17 133 lb 12.8 oz (60.7 kg)    GENERAL:alert, in no acute distress and comfortable SKIN: no acute rashes, no significant lesions EYES: conjunctiva are pink and non-injected, sclera anicteric OROPHARYNX: MMM, no exudates, no oropharyngeal erythema or ulceration NECK: supple, no JVD LYMPH:  no palpable lymphadenopathy in the cervical, axillary or inguinal regions LUNGS:Decreased at entry left lung base. HEART: regular rate & rhythm ABDOMEN:  normoactive bowel sounds , non tender, not distended. Extremity: no pedal edema PSYCH: alert & oriented x 3 with fluent speech NEURO: no focal motor/sensory deficits  LABORATORY DATA:   I have reviewed the data as listed  CBC Latest Ref Rng & Units 04/17/2017 04/04/2017 03/19/2017  WBC 4.0 - 10.3 10e3/uL 4.4 5.4 4.6  Hemoglobin 13.0 - 17.1 g/dL 9.0(L) 9.2(L) 10.0(L)  Hematocrit 38.4 - 49.9 % 28.4(L)  28.8(L) 31.1(L)  Platelets 140 - 400 10e3/uL 306 321 282    . CMP Latest Ref Rng & Units 04/17/2017 04/04/2017 03/19/2017  Glucose 70 - 140 mg/dl 84 97 114  BUN 7.0 - 26.0 mg/dL 12.8 11.4 13.3  Creatinine 0.7 - 1.3 mg/dL 0.8 0.8 1.0  Sodium 136 - 145 mEq/L 135(L) 132(L) 135(L)  Potassium 3.5 - 5.1 mEq/L 4.3 4.6 4.9  Chloride 101 - 111 mmol/L - - -  CO2 22 - 29 mEq/L 25 25 22   Calcium 8.4 - 10.4 mg/dL 10.5(H) 10.3 10.7(H)  Total Protein 6.4 - 8.3 g/dL 8.7(H) 9.1(H) 9.5(H)  Total Bilirubin 0.20 - 1.20 mg/dL 0.54 0.66 0.58  Alkaline Phos 40 - 150 U/L 74 87 89  AST 5 - 34 U/L 19 18 21   ALT 0 - 55 U/L 9 <6 11           RADIOGRAPHIC STUDIES: I have personally reviewed the radiological images as listed and agreed with the findings in the report. No results found.  ASSESSMENT & PLAN:    66 year old male with  #1 Metastatic lung adenocarcinoma (with M1a disease - pleural nodules and malignant pleural effusion) PDL1 0%  Negative for EGFR, ALK, ROS1 and BRAF mutations. Patient is now status post 3 cycles of palliative carboplatin and Alimta chemotherapy which he has tolerated well. CT chest abdomen pelvis 03/13/2017 shows concern for disease progression in the left lung. Patient tolerated the 1st cycle of Atezolizumab well without any acute concerns.  #2 Recurrent left-sided malignant pleural effusion -now s/p pleurex catheter placement and drainage. Pleurx catheter removed on outpatient follow-up with cardiothoracic surgery . No overt evidence of metastatic disease outside of the chest cavity or in the brain. Pathology showed poorly differentiated adenocarcinoma.  Plan -Patient notes his pain is well controlled today and his breathing is somewhat better. Labs are stable -no prohibitive toxicity from first cycle of Atezolimzumab -continue palliative 2nd line treatment with Atezolizumab -he is due for his second cycle today. -home palliative referral is in place. --continue  Atezolizumab as per plan. Schedule next 2 cycles -labs with each treatment -f/u with NP/Dr Lebron Conners with next cycle of treatment C3D1 -CT chest/abd/pelvis in 5 weeks -RTC with Dr Irene Limbo with labs in 6weeks with C4D1 of treatment with labs and CT chest/abd/pelvis  #4 ex-smoker with more than 100-pack-year history of smoking  -he has quit smoking about a year ago.  #5 exalcohol abuse  and remote IVDU- patient notes no recent IV drug use and has been sober from alcohol use from a year.  #6 hepatitis C has finished treatment with harvonisoon before being diagnosed with lung cancer.  #5 moderate protein calorie malnutrition and anorexia due to malignancy and recent treatment for hepatitis C. . Wt Readings from Last 3 Encounters:  04/17/17 130 lb 4.8 oz (59.1 kg)  04/04/17 137 lb (62.1 kg)  03/19/17 133 lb 12.8 oz (60.7 kg)   -continue on Marinol 5 mg by mouth twice a day -Patient encouraged to increase his by mouth food intake. No specific limiting factor is no nausea  #6 cancer related pain - better controlled -continue MS contin to 163m po q12h for pain and DOE/SOB. -Oxycodone every 4 hours as needed for breakthrough pain -Senna S and MiraLAX for bowel prophylaxis.   #7 [place] anemia- stable -transfuse prn for symptomatic anemia  #8 Thrombocytopenia - resolved platelet counts are normal today -monitor  -continue Atezolizumab as per plan. Schedule next 2 cycles -labs with each treatment -f/u with NP/Dr PLebron Connerswith next cycle of treatment C3D1 -CT chest/abd/pelvis in 5 weeks -RTC with Dr KIrene Limbowith labs in 6weeks with C4D1 of treatment with labs and CT chest/abd/pelvis  I spent 20 minutes counseling the patient face to face. The total time spent in the appointment was 30 minutes and more than 50% was on counseling and direct patient cares.    GSullivan LoneMD MNorth TustinAAHIVMS STanner Medical Center/East AlabamaCMemorial Hospital Of Sweetwater CountyHematology/Oncology Physician CTallahassee Endoscopy Center (Office):       [phone](Work cell):   [phone](Fax):           [phone]

## 2017-04-17 NOTE — Patient Instructions (Signed)
[facility] Discharge Instructions for Patients Receiving Chemotherapy  Today you received the following chemotherapy agents Tecentriq  To help prevent nausea and vomiting after your treatment, we encourage you to take your nausea medication   If you develop nausea and vomiting that is not controlled by your nausea medication, call the clinic.   BELOW ARE SYMPTOMS THAT SHOULD BE REPORTED IMMEDIATELY:  *FEVER GREATER THAN 100.5 F  *CHILLS WITH OR WITHOUT FEVER  NAUSEA AND VOMITING THAT IS NOT CONTROLLED WITH YOUR NAUSEA MEDICATION  *UNUSUAL SHORTNESS OF BREATH  *UNUSUAL BRUISING OR BLEEDING  TENDERNESS IN MOUTH AND THROAT WITH OR WITHOUT PRESENCE OF ULCERS  *URINARY PROBLEMS  *BOWEL PROBLEMS  UNUSUAL RASH Items with * indicate a potential emergency and should be followed up as soon as possible.  Feel free to call the clinic you have any questions or concerns. The clinic phone number is [phone].  Please show the [place] at check-in to the Emergency Department and triage nurse.

## 2017-04-17 NOTE — Patient Instructions (Signed)
Thank you for choosing [facility] to provide your oncology and hematology care.  To afford each patient quality time with our providers, please arrive 30 minutes before your scheduled appointment time.  If you arrive late for your appointment, you may be asked to reschedule.  We strive to give you quality time with our providers, and arriving late affects you and other patients whose appointments are after yours.  If you are a no show for multiple scheduled visits, you may be dismissed from the clinic at the providers discretion.   Again, thank you for choosing [facility], our hope is that these requests will decrease the amount of time that you wait before being seen by our physicians.  ______________________________________________________________________ Should you have questions after your visit to the  Cancer Center, please contact our office at [phone] between the hours of 8:30 and 4:30 p.m.    Voicemails left after 4:30p.m will not be returned until the following business day.   For prescription refill requests, please have your pharmacy contact us directly.  Please also try to allow 48 hours for prescription requests.   Please contact the scheduling department for questions regarding scheduling.  For scheduling of procedures such as PET scans, CT scans, MRI, Ultrasound, etc please contact central scheduling at [phone].   Resources For Cancer Patients and Caregivers:  American Cancer Society:  800-227-2345  Can help patients locate various types of support and financial assistance Cancer Care: 1-800-813-HOPE (4673) Provides financial assistance, online support groups, medication/co-pay assistance.   [place] DSS:  336-641-3447 Where to apply for food stamps, Medicaid, and utility assistance Medicare Rights Center: 800-333-4114 Helps people with Medicare understand their rights and benefits, navigate the Medicare system, and secure the  quality healthcare they deserve SCAT: 336-333-6589 [employer]'s shared-ride transportation service for eligible riders who have a disability that prevents them from riding the fixed route bus.   For additional information on assistance programs please contact our social worker:   Grier Hock/Abigail Elmore:  [phone] 

## 2017-04-18 ENCOUNTER — Telehealth: Payer: Self-pay | Admitting: *Deleted

## 2017-04-18 ENCOUNTER — Encounter: Payer: Self-pay | Admitting: Hematology

## 2017-04-18 ENCOUNTER — Other Ambulatory Visit: Payer: Self-pay | Admitting: *Deleted

## 2017-04-18 MED ORDER — MORPHINE SULFATE ER 60 MG PO TBCR: 120.0000 mg | EXTENDED_RELEASE_TABLET | Freq: Two times a day (BID) | ORAL | 0 refills | Status: DC

## 2017-04-18 MED ORDER — OXYCODONE HCL 10 MG PO TABS: 10.0000 mg | ORAL_TABLET | ORAL | 0 refills | Status: DC | PRN

## 2017-04-18 NOTE — Telephone Encounter (Signed)
Received fax for PA on morphine RX.  PA form placed in box in managed care 04/18/17 @ 1110am.

## 2017-04-18 NOTE — Progress Notes (Signed)
Received PA request for Morphine from [facility].  Called [facility]([place]) which was listed and was given number for [employer].  Called [employer](Pam) to initiate PA. She asked clinical questions and then connected me to the pharmacist(Selena) whom asked additional questions.  Selena approved PA for Morphine for one year and will send approval confirmation via fax.  Called [facility]([place]) to advise of approval. She states ok and they will get it ready.

## 2017-04-26 ENCOUNTER — Encounter: Payer: Medicare Other | Admitting: Cardiothoracic Surgery

## 2017-04-27 ENCOUNTER — Ambulatory Visit (INDEPENDENT_AMBULATORY_CARE_PROVIDER_SITE_OTHER): Payer: Medicare Other | Admitting: Cardiothoracic Surgery

## 2017-04-27 ENCOUNTER — Encounter: Payer: Self-pay | Admitting: Cardiothoracic Surgery

## 2017-04-27 ENCOUNTER — Ambulatory Visit
Admission: RE | Admit: 2017-04-27 | Discharge: 2017-04-27 | Disposition: A | Discharge status: Home / Self Care | Payer: Medicare Other | Source: Ambulatory Visit | Attending: Cardiothoracic Surgery | Admitting: Cardiothoracic Surgery

## 2017-04-27 VITALS — BP 118/67 | HR 90 | Resp 20 | Ht 69.0 in | Wt 128.0 lb

## 2017-04-27 DIAGNOSIS — C3490 Malignant neoplasm of unspecified part of unspecified bronchus or lung: Secondary | ICD-10-CM

## 2017-04-27 DIAGNOSIS — Z09 Encounter for follow-up examination after completed treatment for conditions other than malignant neoplasm: Secondary | ICD-10-CM | POA: Diagnosis not present

## 2017-04-27 DIAGNOSIS — J90 Pleural effusion, not elsewhere classified: Secondary | ICD-10-CM | POA: Diagnosis not present

## 2017-04-27 DIAGNOSIS — C3492 Malignant neoplasm of unspecified part of left bronchus or lung: Secondary | ICD-10-CM

## 2017-04-27 NOTE — Progress Notes (Signed)
[address]       [place] [address]             [phone]      Joshua White [facility] Medical Record #[id number] Date of Birth: May 17, 1951  Referring: Ophelia Shoulder, MD Primary Care: System, Provider Not In  Chief Complaint:   POST OP FOLLOW UP 12/20/2016 OPERATIVE REPORT  PREOPERATIVE DIAGNOSIS: Large left pleural effusion. POSTOPERATIVE DIAGNOSIS: Large left pleural effusion with malignant pleural effusion. PROCEDURES PERFORMED: Bronchoscopy, left video-assisted thoracoscopy, pleural biopsy, drainage of effusion, sterile talc pleurodesis, and placement of PleurX catheter.  SURGEON: Lanelle Bal History of Present Illness:     Patient with known metastatic adenocarcinoma the lung with pleural nodules in the malignant pleural effusion drained in February with talc pleurodesis. The patient continues on therapy. His respiratory status has been stable. He started on immune therapy several weeks ago.  Current Treatment:   Atezolizumab q3weeks started on 03/27/2017  Previous Treatment:  Carboplatin + Alimta s/p 3 cycles.   Past Medical History:  Diagnosis Date  . G6PD deficiency ([place])   . H/O orthostatic hypotension   . Lung cancer ([place]) dx'd 01/2017  . Pneumonia   . RBBB (right bundle branch block)   . Seizures ([place])      History  Smoking Status  . Former Smoker  . Packs/day: 2.00  . Years: 30.00  . Types: Cigars  Smokeless Tobacco  . Never Used    Comment: Quit ~2017    History  Alcohol Use No    Comment: Former use     Allergies  Allergen Reactions  . Tuberculin Purified Protein Derivative Swelling and Other (See Comments)    Reaction:  Unspecified swelling reaction   . Sulfa Antibiotics Other (See Comments)    Reaction:  Causes pt to pass out     Current Outpatient Prescriptions  Medication Sig Dispense Refill  . aspirin EC 81 MG tablet Take 81 mg by mouth daily.    [name] dexamethasone (DECADRON) 4 MG  tablet Take 1 tablet (4 mg total) by mouth 2 (two) times daily. 10 tablet 0  . dronabinol (MARINOL) 5 MG capsule Take 1 capsule (5 mg total) by mouth 2 (two) times daily before a meal. 60 capsule 0  . folic acid (FOLVITE) 1 MG tablet Take 1 tablet (1 mg total) by mouth daily. Start 5-7 days before Alimta chemotherapy. Continue until 21 days after Alimta completed. 100 tablet 3  . morphine (MS CONTIN) 60 MG 12 hr tablet Take 2 tablets (120 mg total) by mouth every 12 (twelve) hours. 90 tablet 0  . ondansetron (ZOFRAN) 8 MG tablet Take 1 tablet (8 mg total) by mouth 2 (two) times daily as needed for refractory nausea / vomiting. Start on day 3 after chemo. 30 tablet 1  . Oxycodone HCl 10 MG TABS Take 1 tablet (10 mg total) by mouth every 4 (four) hours as needed. 60 tablet 0  . polyethylene glycol (MIRALAX) packet Take 17 g by mouth daily. 30 each 1  . prochlorperazine (COMPAZINE) 10 MG tablet Take 1 tablet (10 mg total) by mouth every 6 (six) hours as needed (Nausea or vomiting). 30 tablet 1  . senna-docusate (SENNA S) 8.6-50 MG tablet Take 2 tablets by mouth 2 (two) times daily. 60 tablet 2   No current facility-administered medications for this visit.        Physical Exam: BP 118/67   Pulse 90   Resp 20  Ht 5\' 9"  (1.753 m)   Wt 128 lb (58.1 kg)   SpO2 97% Comment: RA  BMI 18.90 kg/m   General appearance: alert and cooperative Neurologic: intact Heart: regular rate and rhythm, S1, S2 normal, no murmur, click, rub or gallop Lungs: diminished breath sounds LLL Abdomen: soft, non-tender; bowel sounds normal; no masses,  no organomegaly Extremities: extremities normal, atraumatic, no cyanosis or edema and Homans sign is negative, no sign of DVT Wound: Chest tube sites are well-healed   Diagnostic Studies & Laboratory data:     Recent Radiology Findings:   Dg Chest 2 View  Result Date: 04/27/2017 CLINICAL DATA:  Concern.  Right-sided chest pain. EXAM: CHEST  2 VIEW COMPARISON:   Chest CT 03/13/2017 FINDINGS: Persistent left upper lobe lung mass with a large complex loculated pleural fluid collection and pleural tumor. The right lung is clear. No pleural effusion on the right. The bony thorax is intact. IMPRESSION: Persistent complex left pleural fluid collection, pleural tumor and lung mass. The right lung remains clear. Electronically Signed   By: Marijo Sanes M.D.   On: 04/27/2017 12:03      Recent Lab Findings: Lab Results  Component Value Date   WBC 4.4 04/17/2017   HGB 9.0 (L) 04/17/2017   HCT 28.4 (L) 04/17/2017   PLT 306 04/17/2017   GLUCOSE 84 04/17/2017   CHOL  02/23/2011    130        ATP III CLASSIFICATION:  <200     mg/dL   Desirable  200-239  mg/dL   Borderline High  >=240    mg/dL   High          TRIG 123 02/23/2011   HDL 26 (L) 02/23/2011   LDLCALC  02/23/2011    79        Total Cholesterol/HDL:CHD Risk Coronary Heart Disease Risk Table                     Men   Women  1/2 Average Risk   3.4   3.3  Average Risk       5.0   4.4  2 X Average Risk   9.6   7.1  3 X Average Risk  23.4   11.0        Use the calculated Patient Ratio above and the CHD Risk Table to determine the patient's CHD Risk.        ATP III CLASSIFICATION (LDL):  <100     mg/dL   Optimal  100-129  mg/dL   Near or Above                    Optimal  130-159  mg/dL   Borderline  160-189  mg/dL   High  >190     mg/dL   Very High   ALT 9 04/17/2017   AST 19 04/17/2017   NA 135 (L) 04/17/2017   K 4.3 04/17/2017   CL 98 (L) 03/03/2017   CREATININE 0.8 04/17/2017   BUN 12.8 04/17/2017   CO2 25 04/17/2017   TSH 1.407 04/04/2017   INR 1.16 12/19/2016   HGBA1C 4.9 12/22/2016      Assessment / Plan:     Patient stable from a thoracic surgery standpoint, continues to undergo treatment for metastatic adenocarcinoma the lung. Currently chest x-ray and recent CT scan did not indicate any need for further drainage or surgical intervention. I'll plan to see the patient  back as  needed at oncology's request.    Grace Isaac MD      [address] [place] [address] Office [phone]   Beeper [phone]  04/27/2017 1:03 PM

## 2017-04-30 ENCOUNTER — Other Ambulatory Visit: Payer: Medicare Other

## 2017-04-30 ENCOUNTER — Ambulatory Visit: Payer: Medicare Other

## 2017-05-04 ENCOUNTER — Other Ambulatory Visit: Payer: Self-pay | Admitting: Hematology

## 2017-05-08 ENCOUNTER — Ambulatory Visit (HOSPITAL_BASED_OUTPATIENT_CLINIC_OR_DEPARTMENT_OTHER): Payer: Medicare Other

## 2017-05-08 ENCOUNTER — Encounter: Payer: Self-pay | Admitting: Hematology and Oncology

## 2017-05-08 ENCOUNTER — Other Ambulatory Visit: Payer: Self-pay

## 2017-05-08 ENCOUNTER — Ambulatory Visit (HOSPITAL_BASED_OUTPATIENT_CLINIC_OR_DEPARTMENT_OTHER): Payer: Medicare Other | Admitting: Hematology and Oncology

## 2017-05-08 ENCOUNTER — Ambulatory Visit: Payer: Medicare Other

## 2017-05-08 ENCOUNTER — Other Ambulatory Visit (HOSPITAL_BASED_OUTPATIENT_CLINIC_OR_DEPARTMENT_OTHER): Payer: Medicare Other

## 2017-05-08 VITALS — BP 136/79 | HR 88 | Temp 98.5°F | Resp 17 | Ht 69.0 in | Wt 128.7 lb

## 2017-05-08 DIAGNOSIS — Z5112 Encounter for antineoplastic immunotherapy: Secondary | ICD-10-CM

## 2017-05-08 DIAGNOSIS — G893 Neoplasm related pain (acute) (chronic): Secondary | ICD-10-CM | POA: Diagnosis not present

## 2017-05-08 DIAGNOSIS — R64 Cachexia: Secondary | ICD-10-CM

## 2017-05-08 DIAGNOSIS — R630 Anorexia: Secondary | ICD-10-CM

## 2017-05-08 DIAGNOSIS — C3492 Malignant neoplasm of unspecified part of left bronchus or lung: Secondary | ICD-10-CM

## 2017-05-08 LAB — CBC WITH DIFFERENTIAL/PLATELET
BASO%: 0.6 % (ref 0.0–2.0)
BASOS ABS: 0 10*3/uL (ref 0.0–0.1)
EOS%: 0.3 % (ref 0.0–7.0)
Eosinophils Absolute: 0 10*3/uL (ref 0.0–0.5)
HCT: 32 % — ABNORMAL LOW (ref 38.4–49.9)
HGB: 10.6 g/dL — ABNORMAL LOW (ref 13.0–17.1)
LYMPH%: 12.3 % — ABNORMAL LOW (ref 14.0–49.0)
MCH: 31.8 pg (ref 27.2–33.4)
MCHC: 33 g/dL (ref 32.0–36.0)
MCV: 96.2 fL (ref 79.3–98.0)
MONO#: 0.3 10*3/uL (ref 0.1–0.9)
MONO%: 4.8 % (ref 0.0–14.0)
NEUT#: 4.3 10*3/uL (ref 1.5–6.5)
NEUT%: 82 % — AB (ref 39.0–75.0)
Platelets: 307 10*3/uL (ref 140–400)
RBC: 3.33 10*6/uL — ABNORMAL LOW (ref 4.20–5.82)
RDW: 16.3 % — AB (ref 11.0–14.6)
WBC: 5.3 10*3/uL (ref 4.0–10.3)
lymph#: 0.7 10*3/uL — ABNORMAL LOW (ref 0.9–3.3)

## 2017-05-08 LAB — COMPREHENSIVE METABOLIC PANEL
ALT: 19 U/L (ref 0–55)
AST: 44 U/L — AB (ref 5–34)
Albumin: 2.6 g/dL — ABNORMAL LOW (ref 3.5–5.0)
Alkaline Phosphatase: 77 U/L (ref 40–150)
Anion Gap: 7 mEq/L (ref 3–11)
BUN: 12.5 mg/dL (ref 7.0–26.0)
CHLORIDE: 98 meq/L (ref 98–109)
CO2: 26 meq/L (ref 22–29)
Calcium: 11.4 mg/dL — ABNORMAL HIGH (ref 8.4–10.4)
Creatinine: 0.8 mg/dL (ref 0.7–1.3)
EGFR: >90 mL/min/{1.73_m2} (ref >90)
Glucose: 104 mg/dl (ref 70–140)
POTASSIUM: 4.6 meq/L (ref 3.5–5.1)
SODIUM: 132 meq/L — AB (ref 136–145)
Total Bilirubin: 0.58 mg/dL (ref 0.20–1.20)
Total Protein: 8.7 g/dL — ABNORMAL HIGH (ref 6.4–8.3)

## 2017-05-08 MED ORDER — OXYCODONE HCL 10 MG PO TABS: 10.0000 mg | ORAL_TABLET | ORAL | 0 refills | Status: DC | PRN

## 2017-05-08 MED ORDER — SODIUM CHLORIDE 0.9 % IV SOLN
1200.0000 mg | Freq: Once | INTRAVENOUS | Status: AC
  Administered 2017-05-08: 1200 mg via INTRAVENOUS
  Filled 2017-05-08: qty 20

## 2017-05-08 MED ORDER — SODIUM CHLORIDE 0.9 % IV SOLN
1000.0000 mL | Freq: Once | INTRAVENOUS | Status: AC
  Administered 2017-05-08: 13:00:00 via INTRAVENOUS

## 2017-05-08 MED ORDER — SODIUM CHLORIDE 0.9 % IV SOLN
Freq: Once | INTRAVENOUS | Status: AC
  Administered 2017-05-08: 13:00:00 via INTRAVENOUS

## 2017-05-08 MED ORDER — DRONABINOL 5 MG PO CAPS: 5.0000 mg | ORAL_CAPSULE | Freq: Two times a day (BID) | ORAL | 0 refills | Status: AC

## 2017-05-08 MED ORDER — ZOLEDRONIC ACID 4 MG/100ML IV SOLN
4.0000 mg | Freq: Once | INTRAVENOUS | Status: AC
  Administered 2017-05-08: 4 mg via INTRAVENOUS
  Filled 2017-05-08: qty 100

## 2017-05-08 NOTE — Patient Instructions (Signed)
Atezolizumab injection What is this medicine? ATEZOLIZUMAB (a te zoe LIZ ue mab) is a monoclonal antibody. It is used to treat bladder cancer (urothelial cancer) and non-small cell lung cancer. This medicine may be used for other purposes; ask your health care provider or pharmacist if you have questions. COMMON BRAND NAME(S): Tecentriq What should I tell my health care provider before I take this medicine? They need to know if you have any of these conditions: -diabetes -immune system problems -infection -inflammatory bowel disease -liver disease -lung or breathing disease -lupus -nervous system problems like myasthenia gravis or Guillain-Barre syndrome -organ transplant -an unusual or allergic reaction to atezolizumab, other medicines, foods, dyes, or preservatives -pregnant or trying to get pregnant -breast-feeding How should I use this medicine? This medicine is for infusion into a vein. It is given by a health care professional in a hospital or clinic setting. A special MedGuide will be given to you before each treatment. Be sure to read this information carefully each time. Talk to your pediatrician regarding the use of this medicine in children. Special care may be needed. Overdosage: If you think you have taken too much of this medicine contact a poison control center or emergency room at once. NOTE: This medicine is only for you. Do not share this medicine with others. What if I miss a dose? It is important not to miss your dose. Call your doctor or health care professional if you are unable to keep an appointment. What may interact with this medicine? Interactions have not been studied. This list may not describe all possible interactions. Give your health care provider a list of all the medicines, herbs, non-prescription drugs, or dietary supplements you use. Also tell them if you smoke, drink alcohol, or use illegal drugs. Some items may interact with your medicine. What  should I watch for while using this medicine? Your condition will be monitored carefully while you are receiving this medicine. You may need blood work done while you are taking this medicine. Do not become pregnant while taking this medicine or for at least 5 months after stopping it. Women should inform their doctor if they wish to become pregnant or think they might be pregnant. There is a potential for serious side effects to an unborn child. Talk to your health care professional or pharmacist for more information. Do not breast-feed an infant while taking this medicine or for at least 5 months after the last dose. What side effects may I notice from receiving this medicine? Side effects that you should report to your doctor or health care professional as soon as possible: -allergic reactions like skin rash, itching or hives, swelling of the face, lips, or tongue -black, tarry stools -bloody or watery diarrhea -breathing problems -changes in vision -chest pain or chest tightness -chills -facial flushing -fever -headache -signs and symptoms of high blood sugar such as dizziness; dry mouth; dry skin; fruity breath; nausea; stomach pain; increased hunger or thirst; increased urination -signs and symptoms of liver injury like dark yellow or brown urine; general ill feeling or flu-like symptoms; light-colored stools; loss of appetite; nausea; right upper belly pain; unusually weak or tired; yellowing of the eyes or skin -stomach pain -trouble passing urine or change in the amount of urine Side effects that usually do not require medical attention (report to your doctor or health care professional if they continue or are bothersome): -cough -diarrhea -joint pain -muscle pain -muscle weakness -tiredness -weight loss This list may not describe all  possible side effects. Call your doctor for medical advice about side effects. You may report side effects to FDA at 1-800-FDA-1088. Where should  I keep my medicine? This drug is given in a hospital or clinic and will not be stored at home. NOTE: This sheet is a summary. It may not cover all possible information. If you have questions about this medicine, talk to your doctor, pharmacist, or health care provider.  2018 Elsevier/Gold Standard (2015-11-17 17:54:14) Zoledronic Acid injection (Hypercalcemia, Oncology) What is this medicine? ZOLEDRONIC ACID (ZOE le dron ik AS id) lowers the amount of calcium loss from bone. It is used to treat too much calcium in your blood from cancer. It is also used to prevent complications of cancer that has spread to the bone. This medicine may be used for other purposes; ask your health care provider or pharmacist if you have questions. COMMON BRAND NAME(S): Zometa What should I tell my health care provider before I take this medicine? They need to know if you have any of these conditions: -aspirin-sensitive asthma -cancer, especially if you are receiving medicines used to treat cancer -dental disease or wear dentures -infection -kidney disease -receiving corticosteroids like dexamethasone or prednisone -an unusual or allergic reaction to zoledronic acid, other medicines, foods, dyes, or preservatives -pregnant or trying to get pregnant -breast-feeding How should I use this medicine? This medicine is for infusion into a vein. It is given by a health care professional in a hospital or clinic setting. Talk to your pediatrician regarding the use of this medicine in children. Special care may be needed. Overdosage: If you think you have taken too much of this medicine contact a poison control center or emergency room at once. NOTE: This medicine is only for you. Do not share this medicine with others. What if I miss a dose? It is important not to miss your dose. Call your doctor or health care professional if you are unable to keep an appointment. What may interact with this medicine? -certain  antibiotics given by injection -NSAIDs, medicines for pain and inflammation, like ibuprofen or naproxen -some diuretics like bumetanide, furosemide -teriparatide -thalidomide This list may not describe all possible interactions. Give your health care provider a list of all the medicines, herbs, non-prescription drugs, or dietary supplements you use. Also tell them if you smoke, drink alcohol, or use illegal drugs. Some items may interact with your medicine. What should I watch for while using this medicine? Visit your doctor or health care professional for regular checkups. It may be some time before you see the benefit from this medicine. Do not stop taking your medicine unless your doctor tells you to. Your doctor may order blood tests or other tests to see how you are doing. Women should inform their doctor if they wish to become pregnant or think they might be pregnant. There is a potential for serious side effects to an unborn child. Talk to your health care professional or pharmacist for more information. You should make sure that you get enough calcium and vitamin D while you are taking this medicine. Discuss the foods you eat and the vitamins you take with your health care professional. Some people who take this medicine have severe bone, joint, and/or muscle pain. This medicine may also increase your risk for jaw problems or a broken thigh bone. Tell your doctor right away if you have severe pain in your jaw, bones, joints, or muscles. Tell your doctor if you have any pain that does not  go away or that gets worse. Tell your dentist and dental surgeon that you are taking this medicine. You should not have major dental surgery while on this medicine. See your dentist to have a dental exam and fix any dental problems before starting this medicine. Take good care of your teeth while on this medicine. Make sure you see your dentist for regular follow-up appointments. What side effects may I notice  from receiving this medicine? Side effects that you should report to your doctor or health care professional as soon as possible: -allergic reactions like skin rash, itching or hives, swelling of the face, lips, or tongue -anxiety, confusion, or depression -breathing problems -changes in vision -eye pain -feeling faint or lightheaded, falls -jaw pain, especially after dental work -mouth sores -muscle cramps, stiffness, or weakness -redness, blistering, peeling or loosening of the skin, including inside the mouth -trouble passing urine or change in the amount of urine Side effects that usually do not require medical attention (report to your doctor or health care professional if they continue or are bothersome): -bone, joint, or muscle pain -constipation -diarrhea -fever -hair loss -irritation at site where injected -loss of appetite -nausea, vomiting -stomach upset -trouble sleeping -trouble swallowing -weak or tired This list may not describe all possible side effects. Call your doctor for medical advice about side effects. You may report side effects to FDA at 1-800-FDA-1088. Where should I keep my medicine? This drug is given in a hospital or clinic and will not be stored at home. NOTE: This sheet is a summary. It may not cover all possible information. If you have questions about this medicine, talk to your doctor, pharmacist, or health care provider.  2018 Elsevier/Gold Standard (2014-03-14 14:19:39)

## 2017-05-08 NOTE — Progress Notes (Signed)
Dr. Lebron Conners okay to tx with tecentriq despite Ca 11.4. Pt to additionally receive 1L IVF and zometa.

## 2017-05-08 NOTE — Assessment & Plan Note (Signed)
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 Tolerating treatment reasonably well at this time. Symptomatically, patient appears to have improved compared to the baseline prior to this treatment.  His current issues include the above-mentioned malignancy, cancer-related pain, cancer-related cachexia, and hypercalcemia noted on examination of the lab work today.  **NSCLC: Clinically, patient appears to be improving in terms of his respiratory status. I do not see any definitive treatment-related toxicities at this point in time although patient is having a grade 1 elevation of AST which may be an early sign of hepatotoxicity from atezolizumab. At this time, level of the AST is low enough to continue monitoring it as it may be related to other processes. --Proceed with Cycle #3 of atezolizumab --CT C/A/P prior to cycle #4  **Cancer-related pain: Patient is currently receiving cage old MS Contin with an oxycodone for his cancer--related pain. It appears that patient is trying to treat his pain to level 0. We had an extensive conversation regarding the operative goals of treatment for the pain control such as pain level that may not necessarily be 0, but does allow for better functioning. Concerning the patient is overusing his short-acting medication at this time. This may be counterproductive as it is likely contributing to his constipation and crease activity level. 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 Appears that the patient responded well to his dronabinol treatment and his appetite has improved substantially. --Renewed dronabinol prescription  **Hypercalcemia: Mild hypercalcemia with calcium of 11.4 today. Adjust calcium level is higher based on mild hypoalbuminemia. This may be contributing to constipation and  slight alteration of patient mentation reported by the family. The calcium elevation is likely due to patient's decreased activity level, dehydration which is supported by concurrent hyponatremia. Patient has no history of skeletal by his malignancy so far. He has no complaints to suggest until skeletal structures either. --NS x1L today --Zoledronic acid 4mg  IV x1 --Repeat CMP next week and possible give additional IVF  **RTC in Clinic with Dr Irene Limbo: --CT C/A/P prior to [facility] visit in 3 weeks with labs, imaging review, and possible continuation of palliative immunotherapy.  Voice recognition software was used and creation of this note. Despite my best effort at editing the text, some misspelling/errors may have occurred.

## 2017-05-08 NOTE — Progress Notes (Signed)
[name]:  Assessment: Adenocarcinoma of left lung, stage 43 ([name]) 66 year old male with stage IV adenocarcinoma of the lung undergoing palliative systemic immunotherapy with atezolizumab. Tolerating treatment reasonably well at this time. Symptomatically, patient appears to have improved compared to the baseline prior to this treatment.  His current issues include the above-mentioned malignancy, cancer-related pain, cancer-related cachexia, and hypercalcemia noted on examination of the lab work today.  **NSCLC: Clinically, patient appears to be improving in terms of his respiratory status. I do not see any definitive treatment-related toxicities at this point in time although patient is having a grade 1 elevation of AST which may be an early sign of hepatotoxicity from atezolizumab. At this time, level of the AST is low enough to continue monitoring it as it may be related to other processes. --Proceed with Cycle #3 of atezolizumab --CT C/A/P prior to cycle #4  **Cancer-related pain: Patient is currently receiving cage old MS Contin with an oxycodone for his cancer--related pain. It appears that patient is trying to treat his pain to level 0. We had an extensive conversation regarding the operative goals of treatment for the pain control such as pain level that may not necessarily be 0, but does allow for better functioning. Concerning the patient is overusing his short-acting medication at this time. This may be counterproductive as it is likely contributing to his constipation and crease activity level. 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 Appears that the patient responded well to his dronabinol treatment and his appetite has improved substantially. --Renewed dronabinol prescription  **Hypercalcemia: Mild hypercalcemia with calcium of 11.4  today. Adjust calcium level is higher based on mild hypoalbuminemia. This may be contributing to constipation and slight alteration of patient mentation reported by the family. The calcium elevation is likely due to patient's decreased activity level, dehydration which is supported by concurrent hyponatremia. Patient has no history of skeletal by his malignancy so far. He has no complaints to suggest until skeletal structures either. --NS x1L today --Zoledronic acid 67m IV x1 --Repeat CMP next week and possible give additional IVF  **RTC in Clinic with Dr KIrene Limbo --CT C/A/[facility] with labs, imaging review, and possible continuation of palliative immunotherapy.  Voice recognition software was used and creation of this note. Despite my best effort at editing the text, some misspelling/errors may have occurred.   Orders Placed This Encounter  Procedures  . CT Chest W Contrast    Standing Status:   Future    Standing Expiration Date:   05/08/2018    Order Specific Question:   If indicated for the ordered procedure, I authorize the administration of contrast media per Radiology protocol    Answer:   Yes    Order Specific Question:   Reason for Exam (SYMPTOM  OR DIAGNOSIS REQUIRED)    Answer:   Stage IV NSCLC, undergoing immunotherapy, please eval for response    Order Specific Question:   [facility]    [facility]   Order Specific Question:   Radiology Contrast Protocol - do NOT remove file path    Answer:   \\[email or web address]  . CT Abdomen Pelvis W Contrast    Standing Status:   Future    Standing Expiration Date:   05/08/2018    Order Specific Question:   If indicated for the ordered procedure,  I authorize the administration of contrast media per Radiology protocol    Answer:   Yes    Order Specific Question:   Reason for Exam (SYMPTOM  OR DIAGNOSIS REQUIRED)    Answer:   Stage IV NSCLC, undergoing  immunotherapy, please eval for response    Order Specific Question:   Preferred imaging location?    Answer:   [facility]    Order Specific Question:   Radiology Contrast Protocol - do NOT remove file path    Answer:   \\[email or web address]\CTProtocols.pdf  . CBC with Differential    Standing Status:   Future    Number of Occurrences:   1    Standing Expiration Date:   05/08/2018  . Comprehensive metabolic panel    Standing Status:   Future    Number of Occurrences:   1    Standing Expiration Date:   05/08/2018  . CBC with Differential    Standing Status:   Future    Standing Expiration Date:   05/08/2018  . Comprehensive metabolic panel    Standing Status:   Future    Standing Expiration Date:   05/08/2018  . Magnesium    Standing Status:   Future    Standing Expiration Date:   05/08/2018  . Phosphorus    Standing Status:   Future    Standing Expiration Date:   05/08/2018  . TSH    Standing Status:   Future    Standing Expiration Date:   05/08/2018  . ACTH    Standing Status:   Future    Standing Expiration Date:   05/08/2018  . Cortisol    Standing Status:   Future    Standing Expiration Date:   05/08/2018  . Comprehensive metabolic panel    Standing Status:   Future    Standing Expiration Date:   05/08/2018  . Magnesium    Standing Status:   Future    Standing Expiration Date:   05/08/2018    Cancer Staging No matching staging information was found for the patient.  All questions were answered.  . The patient knows to call the clinic with any problems, questions or concerns.  This note was electronically signed.    History of Presenting Illness Joshua White 66 y.o. presenting to the [place] for continuation of palliative second line immunotherapy with atezolizumab for stage IV non-small cell carcinoma of the lung with adenocarcinoma morphology. Patient presents for consideration for possible third cycle of therapy. The interim, patient has had  several complaints. One of them would be the left-sided temporal headaches which patient has not had prior to initiation of therapy. Headaches are intermittent and responding to the opioid medications at the patient takes. Patient denies any central frontal headache. Denies any nausea or vomiting. He does have occasional epigastric abdominal discomfort, continues to have mild constipation with the lumen the stools and no diarrhea. Denies any new shortness of breath, chest pain, or cough. Reports respiratory symptoms to be improved overall. He does take significant amount of opioid pain medications with MS Contin administered 3 times daily augmented by oxycodone. Discussion today, patient reports trying to treat his pain which you've level of 0.  Denies any new neurological deficits such as progressive weakness in the extremities or face or changes to sensation in hands and feet. Denies any skin rash or urinary symptoms.   Oncological/hematological History:  No history exists.    Medical History: Past Medical History:  Diagnosis Date  .  G6PD deficiency ([place])   . H/O orthostatic hypotension   . Lung cancer ([place]) dx'd 01/2017  . Pneumonia   . RBBB (right bundle branch block)   . Seizures [facility])     Surgical History: Past Surgical History:  Procedure Laterality Date  . APPENDECTOMY    . CHEST TUBE INSERTION Left 12/20/2016   Procedure: INSERTION PLEURAL DRAINAGE CATHETER;  Surgeon: Grace Isaac, MD;  Location: [place];  Service: Thoracic;  Laterality: Left;  . PLEURAL BIOPSY Left 12/20/2016   Procedure: PLEURAL BIOPSY;  Surgeon: Grace Isaac, MD;  Location: [place];  Service: Thoracic;  Laterality: Left;  . US ECHOCARDIOGRAPHY  05/12/2008   EF 55-60%  . VIDEO ASSISTED THORACOSCOPY (VATS) W/TALC PLEUADESIS Left 12/20/2016   Procedure: VIDEO ASSISTED THORACOSCOPY (VATS) W/TALC PLEURADESIS;  Surgeon: Grace Isaac, MD;  Location: [place];  Service: Thoracic;  Laterality: Left;  [name] VIDEO  BRONCHOSCOPY N/A 12/20/2016   Procedure: VIDEO BRONCHOSCOPY;  Surgeon: Grace Isaac, MD;  Location: [facility];  Service: Thoracic;  Laterality: N/A;    Family History: Family History  Problem Relation Age of Onset  . Lung cancer Mother   . Brain cancer Mother   . Heart disease Father   . Hypertension Father   . Heart attack Father     Social History: Social History   Social History  . Marital status: Single    Spouse name: N/A  . Number of children: N/A  . Years of education: N/A   Occupational History  . Not on file.   Social History Main Topics  . Smoking status: Former Smoker    Packs/day: 2.00    Years: 30.00    Types: Cigars  . Smokeless tobacco: Never Used     Comment: Quit ~2017  . Alcohol use No     Comment: Former use  . Drug use: No  . Sexual activity: Not on file   Other Topics Concern  . Not on file   Social History Narrative  . No narrative on file    Allergies: Allergies  Allergen Reactions  . Tuberculin Purified Protein Derivative Swelling and Other (See Comments)    Reaction:  Unspecified swelling reaction   . Sulfa Antibiotics Other (See Comments)    Reaction:  Causes pt to pass out     Medications:  Current Outpatient Prescriptions  Medication Sig Dispense Refill  . aspirin EC 81 MG tablet Take 81 mg by mouth daily.    [name] dexamethasone (DECADRON) 4 MG tablet Take 1 tablet (4 mg total) by mouth 2 (two) times daily. 10 tablet 0  . dronabinol (MARINOL) 5 MG capsule Take 1 capsule (5 mg total) by mouth 2 (two) times daily before a meal. 60 capsule 0  . folic acid (FOLVITE) 1 MG tablet Take 1 tablet (1 mg total) by mouth daily. Start 5-7 days before Alimta chemotherapy. Continue until 21 days after Alimta completed. 100 tablet 3  . morphine (MS CONTIN) 60 MG 12 hr tablet Take 2 tablets (120 mg total) by mouth every 12 (twelve) hours. 90 tablet 0  . ondansetron (ZOFRAN) 8 MG tablet Take 1 tablet (8 mg total) by mouth 2 (two) times daily as  needed for refractory nausea / vomiting. Start on day 3 after chemo. 30 tablet 1  . Oxycodone HCl 10 MG TABS Take 1 tablet (10 mg total) by mouth every 4 (four) hours as needed. 60 tablet 0  . polyethylene glycol (MIRALAX) packet Take 17 g by  mouth daily. 30 each 1  . prochlorperazine (COMPAZINE) 10 MG tablet Take 1 tablet (10 mg total) by mouth every 6 (six) hours as needed (Nausea or vomiting). 30 tablet 1  . senna-docusate (SENNA S) 8.6-50 MG tablet Take 2 tablets by mouth 2 (two) times daily. 60 tablet 2   No current facility-administered medications for this visit.     Review of Systems: Review of Systems  All other systems reviewed and are negative.    PHYSICAL EXAMINATION Blood pressure 136/79, pulse 88, temperature 98.5 F (36.9 C), temperature source Oral, resp. rate 17, height _0  (1.753 m), weight 128 lb 11.2 oz (58.4 kg), SpO2 98 %.  ECOG PERFORMANCE STATUS: 3 - Symptomatic, >50% confined to bed  Physical Exam  Constitutional: He is oriented to person, place, and time. Vital signs are normal. He appears not lethargic and not jaundiced. He appears cachectic. No distress.  HENT:  Mouth/Throat: Oropharynx is clear and moist and mucous membranes are normal. Mucous membranes are not dry. No oropharyngeal exudate.  Eyes: Conjunctivae and EOM are normal. Pupils are equal, round, and reactive to light. No scleral icterus.  Neck: No JVD present. No thyromegaly present.  Cardiovascular: Normal rate, regular rhythm and normal heart sounds.  Exam reveals no gallop.   No murmur heard. Pulmonary/Chest:  Bilateral gluteal irradiation of the lungs with bibasilar crackles, but no expiratory wheezing or stridor. No dullness to percussion.  Abdominal: Soft. Normal appearance. He exhibits no distension, no ascites and no mass. There is no hepatosplenomegaly. There is no tenderness.  Musculoskeletal:  Significant muscle atrophy in a generalized fashion. Diffuse and symmetric reduction in  muscle strength 4+/5  Lymphadenopathy:       Head (right side): No submandibular and no occipital adenopathy present.       Head (left side): No submandibular and no occipital adenopathy present.    He has no cervical adenopathy.    He has no axillary adenopathy.       Right: No inguinal and no supraclavicular adenopathy present.       Left: No inguinal and no supraclavicular adenopathy present.  Neurological: He is alert and oriented to person, place, and time. He has normal sensation and intact cranial nerves. He appears not lethargic.  Skin: Skin is warm and dry. He is not diaphoretic.     LABORATORY DATA: I have personally reviewed the data as listed: Appointment on 05/08/2017  Component Date Value Ref Range Status  . WBC 05/08/2017 5.3  4.0 - 10.3 10e3/uL Final  . NEUT# 05/08/2017 4.3  1.5 - 6.5 10e3/uL Final  . HGB 05/08/2017 10.6* 13.0 - 17.1 g/dL Final  . HCT 05/08/2017 32.0* 38.4 - 49.9 % Final  . Platelets 05/08/2017 307  140 - 400 10e3/uL Final  . MCV 05/08/2017 96.2  79.3 - 98.0 fL Final  . MCH 05/08/2017 31.8  27.2 - 33.4 pg Final  . MCHC 05/08/2017 33.0  32.0 - 36.0 g/dL Final  . RBC 05/08/2017 3.33* 4.20 - 5.82 10e6/uL Final  . RDW 05/08/2017 16.3* 11.0 - 14.6 % Final  . lymph# 05/08/2017 0.7* 0.9 - 3.3 10e3/uL Final  . MONO# 05/08/2017 0.3  0.1 - 0.9 10e3/uL Final  . Eosinophils Absolute 05/08/2017 0.0  0.0 - 0.5 10e3/uL Final  . Basophils Absolute 05/08/2017 0.0  0.0 - 0.1 10e3/uL Final  . NEUT% 05/08/2017 82.0* 39.0 - 75.0 % Final  . LYMPH% 05/08/2017 12.3* 14.0 - 49.0 % Final  . MONO% 05/08/2017 4.8  0.0 -  14.0 % Final  . EOS% 05/08/2017 0.3  0.0 - 7.0 % Final  . BASO% 05/08/2017 0.6  0.0 - 2.0 % Final  . Sodium 05/08/2017 132* 136 - 145 mEq/L Final  . Potassium 05/08/2017 4.6  3.5 - 5.1 mEq/L Final  . Chloride 05/08/2017 98  98 - 109 mEq/L Final  . CO2 05/08/2017 26  22 - 29 mEq/L Final  . Glucose 05/08/2017 104  70 - 140 mg/dl Final   Glucose reference  range is for nonfasting patients. Fasting glucose reference range is 70- 100.  [name] BUN 05/08/2017 12.5  7.0 - 26.0 mg/dL Final  . Creatinine 05/08/2017 0.8  0.7 - 1.3 mg/dL Final  . Total Bilirubin 05/08/2017 0.58  0.20 - 1.20 mg/dL Final  . Alkaline Phosphatase 05/08/2017 77  40 - 150 U/L Final  . AST 05/08/2017 44* 5 - 34 U/L Final  . ALT 05/08/2017 19  0 - 55 U/L Final  . Total Protein 05/08/2017 8.7* 6.4 - 8.3 g/dL Final  . Albumin 05/08/2017 2.6* 3.5 - 5.0 g/dL Final  . Calcium 05/08/2017 11.4* 8.4 - 10.4 mg/dL Final  . Anion Gap 05/08/2017 7  3 - 11 mEq/L Final  . EGFR 05/08/2017 >90  >90 ml/min/1.73 m2 Final   eGFR is calculated using the CKD-EPI Creatinine Equation (2009)       Ardath Sax, MD

## 2017-05-17 ENCOUNTER — Other Ambulatory Visit: Payer: Self-pay | Admitting: *Deleted

## 2017-05-17 ENCOUNTER — Telehealth: Payer: Self-pay | Admitting: *Deleted

## 2017-05-17 ENCOUNTER — Other Ambulatory Visit: Payer: Self-pay | Admitting: Hematology

## 2017-05-17 NOTE — Telephone Encounter (Signed)
SW pt regarding CT scan.  Pt stated he had not heard from central scheduling.  Informed pt he would need to have scan done prior to returning to office visit next week.  Number to central scheduling provided to get scan scheduled.  Pt verbalized understanding.

## 2017-05-25 ENCOUNTER — Ambulatory Visit (HOSPITAL_COMMUNITY)
Admission: RE | Admit: 2017-05-25 | Discharge: 2017-05-25 | Disposition: A | Discharge status: Home / Self Care | Payer: Medicare Other | Source: Ambulatory Visit | Attending: Hematology and Oncology | Admitting: Hematology and Oncology

## 2017-05-25 DIAGNOSIS — K8020 Calculus of gallbladder without cholecystitis without obstruction: Secondary | ICD-10-CM | POA: Insufficient documentation

## 2017-05-25 DIAGNOSIS — I2510 Atherosclerotic heart disease of native coronary artery without angina pectoris: Secondary | ICD-10-CM | POA: Insufficient documentation

## 2017-05-25 DIAGNOSIS — I700 Atherosclerosis of aorta: Secondary | ICD-10-CM | POA: Insufficient documentation

## 2017-05-25 DIAGNOSIS — R918 Other nonspecific abnormal finding of lung field: Secondary | ICD-10-CM | POA: Insufficient documentation

## 2017-05-25 DIAGNOSIS — J432 Centrilobular emphysema: Secondary | ICD-10-CM | POA: Diagnosis not present

## 2017-05-25 DIAGNOSIS — C3492 Malignant neoplasm of unspecified part of left bronchus or lung: Secondary | ICD-10-CM | POA: Diagnosis not present

## 2017-05-25 DIAGNOSIS — C771 Secondary and unspecified malignant neoplasm of intrathoracic lymph nodes: Secondary | ICD-10-CM | POA: Insufficient documentation

## 2017-05-25 DIAGNOSIS — C3491 Malignant neoplasm of unspecified part of right bronchus or lung: Secondary | ICD-10-CM | POA: Diagnosis not present

## 2017-05-25 MED ORDER — IOPAMIDOL (ISOVUE-300) INJECTION 61%
100.0000 mL | Freq: Once | INTRAVENOUS | Status: AC | PRN
  Administered 2017-05-25: 100 mL via INTRAVENOUS

## 2017-05-25 MED ORDER — IOPAMIDOL (ISOVUE-300) INJECTION 61%
INTRAVENOUS | Status: AC
  Filled 2017-05-25: qty 100

## 2017-05-29 ENCOUNTER — Telehealth: Payer: Self-pay

## 2017-05-29 ENCOUNTER — Other Ambulatory Visit (HOSPITAL_BASED_OUTPATIENT_CLINIC_OR_DEPARTMENT_OTHER): Payer: Medicare Other

## 2017-05-29 ENCOUNTER — Ambulatory Visit (HOSPITAL_BASED_OUTPATIENT_CLINIC_OR_DEPARTMENT_OTHER): Payer: Medicare Other | Admitting: Hematology

## 2017-05-29 ENCOUNTER — Ambulatory Visit: Payer: Medicare Other

## 2017-05-29 ENCOUNTER — Telehealth (HOSPITAL_COMMUNITY): Payer: Self-pay | Admitting: Physician Assistant

## 2017-05-29 VITALS — BP 109/62 | HR 93 | Temp 98.6°F | Resp 18 | Ht 69.0 in | Wt 125.3 lb

## 2017-05-29 DIAGNOSIS — J910 Malignant pleural effusion: Secondary | ICD-10-CM

## 2017-05-29 DIAGNOSIS — C3492 Malignant neoplasm of unspecified part of left bronchus or lung: Secondary | ICD-10-CM | POA: Diagnosis present

## 2017-05-29 DIAGNOSIS — G893 Neoplasm related pain (acute) (chronic): Secondary | ICD-10-CM

## 2017-05-29 DIAGNOSIS — E440 Moderate protein-calorie malnutrition: Secondary | ICD-10-CM | POA: Diagnosis not present

## 2017-05-29 DIAGNOSIS — D649 Anemia, unspecified: Secondary | ICD-10-CM

## 2017-05-29 DIAGNOSIS — Z79899 Other long term (current) drug therapy: Secondary | ICD-10-CM

## 2017-05-29 DIAGNOSIS — Z5112 Encounter for antineoplastic immunotherapy: Secondary | ICD-10-CM

## 2017-05-29 DIAGNOSIS — D696 Thrombocytopenia, unspecified: Secondary | ICD-10-CM

## 2017-05-29 DIAGNOSIS — C3482 Malignant neoplasm of overlapping sites of left bronchus and lung: Secondary | ICD-10-CM

## 2017-05-29 LAB — COMPREHENSIVE METABOLIC PANEL
ALBUMIN: 2.2 g/dL — AB (ref 3.5–5.0)
ALK PHOS: 70 U/L (ref 40–150)
ALT: 54 U/L (ref 0–55)
ANION GAP: 7 meq/L (ref 3–11)
AST: 82 U/L — ABNORMAL HIGH (ref 5–34)
BILIRUBIN TOTAL: 0.51 mg/dL (ref 0.20–1.20)
BUN: 14.1 mg/dL (ref 7.0–26.0)
CO2: 24 mEq/L (ref 22–29)
Calcium: 8.1 mg/dL — ABNORMAL LOW (ref 8.4–10.4)
Chloride: 102 mEq/L (ref 98–109)
Creatinine: 0.6 mg/dL — ABNORMAL LOW (ref 0.7–1.3)
GLUCOSE: 110 mg/dL (ref 70–140)
POTASSIUM: 4.2 meq/L (ref 3.5–5.1)
SODIUM: 133 meq/L — AB (ref 136–145)
TOTAL PROTEIN: 7.3 g/dL (ref 6.4–8.3)

## 2017-05-29 LAB — CBC WITH DIFFERENTIAL/PLATELET
BASO%: 0.6 % (ref 0.0–2.0)
BASOS ABS: 0 10*3/uL (ref 0.0–0.1)
EOS ABS: 0 10*3/uL (ref 0.0–0.5)
EOS%: 0.2 % (ref 0.0–7.0)
HCT: 29.6 % — ABNORMAL LOW (ref 38.4–49.9)
HEMOGLOBIN: 9.5 g/dL — AB (ref 13.0–17.1)
LYMPH%: 9.6 % — AB (ref 14.0–49.0)
MCH: 30.5 pg (ref 27.2–33.4)
MCHC: 32.2 g/dL (ref 32.0–36.0)
MCV: 94.8 fL (ref 79.3–98.0)
MONO#: 0.4 10*3/uL (ref 0.1–0.9)
MONO%: 6.9 % (ref 0.0–14.0)
NEUT#: 4.8 10*3/uL (ref 1.5–6.5)
NEUT%: 82.7 % — ABNORMAL HIGH (ref 39.0–75.0)
PLATELETS: 333 10*3/uL (ref 140–400)
RBC: 3.12 10*6/uL — ABNORMAL LOW (ref 4.20–5.82)
RDW: 15.9 % — AB (ref 11.0–14.6)
WBC: 5.8 10*3/uL (ref 4.0–10.3)
lymph#: 0.6 10*3/uL — ABNORMAL LOW (ref 0.9–3.3)

## 2017-05-29 LAB — TSH: TSH: 0.949 m[IU]/L (ref 0.320–4.118)

## 2017-05-29 LAB — MAGNESIUM: MAGNESIUM: 2.1 mg/dL (ref 1.5–2.5)

## 2017-05-29 MED ORDER — SENNOSIDES-DOCUSATE SODIUM 8.6-50 MG PO TABS: 3.0000 | ORAL_TABLET | Freq: Two times a day (BID) | ORAL | 2 refills | Status: AC

## 2017-05-29 MED ORDER — MORPHINE SULFATE ER 60 MG PO TBCR: 120.0000 mg | EXTENDED_RELEASE_TABLET | Freq: Three times a day (TID) | ORAL | 0 refills | Status: DC

## 2017-05-29 MED ORDER — POLYETHYLENE GLYCOL 3350 17 G PO PACK: 17.0000 g | PACK | Freq: Two times a day (BID) | ORAL | 1 refills | Status: AC

## 2017-05-29 MED ORDER — OXYCODONE HCL 10 MG PO TABS: 10.0000 mg | ORAL_TABLET | ORAL | 0 refills | Status: DC | PRN

## 2017-05-29 NOTE — Progress Notes (Signed)
Nutrition  Planning nutrition follow-up during infusion today but infusion cancelled.  Will plan to follow-up during next infusion.  Laporsche Hoeger B. Zenia Resides, [place], [place] Registered Dietitian [phone] (pager)

## 2017-05-29 NOTE — Telephone Encounter (Signed)
04/27/2018 talked with pt said he just got out of hospital and would have to think about the stress test and holter and he would call back.lm 04/09/2017 LM with patient family we need to get this scheduled she will have to talk to his son and call us back. stpegram 03/21/17 PER RACHEDL MATTHEWS-----Cancel Rsn: Patient (Pt son states he will call back an r/s, family emergecny)  I called and spoke with patient today(05/29/17) in regards to rescheduling his appt and he voiced that he did not want to have this test. He will be removed from the workqueue.

## 2017-05-29 NOTE — Telephone Encounter (Signed)
appts made and avs printed for patient 

## 2017-05-30 ENCOUNTER — Ambulatory Visit (HOSPITAL_COMMUNITY): Payer: Medicare Other

## 2017-05-30 ENCOUNTER — Telehealth: Payer: Self-pay | Admitting: *Deleted

## 2017-05-30 LAB — ACTH: ACTH: 11.6 pg/mL (ref 7.2–63.3)

## 2017-05-30 LAB — CORTISOL: CORTISOL: 26.4 ug/dL

## 2017-05-30 LAB — PHOSPHORUS: PHOSPHORUS: 3.1 mg/dL (ref 2.5–4.5)

## 2017-05-30 NOTE — Telephone Encounter (Signed)
Joshua White,refused to have a monitor,ordered on 02/2017.  Okay per K.Thompson to remove.

## 2017-05-31 ENCOUNTER — Encounter: Payer: Self-pay | Admitting: Hematology

## 2017-05-31 NOTE — Progress Notes (Signed)
Received prior auth request for Oxycodone and Morphine.  Called [facility] pharmacy([place]) to check on prior approval. He states it went through and the patient already picked up the medication.

## 2017-06-06 NOTE — Progress Notes (Signed)
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  HPI  Joshua White is a 66 y.o. male who has been referred to [place] by Dr Aldine Contes, MD for evaluation and management of likely metastatic lung cancer.  Patient has a history of seizures, G6PD deficiency, right bundle branch block , heavy smoking history and hepatitis C and is just about done with his 12 week treatment with Harvoni for his hepatitis C. Patient notes that he has had progressive fatigue and anorexia for the last 2-3 months. He initially assigned his symptoms to his treatment for hepatitis C with Harvoni but was concerned when the symptoms persisted. He notes he has lost about 15-20 pounds over the last couple of months. She presented to the emergency room about a week ago with significant shortness of breath for 2-3 weeks  and was noted to have a large pleural effusion and had a therapeutic thoracentesis with removal of 1 L fluid which was sent for analysis. It was noted to be exudative with cytology concerning for atypical cells which are likely malignant.  Patient presented to his primary care physician in follow-up and was noted to have significant shortness of breath again and recurrent large left-sided pleural effusion for which she was admitted. He had a CT of the chest which showed an extremely large left-sided pleural effusion occupying majority of the left hemithorax with dense consolidation of most of the left lung. Diffuse infiltrating mass along the left side of the mediastinum measuring 3.6 cm and extending into much of the pleura and left hemithorax  . He was also noted to have mediastinal lymphadenopathy and a 2.1 cm nodular opacity in the right lung . Concern for possible metastases in the epicardial fat pad . Also had mildly prominent retroperitoneal lymph nodes measuring about 8 mm which were nonspecific . CT abdomen showed no other obvious metastatic disease.  Patient had an MRI of the brain which shows no evidence of metastatic disease. 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  Patient has had a repeat therapeutic thoracentesis today cytology from this is currently pending. Cardiothoracic surgery was consulted for his recurrent large pleural effusion and there are plans for a VATS procedure with pleural biopsy and possible placement of a Pleurx catheter on 12/20/2016.  Patient notes his breathing is somewhat better after the thoracentesis. Has some left sided chest pain.  He notes that he smoked about 1-2 packs a day since age 66 and quit about a year ago. No headaches no focal neurological deficits. No abdominal pain or distention. Weight loss of about 20 pounds over the last couple months.  Pathology results showed poorly differentiated lung adenocarcinoma.  PDL1 testing 0% Other [employer] One results pending  INTERVAL HISTORY:  Patient is here for follow-up prior to his 4th dose of second line treatment with Atezolizumab. Patient notes his pain is controlled with the current medications and he was given refills of his MS Contin and OxyIR . We also refilled his constipation medications . He had a CT chest abdomen pelvis on 05/25/2017 that show development of multiple new right lung metastases and progression  of metastatic disease in several areas. We discussed that this represents progression on his current treatment. We discussed option for third line chemotherapy with single agent gemcitabine or docetaxel versus best supportive care through hospice and palliative care. Patient's son  and daughter were present within further discussion. They would like to have additional time to think about it before making a decision. No hemoptysis no significant cough.   REVIEW OF SYSTEMS:    10 Point review of systems of done and is negative except as noted above.  . Past Medical History:  Diagnosis Date  . G6PD deficiency ([place])   . H/O orthostatic hypotension   . Lung cancer ([place]) dx'd 01/2017  . Pneumonia   . RBBB (right bundle branch block)   . Seizures ([place])     . Past Surgical History:  Procedure Laterality Date  . APPENDECTOMY    . CHEST TUBE INSERTION Left 12/20/2016   Procedure: INSERTION PLEURAL DRAINAGE CATHETER;  Surgeon: Grace Isaac, MD;  Location: [facility];  Service: Thoracic;  Laterality: Left;  . PLEURAL BIOPSY Left 12/20/2016   Procedure: PLEURAL BIOPSY;  Surgeon: Grace Isaac, MD;  Location: [place];  Service: Thoracic;  Laterality: Left;  . US ECHOCARDIOGRAPHY  05/12/2008   EF 55-60%  . VIDEO ASSISTED THORACOSCOPY (VATS) W/TALC PLEUADESIS Left 12/20/2016   Procedure: VIDEO ASSISTED THORACOSCOPY (VATS) W/TALC PLEURADESIS;  Surgeon: Grace Isaac, MD;  Location: [facility];  Service: Thoracic;  Laterality: Left;  [name] VIDEO BRONCHOSCOPY N/A 12/20/2016   Procedure: VIDEO BRONCHOSCOPY;  Surgeon: Grace Isaac, MD;  Location[facility];  Service: Thoracic;  Laterality: N/A;    . Social History  Substance Use Topics  . Smoking status: Former Smoker    Packs/day: 2.00    Years: 30.00    Types: Cigars  . Smokeless tobacco: Never Used     Comment: Quit ~2017  . Alcohol use No     Comment: Former use    ALLERGIES:  is allergic to tuberculin purified protein derivative and sulfa antibiotics.  MEDICATIONS:  Current Outpatient Prescriptions  Medication Sig Dispense Refill  . aspirin EC 81 MG tablet Take 81 mg by mouth daily.    [name] dexamethasone (DECADRON) 4 MG tablet Take 1 tablet (4 mg total) by mouth 2 (two) times daily. 10 tablet 0  . dronabinol  (MARINOL) 5 MG capsule Take 1 capsule (5 mg total) by mouth 2 (two) times daily before a meal. 60 capsule 0  . folic acid (FOLVITE) 1 MG tablet Take 1 tablet (1 mg total) by mouth daily. Start 5-7 days before Alimta chemotherapy. Continue until 21 days after Alimta completed. 100 tablet 3  . morphine (MS CONTIN) 60 MG 12 hr tablet Take 2 tablets (120 mg total) by mouth every 8 (eight) hours. 180 tablet 0  . ondansetron (ZOFRAN) 8 MG tablet Take 1 tablet (8 mg total) by mouth 2 (two) times daily as needed for refractory nausea / vomiting. Start on day 3 after chemo. 30 tablet 1  . Oxycodone HCl 10 MG TABS Take 1-2 tablets (10-20 mg total) by mouth every 4 (four) hours as needed. 129 tablet 0  . polyethylene glycol (MIRALAX) packet Take 17 g by mouth 2 (two) times daily. 60 each 1  . prochlorperazine (COMPAZINE) 10 MG tablet Take 1 tablet (10 mg total) by mouth every 6 (six) hours as needed (Nausea or vomiting). 30 tablet 1  . senna-docusate (SENNA S) 8.6-50 MG tablet Take 3 tablets by mouth 2 (  two) times daily. 180 tablet 2   No current facility-administered medications for this visit.     PHYSICAL EXAMINATION: ECOG PERFORMANCE STATUS: 2 - Symptomatic, <50% confined to bed  . Vitals:   05/29/17 1221  BP: 109/62  Pulse: 93  Resp: 18  Temp: 98.6 F (37 C)    Filed Weights   05/29/17 1221  Weight: 125 lb 4.8 oz (56.8 kg)   .Body mass index is 18.5 kg/m. . Wt Readings from Last 3 Encounters:  05/29/17 125 lb 4.8 oz (56.8 kg)  05/08/17 128 lb 11.2 oz (58.4 kg)  04/27/17 128 lb (58.1 kg)    GENERAL:alert, in no acute distress and comfortable SKIN: no acute rashes, no significant lesions EYES: conjunctiva are pink and non-injected, sclera anicteric OROPHARYNX: MMM, no exudates, no oropharyngeal erythema or ulceration NECK: supple, no JVD LYMPH:  no palpable lymphadenopathy in the cervical, axillary or inguinal regions LUNGS:Decreased at entry left lung base. HEART: regular rate &  rhythm ABDOMEN:  normoactive bowel sounds , non tender, not distended. Extremity: no pedal edema PSYCH: alert & oriented x 3 with fluent speech NEURO: no focal motor/sensory deficits  LABORATORY DATA:   I have reviewed the data as listed  CBC Latest Ref Rng & Units 05/29/2017 05/08/2017 04/17/2017  WBC 4.0 - 10.3 10e3/uL 5.8 5.3 4.4  Hemoglobin 13.0 - 17.1 g/dL 9.5(L) 10.6(L) 9.0(L)  Hematocrit 38.4 - 49.9 % 29.6(L) 32.0(L) 28.4(L)  Platelets 140 - 400 10e3/uL 333 307 306    . CMP Latest Ref Rng & Units 05/29/2017 05/08/2017 04/17/2017  Glucose 70 - 140 mg/dl 110 104 84  BUN 7.0 - 26.0 mg/dL 14.1 12.5 12.8  Creatinine 0.7 - 1.3 mg/dL 0.6(L) 0.8 0.8  Sodium 136 - 145 mEq/L 133(L) 132(L) 135(L)  Potassium 3.5 - 5.1 mEq/L 4.2 4.6 4.3  Chloride 101 - 111 mmol/L - - -  CO2 22 - 29 mEq/L _0 Calcium 8.4 - 10.4 mg/dL 8.1(L) 11.4(H) 10.5(H)  Total Protein 6.4 - 8.3 g/dL 7.3 8.7(H) 8.7(H)  Total Bilirubin 0.20 - 1.20 mg/dL 0.51 0.58 0.54  Alkaline Phos 40 - 150 U/L 70 77 74  AST 5 - 34 U/L 82(H) 44(H) 19  ALT 0 - 55 U/L 54 19 9           RADIOGRAPHIC STUDIES: I have personally reviewed the radiological images as listed and agreed with the findings in the report. Ct Chest W Contrast  Result Date: 05/25/2017 CLINICAL DATA:  Followup stage IV lung carcinoma. Currently undergoing immunotherapy. EXAM: CT CHEST, ABDOMEN, AND PELVIS WITH CONTRAST TECHNIQUE: Multidetector CT imaging of the chest, abdomen and pelvis was performed following the standard protocol during bolus administration of intravenous contrast. CONTRAST:  158m ISOVUE-300 IOPAMIDOL (ISOVUE-300) INJECTION 61% COMPARISON:  03/13/2017. FINDINGS: CT CHEST FINDINGS Cardiovascular: Normal sized heart. Aortic and coronary artery calcifications. The ascending thoracic aorta currently measures 3.9 cm in maximum diameter. Mediastinum/Nodes: The previously demonstrated 1.3 cm short axis AP window lymph node has a short axis  diameter of 1.5 cm on image number 28 of series 2. The previously demonstrated 1.2 cm short axis AP window lymph node has a short axis diameter of 1.7 cm on image number 30 of series 2. The previously demonstrated 0.9 cm right lower peritracheal lymph node has a corresponding diameter of 0.9 cm on image number 29 of series 2. The previously demonstrated 1.1 cm short axis pericardial node at the cardiac apex currently has a short axis diameter of 1.3  cm on image number 58 of series 2. Nodular pericardial soft tissue thickening is again demonstrated. The largest individual component currently has all a thickness of 1.1 cm on image number 45 of series 2, previously 0.9 cm. Lungs/Pleura: The rind of pleural thickening on the left demonstrates a further increase in thickness, currently measuring 1.3 cm posterolaterally on image number 49 of series 2, previously 1.0 cm. The encased, loculated pleural fluid is correspondingly more narrower in diameter with stable peripheral calcification. The previously demonstrated 4.1 x 3.5 cm oval, mass-like component of the thickened pleural rind currently has corresponding dimensions of 4.2 x 3.6 cm on image number 20 of series 2. Bilateral bullous changes have not changed significantly. There are multiple interval small nodules in the right lung. These include a 5 mm right lower lobe nodule on image number 86 of series 4 and a large number of similar size and smaller nodules throughout the right lung. There is also interval patchy opacity in the dependent portion of the right lower lobe. This has a more confluent and nodular appearance on image number 124 of series 4, measuring 9 x 8 mm. Musculoskeletal: Minimal thoracic and lower cervical spine degenerative changes. No evidence of bony metastatic disease. CT ABDOMEN PELVIS FINDINGS Hepatobiliary: Dependent sludge or noncalcified gallstones in the gallbladder. No gallbladder wall thickening or pericholecystic edema. Unremarkable  liver. Pancreas: Previously noted pancreas divisum. Spleen: Normal in size without focal abnormality. Adrenals/Urinary Tract: Tiny bilateral renal cortical cysts. Normal appearing adrenal glands, ureters and urinary bladder. Stomach/Bowel: Unremarkable stomach, small bowel and colon. No evidence of appendicitis. Vascular/Lymphatic: Significant increase in size of multiple enlarged gastrohepatic ligament, retrocrural, pericaval and periaortic lymph nodes. The previously demonstrated 1.0 cm short axis left periaortic lymph node currently has a short axis diameter of 1.3 cm on image number 76 of series 2. A previously demonstrated left inferior para-aortic node previously had a short axis diameter of 0.9 cm and currently has a short axis diameter of 1.4 cm on image number 90 of series 2. Reproductive: Prostate is unremarkable. Other: None. Musculoskeletal: Minimal lumbar spine degenerative changes without evidence of bony metastatic disease. IMPRESSION: 1. Interval multiple right lung metastases. 2. Progressive pleural rind of tumor on the left. 3. Progressive mediastinal metastatic adenopathy. 4. Progressive upper abdominal and retroperitoneal adenopathy. 5. Progressive pericardial metastatic disease. 6. Stable changes of COPD with centrilobular and paraseptal emphysema. 7. Interval patchy atelectasis or pneumonia in the dependent right lower lobe. 8. Sludge or noncalcified gallstones in the gallbladder. 9. Calcified coronary artery and aortic atherosclerosis. Electronically Signed   By: Claudie Revering M.D.   On: 05/25/2017 16:17   Ct Abdomen Pelvis W Contrast  Result Date: 05/25/2017 CLINICAL DATA:  Followup stage IV lung carcinoma. Currently undergoing immunotherapy. EXAM: CT CHEST, ABDOMEN, AND PELVIS WITH CONTRAST TECHNIQUE: Multidetector CT imaging of the chest, abdomen and pelvis was performed following the standard protocol during bolus administration of intravenous contrast. CONTRAST:  164m ISOVUE-300  IOPAMIDOL (ISOVUE-300) INJECTION 61% COMPARISON:  03/13/2017. FINDINGS: CT CHEST FINDINGS Cardiovascular: Normal sized heart. Aortic and coronary artery calcifications. The ascending thoracic aorta currently measures 3.9 cm in maximum diameter. Mediastinum/Nodes: The previously demonstrated 1.3 cm short axis AP window lymph node has a short axis diameter of 1.5 cm on image number 28 of series 2. The previously demonstrated 1.2 cm short axis AP window lymph node has a short axis diameter of 1.7 cm on image number 30 of series 2. The previously demonstrated 0.9 cm right lower peritracheal lymph  node has a corresponding diameter of 0.9 cm on image number 29 of series 2. The previously demonstrated 1.1 cm short axis pericardial node at the cardiac apex currently has a short axis diameter of 1.3 cm on image number 58 of series 2. Nodular pericardial soft tissue thickening is again demonstrated. The largest individual component currently has all a thickness of 1.1 cm on image number 45 of series 2, previously 0.9 cm. Lungs/Pleura: The rind of pleural thickening on the left demonstrates a further increase in thickness, currently measuring 1.3 cm posterolaterally on image number 49 of series 2, previously 1.0 cm. The encased, loculated pleural fluid is correspondingly more narrower in diameter with stable peripheral calcification. The previously demonstrated 4.1 x 3.5 cm oval, mass-like component of the thickened pleural rind currently has corresponding dimensions of 4.2 x 3.6 cm on image number 20 of series 2. Bilateral bullous changes have not changed significantly. There are multiple interval small nodules in the right lung. These include a 5 mm right lower lobe nodule on image number 86 of series 4 and a large number of similar size and smaller nodules throughout the right lung. There is also interval patchy opacity in the dependent portion of the right lower lobe. This has a more confluent and nodular appearance on  image number 124 of series 4, measuring 9 x 8 mm. Musculoskeletal: Minimal thoracic and lower cervical spine degenerative changes. No evidence of bony metastatic disease. CT ABDOMEN PELVIS FINDINGS Hepatobiliary: Dependent sludge or noncalcified gallstones in the gallbladder. No gallbladder wall thickening or pericholecystic edema. Unremarkable liver. Pancreas: Previously noted pancreas divisum. Spleen: Normal in size without focal abnormality. Adrenals/Urinary Tract: Tiny bilateral renal cortical cysts. Normal appearing adrenal glands, ureters and urinary bladder. Stomach/Bowel: Unremarkable stomach, small bowel and colon. No evidence of appendicitis. Vascular/Lymphatic: Significant increase in size of multiple enlarged gastrohepatic ligament, retrocrural, pericaval and periaortic lymph nodes. The previously demonstrated 1.0 cm short axis left periaortic lymph node currently has a short axis diameter of 1.3 cm on image number 76 of series 2. A previously demonstrated left inferior para-aortic node previously had a short axis diameter of 0.9 cm and currently has a short axis diameter of 1.4 cm on image number 90 of series 2. Reproductive: Prostate is unremarkable. Other: None. Musculoskeletal: Minimal lumbar spine degenerative changes without evidence of bony metastatic disease. IMPRESSION: 1. Interval multiple right lung metastases. 2. Progressive pleural rind of tumor on the left. 3. Progressive mediastinal metastatic adenopathy. 4. Progressive upper abdominal and retroperitoneal adenopathy. 5. Progressive pericardial metastatic disease. 6. Stable changes of COPD with centrilobular and paraseptal emphysema. 7. Interval patchy atelectasis or pneumonia in the dependent right lower lobe. 8. Sludge or noncalcified gallstones in the gallbladder. 9. Calcified coronary artery and aortic atherosclerosis. Electronically Signed   By: Claudie Revering M.D.   On: 05/25/2017 16:17    ASSESSMENT & PLAN:    66 year old male  with  #1 Metastatic lung adenocarcinoma (with M1a disease - pleural nodules and malignant pleural effusion) PDL1 0%  Negative for EGFR, ALK, ROS1 and BRAF mutations. Patient is now status post 3 cycles of palliative carboplatin and Alimta chemotherapy which he has tolerated well. CT chest abdomen pelvis 03/13/2017 shows concern for disease progression in the left lung. Patient tolerated the 3 cycles of Atezolizumab well without any acute concerns. CT chest abdomen pelvis on 05/25/2017 shows evidence of disease progression as noted above  #2 Recurrent left-sided malignant pleural effusion -now s/p pleurex catheter placement and drainage.   Plan -  We discussed his restaging CT chest abdomen pelvis in details. He has clear evidence of disease progression including new lesions in his right lung. -Hold off on his fourth cycle of Atezolizumab due today.  -I had a detailed cause of care discussion again with the patient and his accompanying family. -We discussed third line treatment options including single agent docetaxel versus gemcitabine or consideration of best supportive care's through hospice. Patient's son and daughter weret present for the discussion -Patient requests an additional time to mull over the treatment options and will let us know about his decision. He was given patient handouts for docetaxel and gemcitabine.  #4 ex-smoker with more than 100-pack-year history of smoking  -he has quit smoking about a year ago.  #5 exalcohol abuse and remote IVDU- patient notes no recent IV drug use and has been sober from alcohol use from a year.  #6 hepatitis C has finished treatment with harvonisoon before being diagnosed with lung cancer.  #5 moderate protein calorie malnutrition and anorexia due to malignancy and recent treatment for hepatitis C. . Wt Readings from Last 3 Encounters:  05/29/17 125 lb 4.8 oz (56.8 kg)  05/08/17 128 lb 11.2 oz (58.4 kg)  04/27/17 128 lb (58.1 kg)    -continue on Marinol 5 mg by mouth twice a day -Patient encouraged to increase his by mouth food intake. No specific limiting factor is no nausea  #6 cancer related pain - better controlled -continue MS contin to 119m po q8h for pain and DOE/SOB. -Oxycodone every 4 hours as needed for breakthrough pain -Senna S and MiraLAX for bowel prophylaxis.   #7  anemia- stable -transfuse prn for symptomatic anemia  #8 Thrombocytopenia - resolved platelet counts are normal today -monitor  #9 hypercalcemia of malignancy receive Zometa 3 weeks ago. His calcium levels are okay today and we will hold off on Zometa dose today.   RTC in 3 weeks with labs with Dr KIrene Limbozometa in q3 weeks  I spent 30 minutes counseling the patient face to face. The total time spent in the appointment was 40 minutes and more than 50% was on counseling and direct patient cares.    GSullivan LoneMD MWest End-Cobb TownAAHIVMS SPenobscot Bay Medical CenterCPriscilla Chan & Mark Z[facility] Trauma CenterHematology/Oncology Physician CMagnolia Hospital (Office):       [phone](Work cell):  [phone](Fax):           [phone]

## 2017-06-18 ENCOUNTER — Other Ambulatory Visit: Payer: Self-pay | Admitting: *Deleted

## 2017-06-18 DIAGNOSIS — C3482 Malignant neoplasm of overlapping sites of left bronchus and lung: Secondary | ICD-10-CM

## 2017-06-19 ENCOUNTER — Telehealth: Payer: Self-pay | Admitting: *Deleted

## 2017-06-19 ENCOUNTER — Ambulatory Visit: Payer: Medicare Other

## 2017-06-19 ENCOUNTER — Ambulatory Visit (HOSPITAL_BASED_OUTPATIENT_CLINIC_OR_DEPARTMENT_OTHER): Payer: Medicare Other | Admitting: Hematology

## 2017-06-19 ENCOUNTER — Encounter: Payer: Self-pay | Admitting: Hematology

## 2017-06-19 ENCOUNTER — Telehealth: Payer: Self-pay | Admitting: Hematology

## 2017-06-19 ENCOUNTER — Other Ambulatory Visit: Payer: Medicare Other

## 2017-06-19 VITALS — BP 133/84 | HR 124 | Temp 97.7°F | Resp 16 | Ht 69.0 in | Wt 107.4 lb

## 2017-06-19 DIAGNOSIS — C3492 Malignant neoplasm of unspecified part of left bronchus or lung: Secondary | ICD-10-CM

## 2017-06-19 DIAGNOSIS — J910 Malignant pleural effusion: Secondary | ICD-10-CM

## 2017-06-19 DIAGNOSIS — E440 Moderate protein-calorie malnutrition: Secondary | ICD-10-CM | POA: Diagnosis not present

## 2017-06-19 DIAGNOSIS — G893 Neoplasm related pain (acute) (chronic): Secondary | ICD-10-CM

## 2017-06-19 DIAGNOSIS — R630 Anorexia: Secondary | ICD-10-CM

## 2017-06-19 DIAGNOSIS — C384 Malignant neoplasm of pleura: Secondary | ICD-10-CM | POA: Diagnosis present

## 2017-06-19 DIAGNOSIS — C3482 Malignant neoplasm of overlapping sites of left bronchus and lung: Secondary | ICD-10-CM

## 2017-06-19 NOTE — Telephone Encounter (Signed)
Per Dr. Irene Limbo, hospice referral made to [facility] ([place]).  SW [place] in referrals.  Pt will be contacted to set up apt.

## 2017-06-19 NOTE — Patient Instructions (Signed)
Thank you for choosing [facility] to provide your oncology and hematology care.  To afford each patient quality time with our providers, please arrive 30 minutes before your scheduled appointment time.  If you arrive late for your appointment, you may be asked to reschedule.  We strive to give you quality time with our providers, and arriving late affects you and other patients whose appointments are after yours.   If you are a no show for multiple scheduled visits, you may be dismissed from the clinic at the providers discretion.    Again, thank you for choosing [facility], our hope is that these requests will decrease the amount of time that you wait before being seen by our physicians.  ______________________________________________________________________  Should you have questions after your visit to the [facility], please contact our office at [phone] between the hours of 8:30 and 4:30 p.m.    Voicemails left after 4:30p.m will not be returned until the following business day.    For prescription refill requests, please have your pharmacy contact us directly.  Please also try to allow 48 hours for prescription requests.    Please contact the scheduling department for questions regarding scheduling.  For scheduling of procedures such as PET scans, CT scans, MRI, Ultrasound, etc please contact central scheduling at [phone].    Resources For Cancer Patients and Caregivers:   Oncolink.org:  A wonderful resource for patients and healthcare providers for information regarding your disease, ways to tract your treatment, what to expect, etc.     American Cancer Society:  800-227-2345  Can help patients locate various types of support and financial assistance  Cancer Care: 1-800-813-HOPE (4673) Provides financial assistance, online support groups, medication/co-pay assistance.    [place] DSS:  336-641-3447 Where to apply for food  stamps, Medicaid, and utility assistance  Medicare Rights Center: 800-333-4114 Helps people with Medicare understand their rights and benefits, navigate the Medicare system, and secure the quality healthcare they deserve  SCAT: 336-333-6589 [place] [employer]'s shared-ride transportation service for eligible riders who have a disability that prevents them from riding the fixed route bus.    For additional information on assistance programs please contact our social worker:   Grier Hock/Abigail Elmore:  [phone]            

## 2017-06-19 NOTE — Telephone Encounter (Signed)
No additional appts scheduled per 8/21 los - Home hospice referral to [facility] and palliative care RTC with Dr Irene Limbo on an as needed basis  Faxed information over to  [employer] in referrals at  [facility] and palliative care

## 2017-06-25 NOTE — Progress Notes (Signed)
[name]  HEMATOLOGY ONCOLOGY PROGRESS NOTE  Date of service: .06/19/2017  Patient Care Team: System, Provider Not In as PCP - General  CC: f/u for metastatic Non small cell lung cancer  Diagnosis: Metastatic lung adenocarcinoma (with M1a disease - pleural nodules and malignant pleural effusion)  Current Treatment:   Atezolizumab q3weeks started on 03/27/2017  Previous Treatment:  Carboplatin + Alimta s/p 3 cycles.  HPI  Joshua White is a 66 y.o. male who has been referred to [place] by Dr Aldine Contes, MD for evaluation and management of likely metastatic lung cancer.  Patient has a history of seizures, G6PD deficiency, right bundle branch block , heavy smoking history and hepatitis C and is just about done with his 12 week treatment with Harvoni for his hepatitis C. Patient notes that he has had progressive fatigue and anorexia for the last 2-3 months. He initially assigned his symptoms to his treatment for hepatitis C with Harvoni but was concerned when the symptoms persisted. He notes he has lost about 15-20 pounds over the last couple of months. She presented to the emergency room about a week ago with significant shortness of breath for 2-3 weeks  and was noted to have a large pleural effusion and had a therapeutic thoracentesis with removal of 1 L fluid which was sent for analysis. It was noted to be exudative with cytology concerning for atypical cells which are likely malignant.  Patient presented to his primary care physician in follow-up and was noted to have significant shortness of breath again and recurrent large left-sided pleural effusion for which she was admitted. He had a CT of the chest which showed an extremely large left-sided pleural effusion occupying majority of the left hemithorax with dense consolidation of most of the left lung. Diffuse infiltrating mass along the left side of the mediastinum measuring 3.6 cm and extending into much of the pleura and left hemithorax  . He was also noted to have mediastinal lymphadenopathy and a 2.1 cm nodular opacity in the right lung . Concern for possible metastases in the epicardial fat pad . Also had mildly prominent retroperitoneal lymph nodes measuring about 8 mm which were nonspecific . CT abdomen showed no other obvious metastatic disease.  Patient had an MRI of the brain which shows no evidence of metastatic disease. 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  Patient has had a repeat therapeutic thoracentesis today cytology from this is currently pending. Cardiothoracic surgery was consulted for his recurrent large pleural effusion and there are plans for a VATS procedure with pleural biopsy and possible placement of a Pleurx catheter on 12/20/2016.  Patient notes his breathing is somewhat better after the thoracentesis. Has some left sided chest pain.  He notes that he smoked about 1-2 packs a day since age 16 and quit about a year ago. No headaches no focal neurological deficits. No abdominal pain or distention. Weight loss of about 20 pounds over the last couple months.  Pathology results showed poorly differentiated lung adenocarcinoma.  PDL1 testing 0% Other foundation One results pending  INTERVAL HISTORY:  Patient is here for follow-up of his progressive metastatic non-small cell lung cancer. He has lost about 18 pounds over the last 3 weeks . Notes anorexia . No overt increase in shortness of breath. After his last clinic visit he and his family have had a detailed discussion and he has reasonably chosen to forego additional palliative chemotherapy. He notes that he would like  to spend his energy and time with his family and pursue best supportive care through hospice. With his permission a referral was placed to [facility] and palliative care for enrollment. We discussed that we shall be available in case he has any questions or concerns. Patient was  very grateful for the care he has received with our clinic. All of his lingering questions and concerns were answered. We ensured that he had adequate pain medication and other medications for symptom control pending enrollment in hospice.  REVIEW OF SYSTEMS:    10 Point review of systems of done and is negative except as noted above.  . Past Medical History:  Diagnosis Date  . G6PD deficiency ([place])   . H/O orthostatic hypotension   . Lung cancer ([place]) dx'd 01/2017  . Pneumonia   . RBBB (right bundle branch block)   . Seizures ([place])     . Past Surgical History:  Procedure Laterality Date  . APPENDECTOMY    . CHEST TUBE INSERTION Left 12/20/2016   Procedure: INSERTION PLEURAL DRAINAGE CATHETER;  Surgeon: Grace Isaac, MD;  Location: [place];  Service: Thoracic;  Laterality: Left;  . PLEURAL BIOPSY Left 12/20/2016   Procedure: PLEURAL BIOPSY;  Surgeon: Grace Isaac, MD;  Location: [place];  Service: Thoracic;  Laterality: Left;  . US ECHOCARDIOGRAPHY  05/12/2008   EF 55-60%  . VIDEO ASSISTED THORACOSCOPY (VATS) W/TALC PLEUADESIS Left 12/20/2016   Procedure: VIDEO ASSISTED THORACOSCOPY (VATS) W/TALC PLEURADESIS;  Surgeon: Grace Isaac, MD;  Location: [place];  Service: Thoracic;  Laterality: Left;  [name] VIDEO BRONCHOSCOPY N/A 12/20/2016   Procedure: VIDEO BRONCHOSCOPY;  Surgeon: Grace Isaac, MD;  Location[facility];  Service: Thoracic;  Laterality: N/A;    . Social History  Substance Use Topics  . Smoking status: Former Smoker    Packs/day: 2.00    Years: 30.00    Types: Cigars  . Smokeless tobacco: Never Used     Comment: Quit ~2017  . Alcohol use No     Comment: Former use    ALLERGIES:  is allergic to tuberculin purified protein derivative and sulfa antibiotics.  MEDICATIONS:  Current Outpatient Prescriptions  Medication Sig Dispense Refill  . aspirin EC 81 MG tablet Take 81 mg by mouth daily.    [name] dexamethasone (DECADRON) 4 MG tablet Take 1 tablet (4 mg total)  by mouth 2 (two) times daily. 10 tablet 0  . dronabinol (MARINOL) 5 MG capsule Take 1 capsule (5 mg total) by mouth 2 (two) times daily before a meal. 60 capsule 0  . morphine (MS CONTIN) 60 MG 12 hr tablet Take 2 tablets (120 mg total) by mouth every 8 (eight) hours. 180 tablet 0  . ondansetron (ZOFRAN) 8 MG tablet Take 1 tablet (8 mg total) by mouth 2 (two) times daily as needed for refractory nausea / vomiting. Start on day 3 after chemo. 30 tablet 1  . Oxycodone HCl 10 MG TABS Take 1-2 tablets (10-20 mg total) by mouth every 4 (four) hours as needed. 129 tablet 0  . polyethylene glycol (MIRALAX) packet Take 17 g by mouth 2 (two) times daily. 60 each 1  . prochlorperazine (COMPAZINE) 10 MG tablet Take 1 tablet (10 mg total) by mouth every 6 (six) hours as needed (Nausea or vomiting). 30 tablet 1  . senna-docusate (SENNA S) 8.6-50 MG tablet Take 3 tablets by mouth 2 (two) times daily. 180 tablet 2   No current facility-administered medications for this visit.  PHYSICAL EXAMINATION: ECOG PERFORMANCE STATUS: 2 - Symptomatic, <50% confined to bed  . Vitals:   06/19/17 1144  BP: 133/84  Pulse: (!) 124  Resp: 16  Temp: 97.7 F (36.5 C)  SpO2: 94%    Filed Weights   06/19/17 1144  Weight: 107 lb 6.4 oz (48.7 kg)   .Body mass index is 15.86 kg/m. . Wt Readings from Last 3 Encounters:  06/19/17 107 lb 6.4 oz (48.7 kg)  05/29/17 125 lb 4.8 oz (56.8 kg)  05/08/17 128 lb 11.2 oz (58.4 kg)    GENERAL:alert, in no acute distress and comfortable SKIN: no acute rashes, no significant lesions EYES: conjunctiva are pink and non-injected, sclera anicteric OROPHARYNX: MMM, no exudates, no oropharyngeal erythema or ulceration NECK: supple, no JVD LYMPH:  no palpable lymphadenopathy in the cervical, axillary or inguinal regions LUNGS:Decreased at entry left lung base. HEART: regular rate & rhythm ABDOMEN:  normoactive bowel sounds , non tender, not distended. Extremity: no pedal  edema PSYCH: alert & oriented x 3 with fluent speech NEURO: no focal motor/sensory deficits  LABORATORY DATA:   I have reviewed the data as listed  CBC Latest Ref Rng & Units 05/29/2017 05/08/2017 04/17/2017  WBC 4.0 - 10.3 10e3/uL 5.8 5.3 4.4  Hemoglobin 13.0 - 17.1 g/dL 9.5(L) 10.6(L) 9.0(L)  Hematocrit 38.4 - 49.9 % 29.6(L) 32.0(L) 28.4(L)  Platelets 140 - 400 10e3/uL 333 307 306    . CMP Latest Ref Rng & Units 05/29/2017 05/08/2017 04/17/2017  Glucose 70 - 140 mg/dl 110 104 84  BUN 7.0 - 26.0 mg/dL 14.1 12.5 12.8  Creatinine 0.7 - 1.3 mg/dL 0.6(L) 0.8 0.8  Sodium 136 - 145 mEq/L 133(L) 132(L) 135(L)  Potassium 3.5 - 5.1 mEq/L 4.2 4.6 4.3  Chloride 101 - 111 mmol/L - - -  CO2 22 - 29 mEq/L 24 26 25   Calcium 8.4 - 10.4 mg/dL 8.1(L) 11.4(H) 10.5(H)  Total Protein 6.4 - 8.3 g/dL 7.3 8.7(H) 8.7(H)  Total Bilirubin 0.20 - 1.20 mg/dL 0.51 0.58 0.54  Alkaline Phos 40 - 150 U/L 70 77 74  AST 5 - 34 U/L 82(H) 44(H) 19  ALT 0 - 55 U/L 54 19 9           RADIOGRAPHIC STUDIES: I have personally reviewed the radiological images as listed and agreed with the findings in the report. No results found.  ASSESSMENT & PLAN:    66 year old male with  #1 Metastatic lung adenocarcinoma (with M1a disease - pleural nodules and malignant pleural effusion) PDL1 0%  Negative for EGFR, ALK, ROS1 and BRAF mutations. Patient is now status post 3 cycles of palliative carboplatin and Alimta chemotherapy which he has tolerated well. CT chest abdomen pelvis 03/13/2017 shows concern for disease progression in the left lung. Patient tolerated the 3 cycles of Atezolizumab well without any acute concerns. CT chest abdomen pelvis on 05/25/2017 shows evidence of disease progression as noted above  #2 Recurrent left-sided malignant pleural effusion -now s/p pleurex catheter placement and drainage.   Plan -We had another detail goals of care discussion. -We discussed third line treatment options  including single agent docetaxel versus gemcitabine or consideration of best supportive care's through hospice. Patient is very clear that he would like to hold off on any additional palliative treatments and would like to pursue best supportive care through hospice. Patient's son and daughter weret present for the discussion -With the patient's permission a hospice enrollment referral was sent to [facility] and palliative care for hospice  enrollment.  #4 ex-smoker with more than 100-pack-year history of smoking  -he has quit smoking about a year ago.  #5 exalcohol abuse and remote IVDU- patient notes no recent IV drug use and has been sober from alcohol use from a year.  #6 hepatitis C has finished treatment with harvonisoon before being diagnosed with lung cancer.  #5 moderate protein calorie malnutrition and anorexia due to malignancy and recent treatment for hepatitis C. . Wt Readings from Last 3 Encounters:  06/19/17 107 lb 6.4 oz (48.7 kg)  05/29/17 125 lb 4.8 oz (56.8 kg)  05/08/17 128 lb 11.2 oz (58.4 kg)   -continue on Marinol 5 mg by mouth twice a day -Patient encouraged to increase his by mouth food intake.   #6 cancer related pain - better controlled -continue MS contin to 18m po q8h for pain and DOE/SOB. -Oxycodone every 4 hours as needed for breakthrough pain -Senna S and MiraLAX for bowel prophylaxis.   #7 [place] anemia- stable  #8 Thrombocytopenia - resolved platelet counts are normal today  #9 hypercalcemia of malignancy receive Zometa 3 weeks ago.  No new labs today  [facility] Dr KIrene Limboon an as needed basis   I spent 30 minutes counseling the patient face to face. The total time spent in the appointment was 40 minutes and more than 50% was on counseling and direct patient cares.    GSullivan LoneMD [email or web address] SNoland Hospital [facility]cal[facility], LLCCPam [facility]ty Hospital Of Corp[facility] Christi BayfrontHematology/Oncology Physician [facility] (Office):        [phone](Work cell):  [phone](Fax):           [phone]

## 2017-06-27 ENCOUNTER — Other Ambulatory Visit: Payer: Self-pay

## 2017-06-27 ENCOUNTER — Telehealth: Payer: Self-pay

## 2017-06-27 DIAGNOSIS — G893 Neoplasm related pain (acute) (chronic): Secondary | ICD-10-CM

## 2017-06-27 MED ORDER — MORPHINE SULFATE ER 60 MG PO TBCR: 120.0000 mg | EXTENDED_RELEASE_TABLET | Freq: Three times a day (TID) | ORAL | 0 refills | Status: AC

## 2017-06-27 MED ORDER — OXYCODONE HCL 10 MG PO TABS: 10.0000 mg | ORAL_TABLET | ORAL | 0 refills | Status: AC | PRN

## 2017-06-27 NOTE — Telephone Encounter (Signed)
Spoke with RN Coordinator through Hospice who is helping oversee pt's case. Plan to meet tomorrow to review and Hospice physician will take over prescription responsibility after learning more about the pt's case. Refill for morphine and oxycodone signed. Spoke with pt son. Will p/u prescriptions tomorrow morning. Told son to contact Hospice with future prescription needs. Pt son verbalized thanks for communication and understanding of who to contact regarding care.

## 2017-06-29 ENCOUNTER — Telehealth: Payer: Self-pay

## 2017-06-29 NOTE — Telephone Encounter (Signed)
Received call from nurse assisting in care of pt. Pt called to request pain medication and nurse wanted to know if it had been prescribed by our physician or not. Talked to pt son on Wednesday and told him that prescriptions for oxycodone and morphine could be picked up Thursday (8/30). Checked in the hard copy medication notebook and confirmed that son had picked up medication for patient. Hospice to take over medication management at this time.

## 2017-07-03 ENCOUNTER — Telehealth: Payer: Self-pay | Admitting: *Deleted

## 2017-07-03 ENCOUNTER — Telehealth: Payer: Self-pay

## 2017-07-03 NOTE — Telephone Encounter (Signed)
[place] closed 07-02-2017 for Labor Day Holiday. Voicemail received requesting "return call in reference to my dad, [phone]" returned today.  No further message with  This call.     "My Dad will run out if pain medicine at 10:00 am today.  We need a refill MS Contin and Oxycodone. He needs the dose increased and he needs more than (90) ninety pills.  This only last a week.  He takes (2)two MS Contin every four hours and one Oxycodone.  This was not holding for four hours so we added the second oxycodone an hour after he takes these.  He's under hospice, a nurse will come today at 10:00.  We'll talk to them today as well.  I've called Hospice and the [place] since Saturday to get medicine.  We were told by Dr. Irene Limbo to call the cancer center for whatever we need."

## 2017-07-03 NOTE — Telephone Encounter (Signed)
Paged for prescription pick up.  Prescription present in Schedule II prescription book dated 06-27-2017 for MS Contin 60 mg take two tablets every eight hours, qty 180.  Son states being told this needed to be rewritten and that something stronger is needed.  (see previous note.)  This nurse spoke with Dr. Irene Limbo.  Verbal order received and read back from Dr.Kale for MS Contin 60 mg take two pills (120 mg) every eight hours to remain the same.  No change with oxycodone.  Call Hospice if any new orders.  Order given to son at this time.    Son expressed he returned prescription provided today, was accused of taking his Dad's medications.  He is in pain to his stomach and back.  What am I to do if he is in pain?  Reviewed how to take MS Contin every eight hours, three times a day.  Able to take one to two oxycodone 10 mg every four hours (six times a day) for breakthrough pain between the eight hours of long acting. Call Hospice for any needs.  These pain refill orders can be faxed for Hospice patients to pharmacy of choice.  This nurse called Hospice to notify them of above information, orders and to expect call for any unresolved pain for further assessment.

## 2017-07-03 NOTE — Telephone Encounter (Signed)
Nurse from Hospice called inquiring about recent prescriptions given to pt for oxycodone and morphine. Shared dosage of both and quantity so that physician at [facility] would be aware that pt does not need refills at this time. RN verbalized understanding and thanks. I also added that pt has two sons who help with his care, and it is possible that they have not communicated with one another about the pain medications.

## 2017-07-26 ENCOUNTER — Telehealth: Payer: Self-pay

## 2017-07-26 NOTE — Telephone Encounter (Signed)
Certificate of Death and and medication changes signed by Dr. Irene Limbo and faxed to Medical Records at [facility] and [place] [phone]. Confirmed fax receipt 07/26/17 at 1026.

## 2017-07-30 DEATH — deceased

## 2017-10-03 ENCOUNTER — Other Ambulatory Visit: Payer: Self-pay | Admitting: Nurse Practitioner

## 2018-12-29 IMAGING — CR DG CHEST 1V
1 series · 1 of 1 positions shown · non-contrast
Comparison: Two-view chest x-ray earlier today [DATE] a.m..
02/23/2011.

CLINICAL DATA: 65-year-old who underwent left thoracentesis earlier
this afternoon with removal of 1.3 L of fluid. The thoracentesis was
discontinued due to chest pain.

EXAM:
CHEST 1 VIEW [DATE] p.m.:

[chest ap]
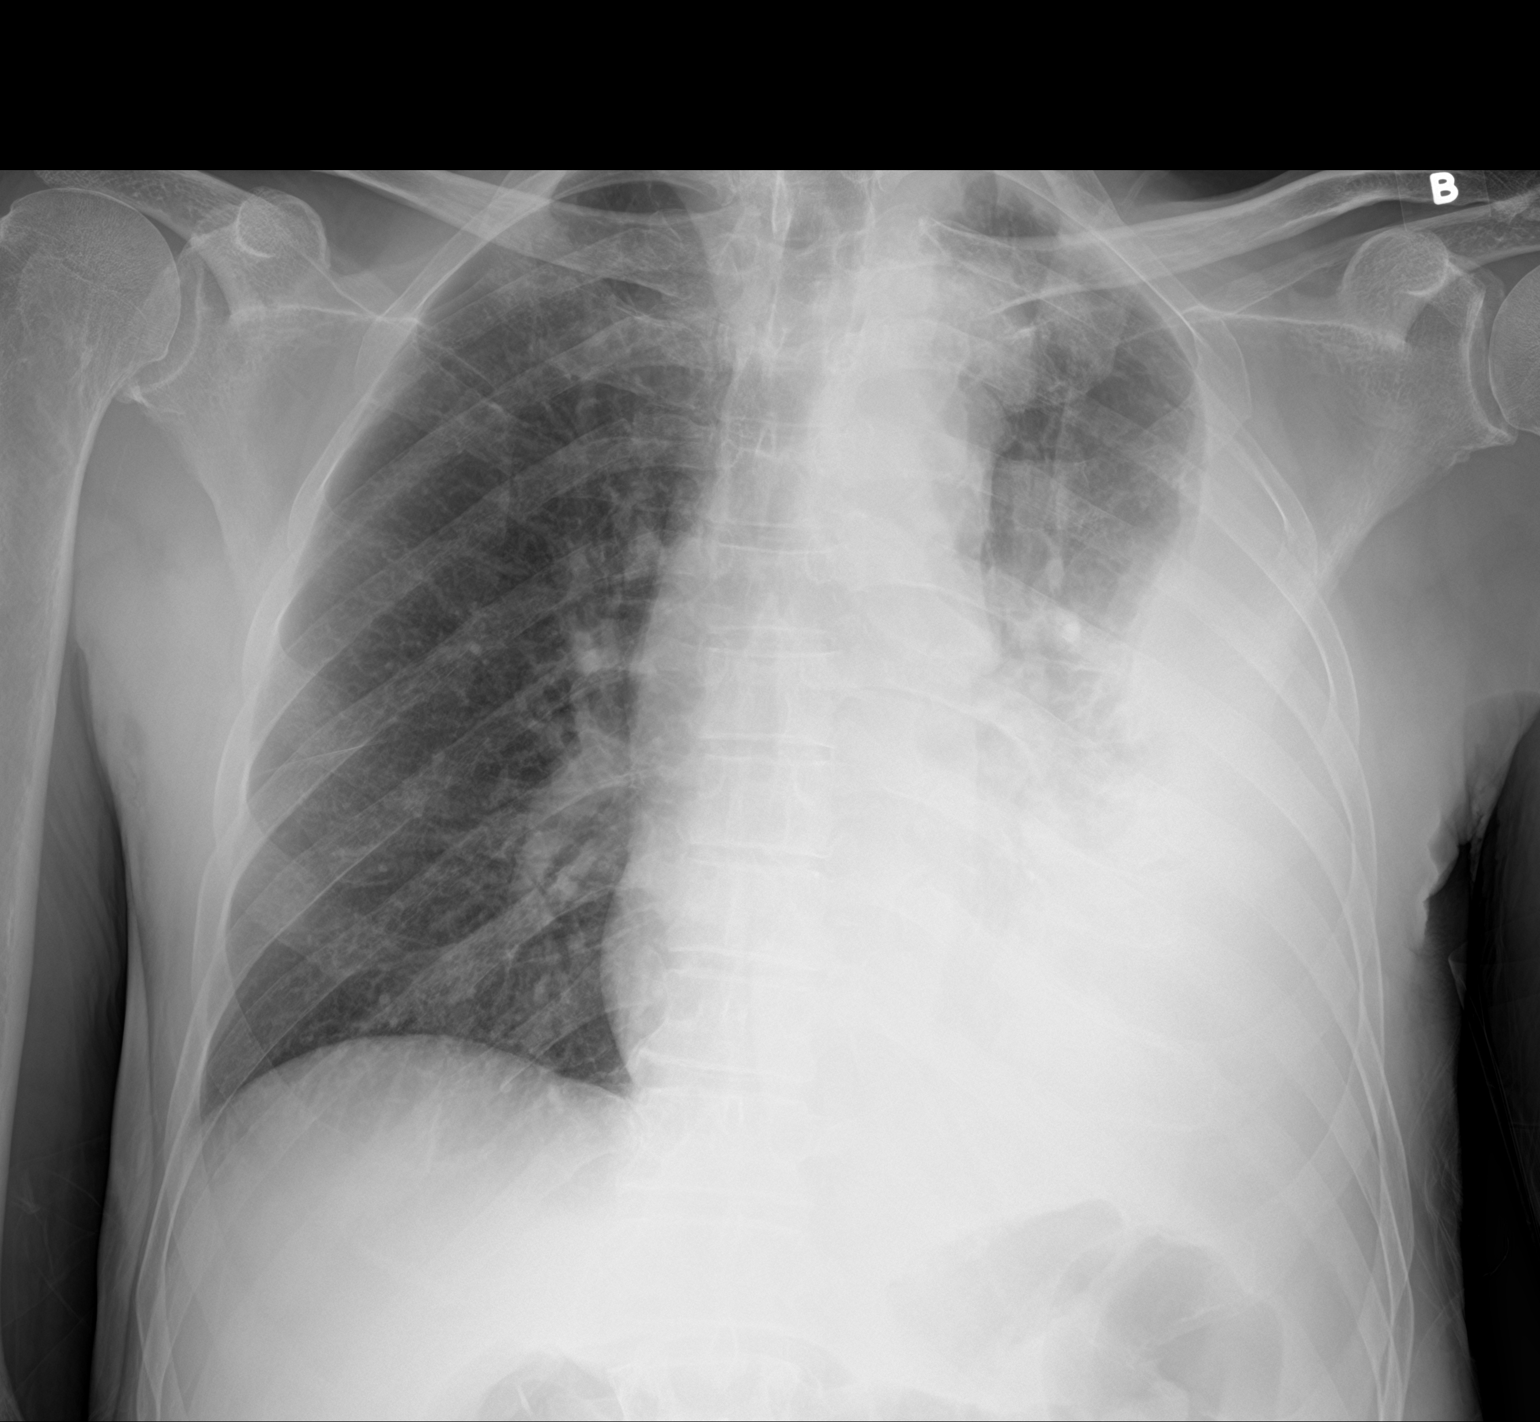

[1 of 1 positions shown; findings below may reference images not displayed]

FINDINGS: Interval decrease in size of the still large left pleural effusion.
No pneumothorax. Slight improved aeration in the left upper lobe,
though dense passive atelectasis and/or pneumonia persists in the
left lower lobe and lingula. Right lung remains clear.
IMPRESSION: 1. No pneumothorax after left thoracentesis.
2. Interval decrease in size of the still large left pleural
effusion since earlier today.
3. Slight improved aeration in the left upper lobe, though dense
passive atelectasis and/or pneumonia persists in the left lower lobe
and lingula.

## 2019-01-10 IMAGING — CR DG CHEST 1V PORT
1 series · 1 of 1 positions shown · non-contrast
Comparison: 12/22/2016 and prior exams

CLINICAL DATA: Left VATS with talc pleurodesis.

EXAM:
PORTABLE CHEST 1 VIEW

[AP]
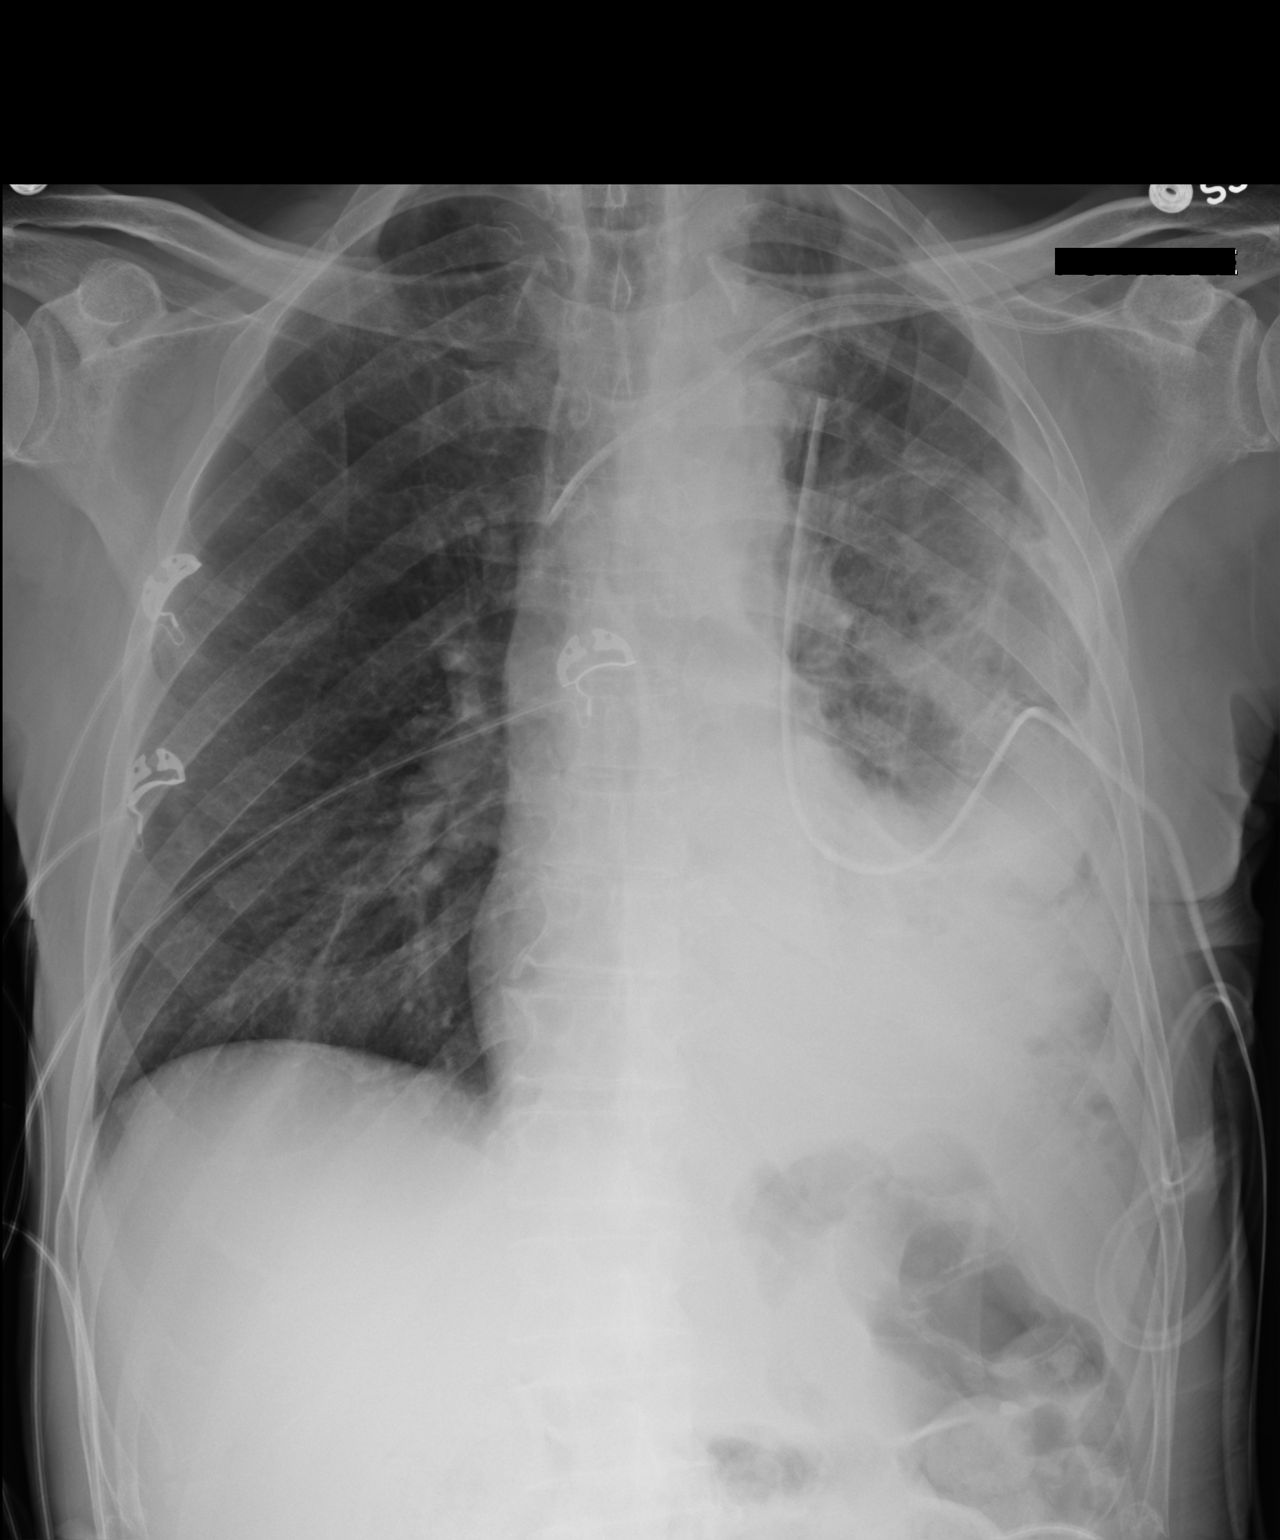

[1 of 1 positions shown; findings below may reference images not displayed]

FINDINGS: A left subclavian central venous catheter with tip overlying the
proximal SVC and left thoracostomy tube again noted.

Postsurgical changes and atelectasis within the left mid and lower
lung again noted.

There is no evidence of pneumothorax. Left pleural thickening/fluid
and superior left paramediastinal density are unchanged.
IMPRESSION: Unchanged appearance of the chest as described above. No evidence of
pneumothorax.

## 2019-03-31 IMAGING — CT CT ABD-PELV W/ CM
2 of 5 series · 13 of 46 positions shown, 15 images · IV contrast (ISOVUE 300)
Comparison: Multiple exams, including 12/18/2016

CLINICAL DATA: Chest pain, cough, and shortness of breath for 1
month. Restaging of metastatic lung cancer. Prior talc pleurodesis.

EXAM:
CT CHEST, ABDOMEN, AND PELVIS WITH CONTRAST
TECHNIQUE: Multidetector CT imaging of the chest, abdomen and pelvis was
performed following the standard protocol during bolus
administration of intravenous contrast.
CONTRAST:  100mL D2TDGJ-DTT IOPAMIDOL (D2TDGJ-DTT) INJECTION 61%

[Series 2: cap with · axial · 0.71mm/px · z∈[-612,-77]mm · 10 of 131 slices shown, 12 images]
[im 12/131  soft-tissue]
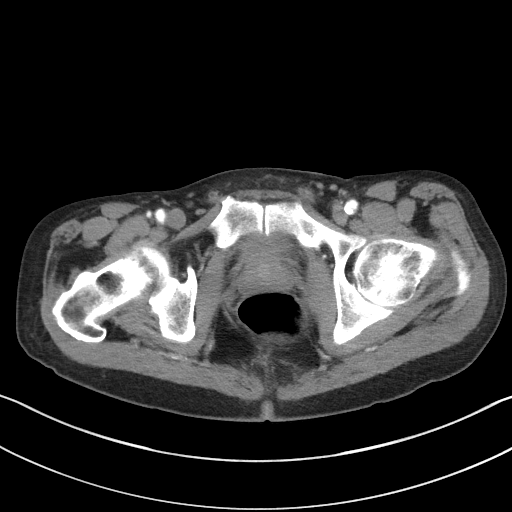
[im 12/131  bone]
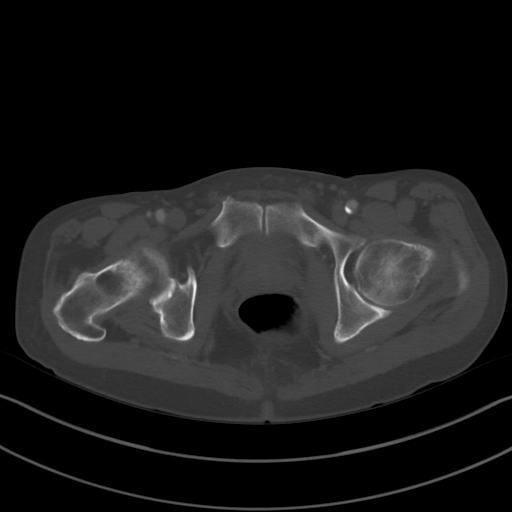
[im 24/131  soft-tissue]
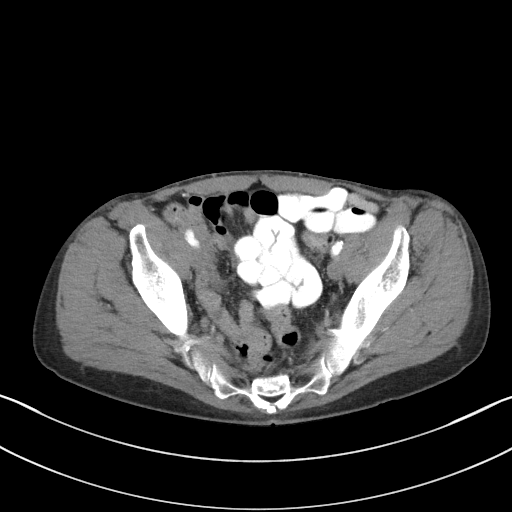
[im 36/131  soft-tissue]
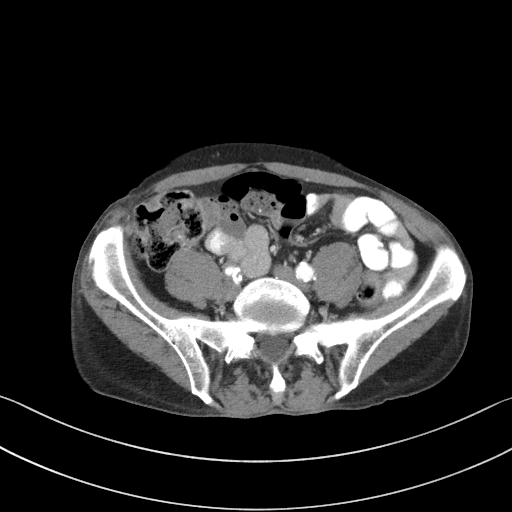
[im 48/131  soft-tissue]
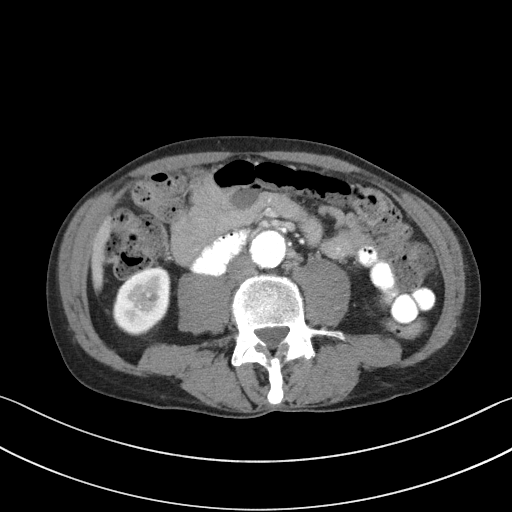
[im 60/131  soft-tissue]
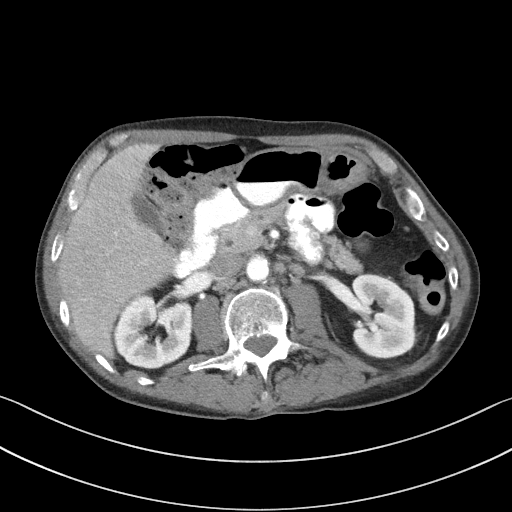
[im 71/131  soft-tissue]
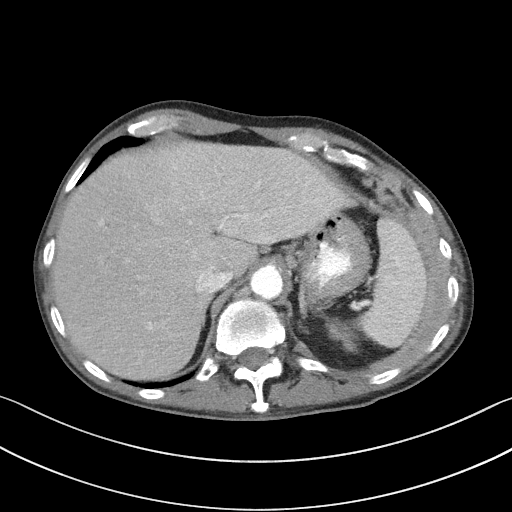
[im 83/131  soft-tissue]
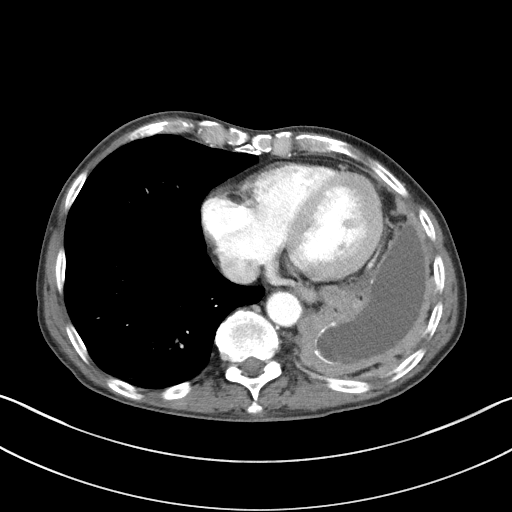
[im 95/131  soft-tissue]
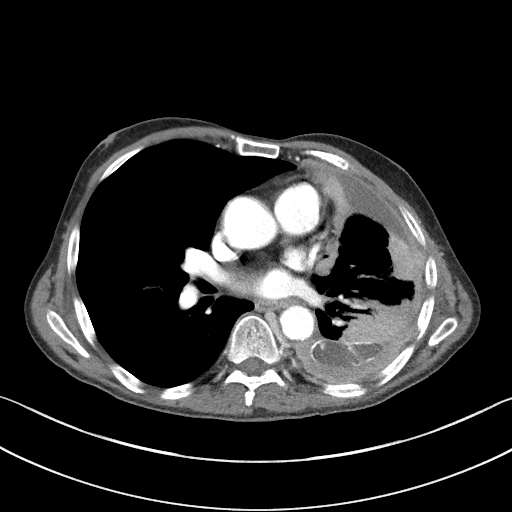
[im 107/131  soft-tissue]
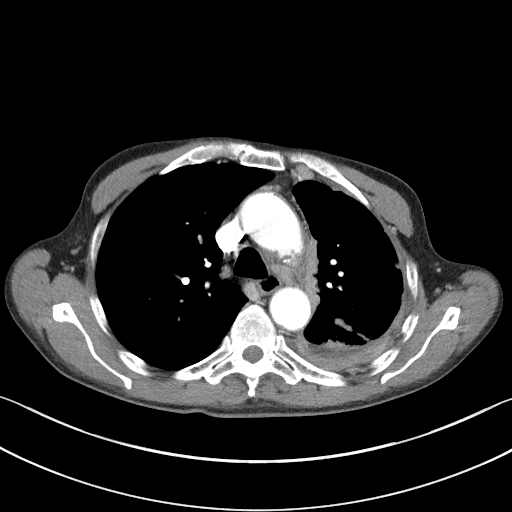
[im 107/131  bone]
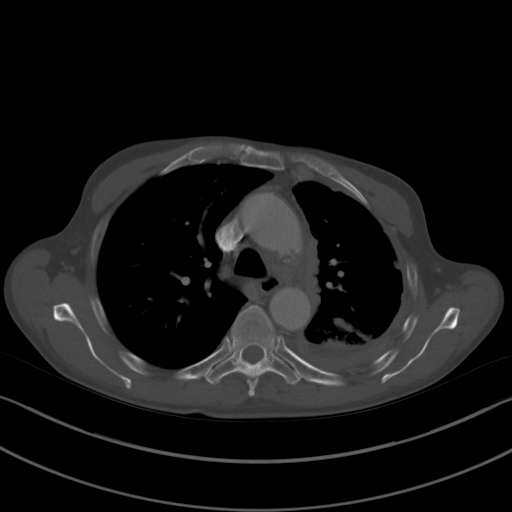
[im 119/131  soft-tissue]
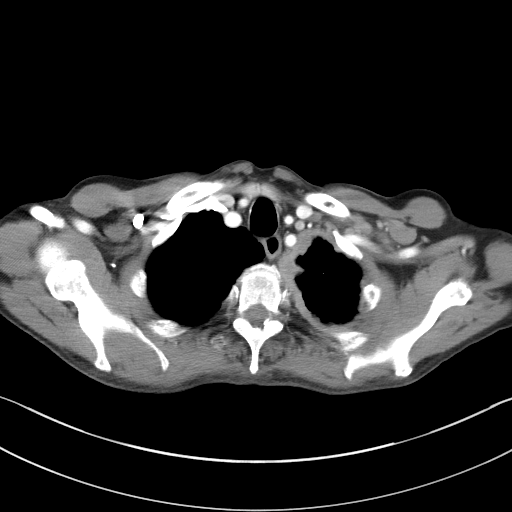

[Series 5: coronals · coronal · 0.80mm/px · 3 of 142 slices shown]
[im 48/142  soft-tissue]
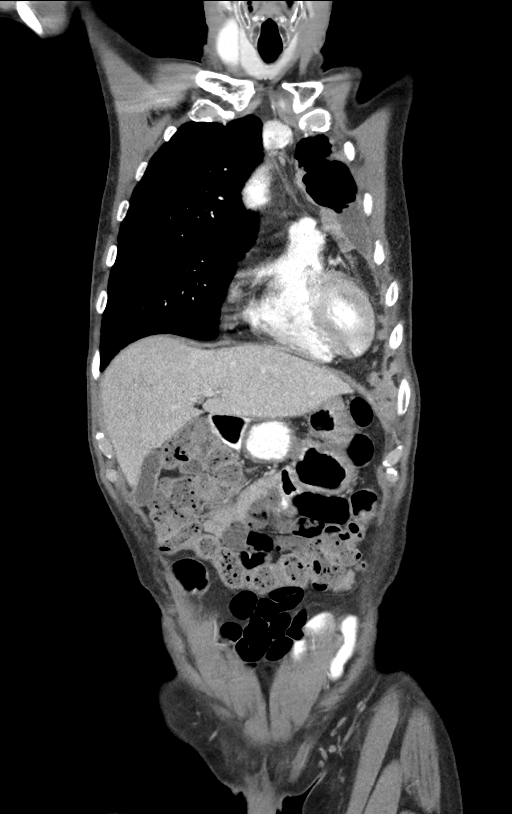
[im 63/142  soft-tissue]
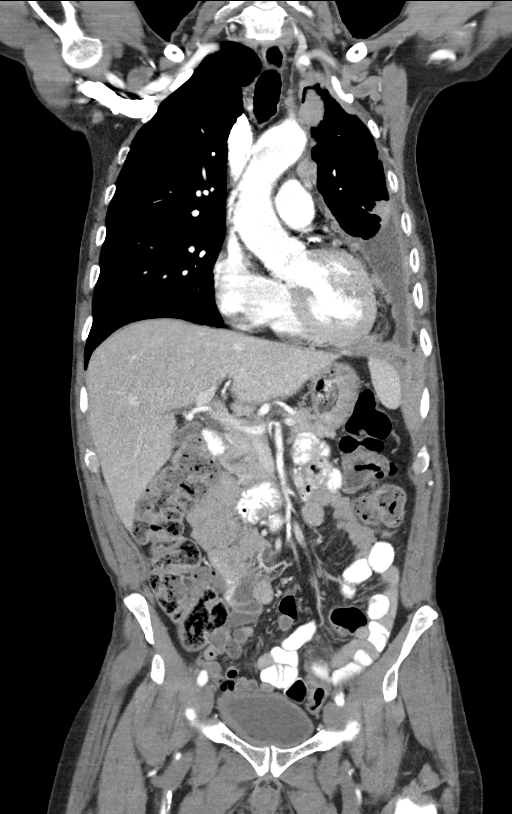
[im 79/142  soft-tissue]
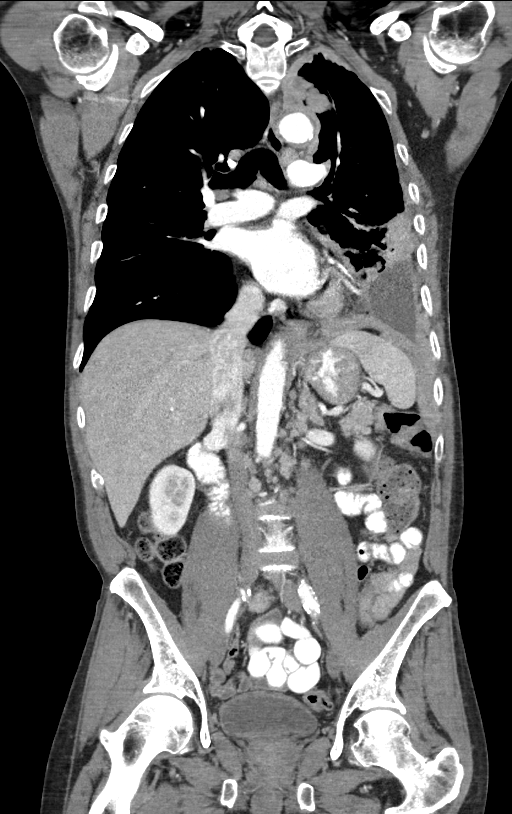

[13 of 46 positions shown; findings below may reference images not displayed]

FINDINGS: CT CHEST FINDINGS

Cardiovascular: Coronary and aortic arch atherosclerotic
calcification. Mild ascending aortic aneurysm at 4.0 cm, stable.
Mild calcification of the leaflets of the aortic valve.

Mediastinum/Nodes: AP window lymph node 1.3 cm in short axis,
previously 1.6 cm. Adjacent 1.2 cm AP window lymph node on image
[DATE], previously 1.0 cm. Right lower paratracheal node 0.9 cm in
short axis on image [DATE]. Lower paraesophageal lymph nodes are
present and there is a pericardial node near the cardiac apex
measuring 1.1 cm in short axis on image 56/2, essentially stable.
Abnormal nodularity in the pericardial adipose tissue confluent with
the pleural rind.

Lungs/Pleura: Increase in thickness of the left pleural rind. This
includes the basal all and parietal pleura and has some talc
deposition along the margin which is new. The pleural rind is
irregular with a lobulated border along adjacent pleural adipose
tissue, and a masslike component of the pleural rind in the
paramediastinal location measures 4.1 by 3.5 cm, formerly 3.6 by
cm. Reduced volume of the left pleural effusion but with
considerable continued volume loss is specially in the left lower
lobe.

Paraseptal and a less degree of centrilobular emphysema. There is
some minimal tree-in-bud opacity in the right upper lobe and right
middle lobe probably from atypical infectious bronchiolitis. Right
lower lobe scarring, right lower lobe atelectasis is improved from
prior.

Musculoskeletal: Unremarkable

CT ABDOMEN PELVIS FINDINGS

Hepatobiliary: Contracted gallbladder.  Otherwise unremarkable.

Pancreas: Pancreas divisum.

Spleen: Unremarkable

Adrenals/Urinary Tract: Small hypodense lesions the right kidney
lower pole are likely cysts but technically too small to
characterize.

Stomach/Bowel: Prominent stool throughout the colon favors
constipation. Sigmoid colon diverticulosis.

Vascular/Lymphatic: Aortoiliac atherosclerotic vascular disease.
Fusiform infrarenal abdominal aortic ectasia at 2.7 cm.

There is gastrohepatic ligament, retrocrural, and periaortic
adenopathy. An index left periaortic node measures 1 cm in short
axis on image 75/2, formerly the same.

Reproductive: Unremarkable

Other: No supplemental non-categorized findings.

Musculoskeletal: Bridging spurring of both sacroiliac joints. Mild
spurring of both hips.
IMPRESSION: 1. Improved aeration in the left lower lobe, with reduced size of
the left pleural effusion. That said, the pleural rind demonstrates
increase thickening and nodularity, with the dominant pleural
paramediastinal tumor on the left significantly enlarged in size.
Evidence of interval type pleurodesis. On balance, the amount of
adenopathy in the chest and abdomen appears stable compared to
prior.
2. Mild ascending aortic aneurysm at 4.0 cm. Recommend annual
imaging followup by CTA or MRA. This recommendation follows 0535
ACCF/AHA/AATS/ACR/ASA/SCA/ABEIRO/MALLAMAS/POLIN/JOSHJAX Guidelines for the
Diagnosis and Management of Patients with Thoracic Aortic Disease.
Circulation. 0535; 121: [id number]
3. Fusiform infrarenal abdominal aortic ectasia.
4. Coronary atherosclerosis. Aortic Atherosclerosis (ICPJN-XOQ.Q)
and Emphysema ([id number]-M0S.X).
5. Pancreas divisum.
